# Patient Record
Sex: Female | Born: 1945
Health system: Southern US, Community
[De-identification: ages and names within clinical notes are randomized; demographics above are authoritative.]

## PROBLEM LIST (undated history)

## (undated) DIAGNOSIS — J45909 Unspecified asthma, uncomplicated: Secondary | ICD-10-CM

## (undated) DIAGNOSIS — E119 Type 2 diabetes mellitus without complications: Secondary | ICD-10-CM

## (undated) DIAGNOSIS — R600 Localized edema: Secondary | ICD-10-CM

## (undated) DIAGNOSIS — R7303 Prediabetes: Secondary | ICD-10-CM

## (undated) DIAGNOSIS — G2581 Restless legs syndrome: Secondary | ICD-10-CM

## (undated) DIAGNOSIS — E785 Hyperlipidemia, unspecified: Secondary | ICD-10-CM

## (undated) DIAGNOSIS — I10 Essential (primary) hypertension: Secondary | ICD-10-CM

## (undated) DIAGNOSIS — F419 Anxiety disorder, unspecified: Secondary | ICD-10-CM

## (undated) DIAGNOSIS — G35 Multiple sclerosis: Secondary | ICD-10-CM

## (undated) HISTORY — PX: OTHER SURGICAL HISTORY: SHX169

## (undated) HISTORY — DX: Localized edema: R60.0

## (undated) HISTORY — DX: Type 2 diabetes mellitus without complications: E11.9

## (undated) HISTORY — DX: Unspecified asthma, uncomplicated: J45.909

## (undated) HISTORY — DX: Hyperlipidemia, unspecified: E78.5

## (undated) HISTORY — DX: Restless legs syndrome: G25.81

## (undated) HISTORY — DX: Anxiety disorder, unspecified: F41.9

## (undated) HISTORY — DX: Prediabetes: R73.03

## (undated) HISTORY — DX: Multiple sclerosis: G35

---

## 1980-03-11 HISTORY — PX: TUBAL LIGATION: SHX77

## 1998-02-15 ENCOUNTER — Encounter: Payer: Self-pay | Admitting: Emergency Medicine

## 1998-02-15 ENCOUNTER — Inpatient Hospital Stay (HOSPITAL_COMMUNITY): Admission: EM | Admit: 1998-02-15 | Discharge: 1998-02-18 | Payer: Self-pay | Admitting: Emergency Medicine

## 1998-02-28 ENCOUNTER — Ambulatory Visit (HOSPITAL_COMMUNITY): Admission: RE | Admit: 1998-02-28 | Discharge: 1998-02-28 | Payer: Self-pay | Admitting: *Deleted

## 1998-11-25 ENCOUNTER — Encounter: Payer: Self-pay | Admitting: Emergency Medicine

## 1998-11-25 ENCOUNTER — Emergency Department (HOSPITAL_COMMUNITY): Admission: EM | Admit: 1998-11-25 | Discharge: 1998-11-25 | Payer: Self-pay | Admitting: Emergency Medicine

## 1999-04-09 ENCOUNTER — Encounter: Payer: Self-pay | Admitting: Orthopedic Surgery

## 1999-04-09 ENCOUNTER — Ambulatory Visit (HOSPITAL_COMMUNITY): Admission: RE | Admit: 1999-04-09 | Discharge: 1999-04-09 | Payer: Self-pay | Admitting: Orthopedic Surgery

## 1999-09-03 ENCOUNTER — Encounter: Admission: RE | Admit: 1999-09-03 | Discharge: 1999-09-03 | Payer: Self-pay | Admitting: *Deleted

## 1999-09-03 ENCOUNTER — Encounter: Payer: Self-pay | Admitting: *Deleted

## 2000-05-08 ENCOUNTER — Ambulatory Visit (HOSPITAL_COMMUNITY): Admission: RE | Admit: 2000-05-08 | Discharge: 2000-05-08 | Payer: Self-pay | Admitting: *Deleted

## 2000-05-08 ENCOUNTER — Encounter (INDEPENDENT_AMBULATORY_CARE_PROVIDER_SITE_OTHER): Payer: Self-pay | Admitting: *Deleted

## 2003-03-08 ENCOUNTER — Ambulatory Visit (HOSPITAL_COMMUNITY): Admission: RE | Admit: 2003-03-08 | Discharge: 2003-03-08 | Payer: Self-pay | Admitting: Orthopedic Surgery

## 2003-03-12 HISTORY — PX: BUNIONECTOMY: SHX129

## 2003-07-24 ENCOUNTER — Emergency Department (HOSPITAL_COMMUNITY): Admission: EM | Admit: 2003-07-24 | Discharge: 2003-07-24 | Payer: Self-pay | Admitting: Family Medicine

## 2003-11-23 ENCOUNTER — Encounter: Admission: RE | Admit: 2003-11-23 | Discharge: 2003-11-23 | Payer: Self-pay | Admitting: Cardiology

## 2005-03-15 ENCOUNTER — Emergency Department (HOSPITAL_COMMUNITY): Admission: EM | Admit: 2005-03-15 | Discharge: 2005-03-15 | Payer: Self-pay | Admitting: Family Medicine

## 2005-07-26 ENCOUNTER — Emergency Department (HOSPITAL_COMMUNITY): Admission: EM | Admit: 2005-07-26 | Discharge: 2005-07-26 | Payer: Self-pay | Admitting: Family Medicine

## 2006-03-06 ENCOUNTER — Encounter: Admission: RE | Admit: 2006-03-06 | Discharge: 2006-03-06 | Payer: Self-pay | Admitting: Obstetrics and Gynecology

## 2006-03-11 HISTORY — PX: PARTIAL HYSTERECTOMY: SHX80

## 2007-05-28 ENCOUNTER — Encounter: Payer: Self-pay | Admitting: Gastroenterology

## 2008-09-17 ENCOUNTER — Emergency Department (HOSPITAL_COMMUNITY): Admission: EM | Admit: 2008-09-17 | Discharge: 2008-09-17 | Payer: Self-pay | Admitting: Family Medicine

## 2009-04-24 ENCOUNTER — Encounter: Admission: RE | Admit: 2009-04-24 | Discharge: 2009-04-24 | Payer: Self-pay | Admitting: Orthopedic Surgery

## 2009-06-22 ENCOUNTER — Ambulatory Visit (HOSPITAL_COMMUNITY)
Admission: RE | Admit: 2009-06-22 | Discharge: 2009-06-23 | Payer: Self-pay | Source: Home / Self Care | Admitting: Orthopedic Surgery

## 2009-07-25 ENCOUNTER — Encounter (INDEPENDENT_AMBULATORY_CARE_PROVIDER_SITE_OTHER): Payer: Self-pay | Admitting: *Deleted

## 2009-09-01 ENCOUNTER — Ambulatory Visit: Payer: Self-pay | Admitting: Gastroenterology

## 2009-09-13 ENCOUNTER — Telehealth (INDEPENDENT_AMBULATORY_CARE_PROVIDER_SITE_OTHER): Payer: Self-pay | Admitting: *Deleted

## 2009-10-23 ENCOUNTER — Encounter: Admission: RE | Admit: 2009-10-23 | Discharge: 2009-10-23 | Payer: Self-pay | Admitting: Obstetrics and Gynecology

## 2010-04-10 NOTE — Procedures (Signed)
Summary: Colonoscopy/[facility]  Colonoscopy/[facility]   Imported By: Sherian Rein 09/19/2009 10:32:04  _____________________________________________________________________  External Attachment:    Type:   Image     Comment:   External Document

## 2010-04-10 NOTE — Procedures (Signed)
Summary: Colon   Colonoscopy  Procedure date:  05/08/2000  Findings:      Location:  [facility].   Patient Name: Melinda Mckay, Melinda Mckay MRN:  Procedure Procedures: Colonoscopy CPT: [address].    with Hot Biopsy(s)CPT: [id number].  Personnel: Endoscopist: Dortha Kern, MD.  Exam Location: Exam performed in Endoscopy Suite. Outpatient  Patient Consent: Procedure, Alternatives, Risks and Benefits discussed, consent obtained, from patient.  Indications  Evaluation of: Polyps seen on prior Colonoscopy.  History Allergies: No known allergies.  Patient Habits Patient does not smoke.  Pre-Exam Physical: Performed May 01, 2000. Cardio-pulmonary exam, Rectal exam, HEENT exam , Abdominal exam, Extremity exam, Neurological exam, Mental status exam WNL.  Exam Exam: Extent of exam reached: Cecum, extent intended: Cecum.  ASA Classification: I. Tolerance: excellent.  Monitoring: Pulse and BP monitoring, Oximetry used. Supplemental O2 given.  Colon Prep Used Golytely for colon prep. Prep results: excellent.  Fluoroscopy: Fluoroscopy was not used.  Sedation Meds: Demerol 100 mg. Versed 10 mg.  Findings - DIVERTICULOSIS: Anus to Cecum. Not bleeding.  POLYP: Rectum, Maximum size: 3 mm. sessile polyp. Distance from Anus 10-15 cm. removed, retrieved, Polyp sent to pathology.  POLYP: Rectum, Maximum size: 3 mm. sessile polyp. Distance from Anus 20 cm. removed, retrieved,   Assessment Abnormal examination, see findings above.  Diagnoses: 562.10: Diverticulosis. Rectal-sigmoid to transverse colon.  211.3: Colon Polyps. Rectal area.   Events  Unplanned Interventions: No intervention was required.  Unplanned Events: There were no complications. Plans  Post Exam Instructions: Nothing to eat or drink for one hour. Resume previous diet: two hours.  Medication Plan: Await pathology.  Patient Education: Patient given standard instructions for: Polyps. Diverticulosis.    Disposition: After procedure patient sent to recovery. After recovery patient sent home.  Scheduling/Referral: Office Visit, to Dortha Kern, MD, around May 21, 2000.  Colonoscopy, to Dortha Kern, MD, around May 08, 2001.

## 2010-04-10 NOTE — Letter (Signed)
Summary: New Patient letter  [facility]  [address] [place], [place] [address]   Phone: [phone]  Fax: [phone]       07/25/2009 MRN: [id number]  [facility] [address] [place], [place]  [address]  Dear Ms. Harvest Forest,  Welcome to the Gastroenterology Division at [facility].    You are scheduled to see Dr.  Christella Hartigan on 09-01-09 at 1:30pm on the 3rd floor at [facility], [address]  We ask that you try to arrive at our office 15 minutes prior to your appointment time to allow for check-in.  We would like you to complete the enclosed self-administered evaluation form prior to your visit and bring it with you on the day of your appointment.  We will review it with you.  Also, please bring a complete list of all your medications or, if you prefer, bring the medication bottles and we will list them.  Please bring your insurance card so that we may make a copy of it.  If your insurance requires a referral to see a specialist, please bring your referral form from your primary care physician.  Co-payments are due at the time of your visit and may be paid by cash, check or credit card.     Your office visit will consist of a consult with your physician (includes a physical exam), any laboratory testing he/she may order, scheduling of any necessary diagnostic testing (e.g. x-ray, ultrasound, CT-scan), and scheduling of a procedure (e.g. Endoscopy, Colonoscopy) if required.  Please allow enough time on your schedule to allow for any/all of these possibilities.    If you cannot keep your appointment, please call [phone] to cancel or reschedule prior to your appointment date.  This allows [place] the opportunity to schedule an appointment for another patient in need of care.  If you do not cancel or reschedule by 5 p.m. the business day prior to your appointment date, you will be charged a $50.00 late cancellation/no-show fee.    Thank you for  choosing [facility] for your medical needs.  We appreciate the opportunity to care for you.  Please visit [place] at our website  to learn more about our practice.                     Sincerely,                                                             The Gastroenterology Division

## 2010-04-10 NOTE — Progress Notes (Signed)
  Phone Note Other Incoming   Request: Send information Summary of Call: Records received from [facility]. 3 pages forwarded to Dr. Christella Hartigan for review.

## 2010-04-10 NOTE — Assessment & Plan Note (Signed)
History of Present Illness Visit Type: Initial Consult Primary GI MD: Rob Bunting MD Primary Provider: Chryl Heck Requesting Provider: Chryl Heck Chief Complaint: abdominal pain History of Present Illness:     very pleasant 65 year old woman who is a patient of Dr. Charlott Rakes, [facility] gastroenterology.   Was taking percocet for foot pain following a foot surgery.  She became constipated.  Stool dulcolax, stool softeners, still getting lower abd apins. Tried MOM, resulted in very good BM.  The pains are intermittent, still occur periodically.  The pains could last for hours (during very severe pains) when she was very constipated.  Never had bloody BM.  The pains can be very intense, waxing and waning.   Has not noted.  She had a colonoscopy with Dr. Doy Mince 2 years ago, she believes polyps were removed.  She believes she was told to have a repeat at 5 year interval.   She had diverticulitis in 2000, was hospitalized.  She had blood in her urine years ago, evaluated by a urologist.  Eventually by a general surgeon for what sounds like gallstones, Was told not to worry about them since they weren't causing any symptoms.           Current Medications (verified): 1)  Hydroxyzine Hcl 25 Mg Tabs (Hydroxyzine Hcl) .[name].. 1 By Mouth Once Daily 2)  Metoclopramide Hcl 10 Mg Tabs (Metoclopramide Hcl) .[name].. 1 By Mouth Once Daily 3)  Nexium 40 Mg Cpdr (Esomeprazole Magnesium) .... As Needed 4)  Aspirin 81 Mg Tbec (Aspirin) .[name].. 1 By Mouth Once Daily 5)  Calcium Lactate 750 Mg Tabs (Calcium Lactate) .... 2 By Mouth Once Daily 6)  Fish Oil 300 Mg Caps (Omega-3 Fatty Acids) .[name].. 1 By Mouth Once Daily 7)  Vitamin D 1000 Unit Tabs (Cholecalciferol) .[name].. 1 By Mouth Once Daily 8)  Simvastatin 40 Mg Tabs (Simvastatin) .[name].. 1 By Mouth Once Daily 9)  Exforge Hct 10-320-25 Mg Tabs (Amlodipine-Valsartan-Hctz) .[name].. 1 By Mouth Once Daily 10)  Vitamin B-12 250 Mcg Tabs  (Cyanocobalamin) .[name].. 1 By Mouth Once Daily  Allergies (verified): No Known Drug Allergies  Past History:  Past Medical History: Hypertension Obesity GERD Hyperlipidemia gallstones? 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  All other review of systems was otherwise negative.   Vital Signs:  Patient profile:   65 year old female Height:      66 inches Weight:      185 pounds BMI:     29.97 BSA:     1.94 Pulse rate:   64 / minute Pulse rhythm:   regular BP sitting:   122 / 72  (left arm)  Vitals Entered By: Merri Ray CMA (AAMA) (September 01, 2009 1:20 PM)  Physical Exam  Additional Exam:  Constitutional: generally well appearing Psychiatric: alert and oriented times 3 Eyes: extraocular movements intact Mouth: oropharynx moist, no lesions Neck: supple, no lymphadenopathy Cardiovascular: heart regular rate and rythm Lungs: CTA bilaterally Abdomen: soft, non-tender, non-distended, no obvious ascites, no peritoneal signs, normal bowel sounds Extremities: no lower extremity edema bilaterally Skin: no  lesions on visible extremities    Impression & Recommendations:  Problem # 1:  recent constipation, lower abdominal discomforts she was on narcotic pain medicines, this caused her to be constipated, this in turn caused lower abdominal discomforts. Her constipation has improved since she stopped taking narcotic pain medicines however she is still left with intermittent lower abdominal pains and I suspect are related to perhaps residual mild constipation  or simply interval bowel like phenomenon.  she has seen no overt GI bleeding and had a colonoscopy about 2 years ago with another gastroenterologist, Dr. Bosie Clos.  We will get those records sent over for review. It sounds like she may have a colonoscopy at five-year interval and we'll put her in our reminder system for that.  I have given her a prescription for antispasmodics which she will take one to 2 as needed. I also recommended she stay on high-fiber diet and keep her fluid status up.  Problem # 2:  gallstones? we will get records from her general surgical evaluation from Dr. Freida Busman. Sounds like she has had gallstones documented in the past. I do not think her recent abdominal pains are at all biliary related however.  Patient Instructions: 1)  We will contact Dr. Marge Duncans office about your most recent colonoscopy in 2009.  Will review this and also put your in our reminder system for colonoscopy at the proper interval. 2)  Antispasmodic medicine was called in, take one to two pills every 6 hours as needed. 3)  We will contact Dr. Joice Lofts Allen's office for office notes on ?gallstones in 2008. 4)  Stay well hydrated. 5)  Eat high fiber diet or take fiber supplement (citrucel, orange flavored). 6)  A copy of this information will be sent to Dr. Shana Chute. 7)  The medication list was reviewed and reconciled.  All changed / newly prescribed medications were explained.  A complete medication list was provided to the patient / caregiver.  Appended Document: Levsin    Clinical Lists Changes  Medications: Added new medication of LEVSIN/SL 0.125 MG SUBL (HYOSCYAMINE SULFATE) 1-2 every 6 hours as needed - Signed Rx of LEVSIN/SL 0.125 MG SUBL (HYOSCYAMINE SULFATE) 1-2 every 6 hours as needed;  #50 x 2;  Signed;  Entered by: Chales Abrahams CMA (AAMA);  Authorized by: Rachael Fee MD;  Method used: Electronically to [facility] [phone] [place], [place],  [place]  [address], Ph: [phone], Fax: [phone]    Prescriptions: LEVSIN/SL 0.125 MG SUBL (HYOSCYAMINE SULFATE) 1-2 every 6 hours as needed  #50 x 2   Entered by:   Chales Abrahams CMA (AAMA)   Authorized by:   Rachael Fee MD   Signed by:   Chales Abrahams CMA (AAMA) on 09/01/2009   Method used:   Electronically to        [facility] [address] (retail)       [phone]       [place], [place]  [address]       Ph: [phone]       Fax: [phone]   RxID:   [id number]    Appended Document:  patty, can you call Dr. Marge Duncans office for the pathology results from 05/2007 colonoscopy biopsies;  the colonoscopy report stated that a diminutive polyp was removed with forceps, otherwise colon had diverticulosis, melanosis.  Appended Document:  Dr Louis Matte office will send the path today  Appended Document:  path report  showed t hyperplastic polyps.  patty, can you put her in for recall colonoscopy 05/2017  Appended Document: Colon recall    Clinical Lists Changes  Observations: Added new observation of COLONNXTDUE: 05/2017 (09/19/2009 12:42)      Appended Document:  recall in IDX and EMR

## 2010-05-30 LAB — URINALYSIS, ROUTINE W REFLEX MICROSCOPIC
Glucose, UA: NEGATIVE mg/dL
Nitrite: NEGATIVE
Protein, ur: NEGATIVE mg/dL
Specific Gravity, Urine: 1.018 (ref 1.005–1.030)

## 2010-05-30 LAB — COMPREHENSIVE METABOLIC PANEL
ALT: 25 U/L (ref 0–35)
Albumin: 3.9 g/dL (ref 3.5–5.2)
CO2: 31 mEq/L (ref 19–32)
GFR calc non Af Amer: >60 mL/min (ref >60)
Glucose, Bld: 102 mg/dL — ABNORMAL HIGH (ref 70–99)
Potassium: 3.6 mEq/L (ref 3.5–5.1)
Sodium: 139 mEq/L (ref 135–145)
Total Bilirubin: 0.3 mg/dL (ref 0.3–1.2)
Total Protein: 7.5 g/dL (ref 6.0–8.3)

## 2010-05-30 LAB — CBC
RBC: 5.02 MIL/uL (ref 3.87–5.11)
WBC: 5.1 10*3/uL (ref 4.0–10.5)

## 2010-05-30 LAB — URINE MICROSCOPIC-ADD ON

## 2010-07-27 NOTE — H&P (Signed)
NAME:  Melinda Mckay, Melinda Mckay                       ACCOUNT NO.:  [id number]   MEDICAL RECORD NO.:  [id number]                   PATIENT TYPE:  OIB   LOCATION:  NA                                   FACILITY:  [facility]   PHYSICIAN:  Myrtie Neither, M.D.                 DATE OF BIRTH:  1945-03-19   DATE OF ADMISSION:  DATE OF DISCHARGE:                                HISTORY & PHYSICAL   CHIEF COMPLAINT:  Severe painful right little toe.   HISTORY OF PRESENT ILLNESS:  This is a 65 year old female who has been  followed for a dorsal callosity with spur formation of the right fifth toe  over the past several months.  The patient has had some troubles with  closing shoes, has been using callus pads, but the pain persists, as well as  enlargement of the callosity.  The patient states at times the pain runs up  the foot and ankle area and makes it difficult to walk.   PAST MEDICAL HISTORY:  1. High blood pressure.  2. Hiatal hernia with reflux.  3. Infection of symphysis pubis.   ALLERGIES:  No known drug allergies.   MEDICATIONS:  1. Norvasc 5 mg daily.  2. Zestoretic 25 mg daily.  3. ___________.  4. Multivitamin.   SOCIAL HISTORY:  The patient lives alone.  No history of use of alcohol or  smoking.  The patient has two daughters.   REVIEW OF SYSTEMS:  That of some symptoms from her hiatal hernia and reflux.  No cardiorespiratory symptoms, no urinary or bowel symptoms.   FAMILY HISTORY:  Noncontributory.   PHYSICAL EXAMINATION:  VITAL SIGNS:  Temperature 97.3, pulse 64,  respirations 16, blood pressure 142/73, height 66 inches, weight 202.  HEENT:  Normocephalic.  Sclerae clear.  NECK:  Supple.  CHEST:  Clear.  CARDIAC:  S1 and S2 regular.  EXTREMITIES:  Right foot with large dorsal callosity with osteophyte of the  head of the phalanx, markedly tender to palpation.   IMPRESSION:  Spur, right fifth toe proximal phalanx with dorsal callosity.   PLAN:  Phalangectomy, right fifth  toe.                                                Myrtie Neither, M.D.    AC/MEDQ  D:  03/08/2003  T:  03/08/2003  Job:  [id number]

## 2010-07-27 NOTE — Op Note (Signed)
NAME:  Melinda Mckay, Melinda Mckay                       ACCOUNT NO.:  [id number]   MEDICAL RECORD NO.:  [id number]                   PATIENT TYPE:  OIB   LOCATION:  NA                                   FACILITY:  [facility]   PHYSICIAN:  Myrtie Neither, M.D.                 DATE OF BIRTH:  12/21/1945   DATE OF PROCEDURE:  03/08/2003  DATE OF DISCHARGE:                                 OPERATIVE REPORT   PREOPERATIVE DIAGNOSIS:  Spur proximal phalanx, right fifth toe, with dorsal  callosity.   POSTOPERATIVE DIAGNOSIS:  Spur proximal phalanx, right fifth toe, with  dorsal callosity.   ANESTHESIA:  General.   PROCEDURE:  Proximal phalangectomy, right fifth toe.   DESCRIPTION OF PROCEDURE:  The patient was taken to the operating room.  After given adequate IV pre-medication, given general anesthesia and  intubated.  The right foot was prepped with Duraprep and draped in a sterile  manner.  Tourniquet bipolar to achieve hemostasis.  A dorsal incision was  made over the right fifth toe, going through the skin and subcutaneous  tissue down to the large osteophyte of the lateral condyle of the proximal  phalanx.  Sharp and blunt dissection made with removal of the soft tissue  and resection of the phalanx was done.  Irrigation with saline was then  done.  Wound closure was then done with 4-0 nylon.  A bulky compressive  dressing was applied and fracture shoe applied.  The patient tolerated the  procedure quite well.  Went to recovery room in stable and satisfactory  condition.  The patient is being discharged home to use the fracture shoe,  ice packs, elevation.  Percocet one to two p.o. q.4h. p.r.n. for pain, and  to return to the office in one week.  The patient is being discharged in  stable and satisfactory condition.                                               Myrtie Neither, M.D.    AC/MEDQ  D:  03/08/2003  T:  03/08/2003  Job:  [id number]

## 2010-10-05 ENCOUNTER — Other Ambulatory Visit: Payer: Self-pay | Admitting: Obstetrics and Gynecology

## 2010-10-05 DIAGNOSIS — Z1231 Encounter for screening mammogram for malignant neoplasm of breast: Secondary | ICD-10-CM

## 2010-10-24 ENCOUNTER — Other Ambulatory Visit: Payer: Self-pay | Admitting: Gastroenterology

## 2010-10-25 ENCOUNTER — Ambulatory Visit
Admission: RE | Admit: 2010-10-25 | Discharge: 2010-10-25 | Disposition: A | Discharge status: Home / Self Care | Payer: PRIVATE HEALTH INSURANCE | Source: Ambulatory Visit | Attending: Obstetrics and Gynecology | Admitting: Obstetrics and Gynecology

## 2010-10-25 DIAGNOSIS — Z1231 Encounter for screening mammogram for malignant neoplasm of breast: Secondary | ICD-10-CM

## 2011-03-06 ENCOUNTER — Encounter: Payer: Self-pay | Admitting: *Deleted

## 2011-03-06 ENCOUNTER — Emergency Department (INDEPENDENT_AMBULATORY_CARE_PROVIDER_SITE_OTHER)
Admission: EM | Admit: 2011-03-06 | Discharge: 2011-03-06 | Disposition: A | Discharge status: Home / Self Care | Payer: PRIVATE HEALTH INSURANCE | Source: Home / Self Care | Attending: Family Medicine | Admitting: Family Medicine

## 2011-03-06 DIAGNOSIS — L508 Other urticaria: Secondary | ICD-10-CM

## 2011-03-06 HISTORY — DX: Essential (primary) hypertension: I10

## 2011-03-06 MED ORDER — CETIRIZINE HCL 10 MG PO TABS: 10.0000 mg | ORAL_TABLET | Freq: Every day | ORAL | Status: DC

## 2011-03-06 MED ORDER — HYDROXYZINE HCL 25 MG PO TABS: 25.0000 mg | ORAL_TABLET | Freq: Four times a day (QID) | ORAL | Status: AC

## 2011-03-06 NOTE — ED Provider Notes (Signed)
History     CSN: [id number]  Arrival date & time 03/06/11  1747   First MD Initiated Contact with Patient 03/06/11 1754      Chief Complaint  Patient presents with  . Rash    (Consider location/radiation/quality/duration/timing/severity/associated sxs/prior treatment) Patient is a 65 y.o. female presenting with rash. The history is provided by the patient.  Rash  This is a recurrent problem. The current episode started 12 to 24 hours ago. The problem has been gradually worsening. The problem is associated with an unknown factor. There has been no fever. Affected Location: entire body itching. The patient is experiencing no pain. Associated symptoms include itching. She has tried nothing (h/o atarax helping similar sx.) for the symptoms.    Past Medical History  Diagnosis Date  . Hypertension   . Allergic     History reviewed. No pertinent past surgical history.  History reviewed. No pertinent family history.  History  Substance Use Topics  . Smoking status: Never Smoker   . Smokeless tobacco: Not on file  . Alcohol Use: Yes    OB History    Grav Para Term Preterm Abortions TAB SAB Ect Mult Living                  Review of Systems  Constitutional: Negative.   HENT: Negative.   Skin: Positive for itching. Negative for rash.    Allergies  Review of patient's allergies indicates no known allergies.  Home Medications   Current Outpatient Rx  Name Route Sig Dispense Refill  . AMLODIPINE BESYLATE-VALSARTAN 10-320 MG PO TABS Oral Take 1 tablet by mouth daily.      . ASPIRIN 81 MG PO CHEW Oral Chew 81 mg by mouth daily.      [name] CALCIUM CARBONATE 200 MG PO CAPS Oral Take 750 mg by mouth 2 (two) times daily with a meal.      . ESOMEPRAZOLE MAGNESIUM 40 MG PO CPDR Oral Take 40 mg by mouth daily before breakfast.      . HYDROXYZINE HCL 25 MG PO TABS Oral Take 25 mg by mouth 3 (three) times daily as needed.      [name] METOCLOPRAMIDE HCL 10 MG PO TABS Oral Take 10 mg by  mouth 4 (four) times daily.      [name] CETIRIZINE HCL 10 MG PO TABS Oral Take 1 tablet (10 mg total) by mouth daily. 30 tablet 1  . HYDROXYZINE HCL 25 MG PO TABS Oral Take 1 tablet (25 mg total) by mouth every 6 (six) hours. 30 tablet 1  . HYOSCYAMINE SULFATE 0.125 MG SL SUBL  DISSOLVE ONE TO TWO TABLETS EVERY 6 HOURS AS NEEDED 50 tablet 3    BP 154/83  Pulse 53  Temp(Src) 98.6 F (37 C) (Oral)  Resp 16  SpO2 100%  Physical Exam  Nursing note and vitals reviewed. Constitutional: She is oriented to person, place, and time. She appears well-developed and well-nourished.  HENT:  Mouth/Throat: Oropharynx is clear and moist.  Neck: Normal range of motion. Neck supple.  Cardiovascular: Normal rate, normal heart sounds and intact distal pulses.   Pulmonary/Chest: Effort normal and breath sounds normal.  Neurological: She is alert and oriented to person, place, and time.  Skin: Rash noted. Rash is urticarial. There is erythema.       ED Course  Procedures (including critical care time)  Labs Reviewed - No data to display No results found.   1. Urticaria, chronic  MDM          Barkley Bruns, MD 03/06/11 2002

## 2011-03-06 NOTE — ED Notes (Signed)
PT  HAS  HAS  CHRONIC  RASH  WHICH  WAS  ATTRIBUTED  TO BP  MEDS  WHICH  SHE  STOPPED  IN PAST   SHE NOW  REPORTS  ITCINING   AND  SHE  HAS  RAN  OUT OF  HYDROXIZINE  WHICH  SHE  REPORTS  USUALLY  WORKS     NO  SEVERE  DISTRESS  NO  ANGIO EDEMA

## 2011-08-21 ENCOUNTER — Ambulatory Visit
Admission: RE | Admit: 2011-08-21 | Discharge: 2011-08-21 | Disposition: A | Discharge status: Home / Self Care | Payer: Medicare Other | Source: Ambulatory Visit | Attending: Cardiology | Admitting: Cardiology

## 2011-08-21 ENCOUNTER — Other Ambulatory Visit (HOSPITAL_COMMUNITY): Payer: Self-pay | Admitting: Cardiology

## 2011-08-21 ENCOUNTER — Other Ambulatory Visit: Payer: Self-pay | Admitting: Cardiology

## 2011-08-21 DIAGNOSIS — R0602 Shortness of breath: Secondary | ICD-10-CM

## 2011-08-21 DIAGNOSIS — R062 Wheezing: Secondary | ICD-10-CM

## 2011-08-21 DIAGNOSIS — R05 Cough: Secondary | ICD-10-CM | POA: Diagnosis not present

## 2011-08-27 ENCOUNTER — Ambulatory Visit (HOSPITAL_COMMUNITY)
Admission: RE | Admit: 2011-08-27 | Discharge: 2011-08-27 | Disposition: A | Discharge status: Home / Self Care | Payer: Medicare Other | Source: Ambulatory Visit | Attending: Cardiology | Admitting: Cardiology

## 2011-08-27 DIAGNOSIS — R0602 Shortness of breath: Secondary | ICD-10-CM | POA: Diagnosis not present

## 2011-08-27 MED ORDER — ALBUTEROL SULFATE (5 MG/ML) 0.5% IN NEBU
2.5000 mg | INHALATION_SOLUTION | Freq: Once | RESPIRATORY_TRACT | Status: AC
  Administered 2011-08-27: 2.5 mg via RESPIRATORY_TRACT

## 2011-10-02 ENCOUNTER — Other Ambulatory Visit: Payer: Self-pay | Admitting: Obstetrics and Gynecology

## 2011-10-02 DIAGNOSIS — Z1231 Encounter for screening mammogram for malignant neoplasm of breast: Secondary | ICD-10-CM

## 2011-10-28 ENCOUNTER — Ambulatory Visit: Payer: Medicare Other

## 2011-11-04 DIAGNOSIS — Z0000 Encounter for general adult medical examination without abnormal findings: Secondary | ICD-10-CM | POA: Diagnosis not present

## 2011-11-04 DIAGNOSIS — Z01419 Encounter for gynecological examination (general) (routine) without abnormal findings: Secondary | ICD-10-CM | POA: Diagnosis not present

## 2011-11-08 ENCOUNTER — Ambulatory Visit
Admission: RE | Admit: 2011-11-08 | Discharge: 2011-11-08 | Disposition: A | Discharge status: Home / Self Care | Payer: Medicare Other | Source: Ambulatory Visit | Attending: Obstetrics and Gynecology | Admitting: Obstetrics and Gynecology

## 2011-11-08 DIAGNOSIS — Z1231 Encounter for screening mammogram for malignant neoplasm of breast: Secondary | ICD-10-CM

## 2011-12-11 ENCOUNTER — Encounter: Payer: Self-pay | Admitting: *Deleted

## 2011-12-12 ENCOUNTER — Encounter: Payer: Self-pay | Admitting: Pulmonary Disease

## 2011-12-12 ENCOUNTER — Ambulatory Visit (INDEPENDENT_AMBULATORY_CARE_PROVIDER_SITE_OTHER): Payer: Medicare Other | Admitting: Pulmonary Disease

## 2011-12-12 VITALS — BP 122/70 | HR 67 | Temp 98.0°F | Ht 65.0 in | Wt 198.6 lb

## 2011-12-12 DIAGNOSIS — J45909 Unspecified asthma, uncomplicated: Secondary | ICD-10-CM | POA: Diagnosis not present

## 2011-12-12 DIAGNOSIS — J4530 Mild persistent asthma, uncomplicated: Secondary | ICD-10-CM | POA: Insufficient documentation

## 2011-12-12 MED ORDER — MOMETASONE FUROATE 220 MCG/INH IN AEPB: 2.0000 | INHALATION_SPRAY | Freq: Every day | RESPIRATORY_TRACT | Status: DC

## 2011-12-12 NOTE — Assessment & Plan Note (Signed)
The patient clearly has mild airflow obstruction that is completely reversible with bronchodilator.  She has a history of smoking, but is very limited in nature.  With her complete reversible airflow obstruction, along with her history, I suspect this represents asthma.  I have had a long discussion with her about the pathophysiology of asthma, and Haldol she will need daily treatment with an anti-inflammatory medication.  This will need to be inhaled corticosteroids.  I will start the patient on Asmanex, and also provide her with a rescue metered-dose inhaler.

## 2011-12-12 NOTE — Addendum Note (Signed)
Addended by: Ozella Almond R on: 12/12/2011 11:16 AM   Modules accepted: Orders

## 2011-12-12 NOTE — Patient Instructions (Addendum)
Stop atrovent nebulizer Will treat with asmanex inhaler, 2 inhalations at bedtime each night.  Rinse mouth out well after using. Albuterol inhaler, 2 puffs up to every 6hrs if needed for rescue/emergencies only. followup with me in 8 weeks.

## 2011-12-12 NOTE — Progress Notes (Signed)
  Subjective:    Patient ID: Melinda Mckay, female    DOB: 1945/05/06, 66 y.o.   MRN: [id number]  HPI The patient is a very pleasant 66 year old female who I've been asked to see for possible asthma.  The patient states the last one year, that she has been having periodic wheezing, especially noted by her family members.  This has been self-limiting, and the patient denies any issues with dyspnea on exertion.  However, she admits that she does not do heavy exertional activities or a regular exercise program.  She has also been noted to have wheezing at night with an abnormal respiratory pattern along with snoring.  She notes severe coughing and dyspnea when she gets an upper respiratory infection.  The patient has a strong family history for asthma, with all of her siblings being diagnosed.  She has had recent pulmonary function studies that showed mild airflow obstruction, and a 19% improvement in her FEV1 with bronchodilator.  Her postbronchodilator spirometry was normal.  Lung volumes showed significant air trapping.  Recent chest x-ray shows no acute process.   Review of Systems  Constitutional: Negative for fever and unexpected weight change.  HENT: Negative for ear pain, nosebleeds, congestion, sore throat, rhinorrhea, sneezing, trouble swallowing, dental problem, postnasal drip and sinus pressure.   Eyes: Negative for redness and itching.  Respiratory: Positive for shortness of breath and wheezing. Negative for cough and chest tightness.   Cardiovascular: Negative for palpitations and leg swelling.  Gastrointestinal: Negative for nausea and vomiting.  Genitourinary: Negative for dysuria.  Musculoskeletal: Negative for joint swelling.  Skin: Negative for rash.  Neurological: Negative for headaches.  Hematological: Does not bruise/bleed easily.  Psychiatric/Behavioral: Negative for dysphoric mood. The patient is not nervous/anxious.        Objective:   Physical Exam Constitutional:   Well developed, no acute distress  HENT:  Nares patent without discharge  Oropharynx without exudate, palate and uvula are elongated.   Eyes:  Perrla, eomi, no scleral icterus  Neck:  No JVD, no TMG  Cardiovascular:  Normal rate, regular rhythm, no rubs or gallops.  No murmurs        Intact distal pulses  Pulmonary :  Normal breath sounds, no stridor or respiratory distress   No rales, rhonchi, or wheezing  Abdominal:  Soft, nondistended, bowel sounds present.  No tenderness noted.   Musculoskeletal:  No lower extremity edema noted.  Lymph Nodes:  No cervical lymphadenopathy noted  Skin:  No cyanosis noted  Neurologic:  Alert, appropriate, moves all 4 extremities without obvious deficit. .       Assessment & Plan:

## 2012-01-26 ENCOUNTER — Emergency Department (HOSPITAL_BASED_OUTPATIENT_CLINIC_OR_DEPARTMENT_OTHER)
Admission: EM | Admit: 2012-01-26 | Discharge: 2012-01-26 | Disposition: A | Discharge status: Home / Self Care | Payer: Medicare Other | Attending: Emergency Medicine | Admitting: Emergency Medicine

## 2012-01-26 ENCOUNTER — Encounter (HOSPITAL_BASED_OUTPATIENT_CLINIC_OR_DEPARTMENT_OTHER): Payer: Self-pay | Admitting: *Deleted

## 2012-01-26 ENCOUNTER — Emergency Department (HOSPITAL_BASED_OUTPATIENT_CLINIC_OR_DEPARTMENT_OTHER): Payer: Medicare Other

## 2012-01-26 DIAGNOSIS — R059 Cough, unspecified: Secondary | ICD-10-CM | POA: Diagnosis not present

## 2012-01-26 DIAGNOSIS — IMO0002 Reserved for concepts with insufficient information to code with codable children: Secondary | ICD-10-CM | POA: Insufficient documentation

## 2012-01-26 DIAGNOSIS — J3489 Other specified disorders of nose and nasal sinuses: Secondary | ICD-10-CM | POA: Insufficient documentation

## 2012-01-26 DIAGNOSIS — Z79899 Other long term (current) drug therapy: Secondary | ICD-10-CM | POA: Insufficient documentation

## 2012-01-26 DIAGNOSIS — R05 Cough: Secondary | ICD-10-CM | POA: Insufficient documentation

## 2012-01-26 DIAGNOSIS — Z87891 Personal history of nicotine dependence: Secondary | ICD-10-CM | POA: Diagnosis not present

## 2012-01-26 DIAGNOSIS — I10 Essential (primary) hypertension: Secondary | ICD-10-CM | POA: Insufficient documentation

## 2012-01-26 DIAGNOSIS — R0602 Shortness of breath: Secondary | ICD-10-CM | POA: Diagnosis not present

## 2012-01-26 DIAGNOSIS — J45901 Unspecified asthma with (acute) exacerbation: Secondary | ICD-10-CM | POA: Diagnosis not present

## 2012-01-26 MED ORDER — ALBUTEROL SULFATE (5 MG/ML) 0.5% IN NEBU
5.0000 mg | INHALATION_SOLUTION | Freq: Once | RESPIRATORY_TRACT | Status: AC
  Administered 2012-01-26: 5 mg via RESPIRATORY_TRACT
  Filled 2012-01-26: qty 1

## 2012-01-26 MED ORDER — ALBUTEROL SULFATE (5 MG/ML) 0.5% IN NEBU: 5.0000 mg | INHALATION_SOLUTION | Freq: Once | RESPIRATORY_TRACT | Status: AC

## 2012-01-26 MED ORDER — IPRATROPIUM BROMIDE 0.02 % IN SOLN
0.5000 mg | Freq: Once | RESPIRATORY_TRACT | Status: DC
  Filled 2012-01-26: qty 2.5

## 2012-01-26 MED ORDER — PREDNISONE 20 MG PO TABS: 60.0000 mg | ORAL_TABLET | Freq: Every day | ORAL | Status: DC

## 2012-01-26 MED ORDER — IPRATROPIUM BROMIDE 0.02 % IN SOLN
0.5000 mg | Freq: Once | RESPIRATORY_TRACT | Status: AC
  Administered 2012-01-26: 0.5 mg via RESPIRATORY_TRACT
  Filled 2012-01-26: qty 2.5

## 2012-01-26 MED ORDER — PREDNISONE 50 MG PO TABS
60.0000 mg | ORAL_TABLET | Freq: Once | ORAL | Status: AC
  Administered 2012-01-26: 60 mg via ORAL
  Filled 2012-01-26: qty 1

## 2012-01-26 MED ORDER — IPRATROPIUM BROMIDE 0.02 % IN SOLN
0.5000 mg | Freq: Once | RESPIRATORY_TRACT | Status: AC
  Administered 2012-01-26: 0.5 mg via RESPIRATORY_TRACT

## 2012-01-26 MED ORDER — ALBUTEROL SULFATE HFA 108 (90 BASE) MCG/ACT IN AERS: 2.0000 | INHALATION_SPRAY | RESPIRATORY_TRACT | Status: DC | PRN

## 2012-01-26 NOTE — ED Notes (Signed)
Patient here with asthma exacerbation that started last night and today used her inhaler and it worked but she went to church and she started wheezing.  Respiratory intact, audible wheezing.    Patient took 0.5mg  atrovent about an hour ago

## 2012-01-26 NOTE — ED Provider Notes (Signed)
History     CSN: [id number]  Arrival date & time 01/26/12  1350   First MD Initiated Contact with Patient 01/26/12 1356      Chief Complaint  Patient presents with  . Shortness of Breath    (Consider location/radiation/quality/duration/timing/severity/associated sxs/prior treatment) Patient is a 66 y.o. female presenting with shortness of breath. The history is provided by the patient.  Shortness of Breath  Associated symptoms include cough and shortness of breath. Pertinent negatives include no chest pain and no fever.  pt c/o wheezing, sob for past day. States hx asthma, indicating was formally diagnosed w asthma earlier this year, but states felt as if had problems w wheezing for many years. Uses asmanex inhaler daily, but rarely if every uses albuterol. States used her albuterol inhaler this morning 2x, no alb use yesterday. Denies recent steroid use. Denies prior admission for asthma. Former smoker, quit approximately 20 yrs ago. Recent increased non productive cough and mild nasal congestion. No fever or chills. No chest pain. No leg pain or swelling. No orthopnea or pnd. Denies hx chf. Hx htn, takes meds for same, no recent change in meds or new meds.      Past Medical History  Diagnosis Date  . Hypertension   . Allergic   . Asthma     Past Surgical History  Procedure Date  . Total abdominal hysterectomy 2009  . Tubal ligation 1982    Family History  Problem Relation Age of Onset  . Cancer Sister     KIDNEY   . Allergies Sister   . Asthma Sister   . Allergies Brother   . Asthma Brother     History  Substance Use Topics  . Smoking status: Former Smoker -- 0.4 packs/day for 20 years    Types: Cigarettes    Quit date: 03/11/1993  . Smokeless tobacco: Not on file     Comment: PT STATES ABOUT 1 PACK A WEEK   . Alcohol Use: Yes    OB History    Grav Para Term Preterm Abortions TAB SAB Ect Mult Living                  Review of Systems  Constitutional:  Negative for fever and chills.  HENT: Negative for neck pain.   Eyes: Negative for redness.  Respiratory: Positive for cough and shortness of breath.   Cardiovascular: Negative for chest pain and leg swelling.  Gastrointestinal: Negative for abdominal pain.  Genitourinary: Negative for flank pain.  Musculoskeletal: Negative for back pain.  Skin: Negative for rash.  Neurological: Negative for headaches.  Hematological: Does not bruise/bleed easily.  Psychiatric/Behavioral: Negative for confusion.    Allergies  Review of patient's allergies indicates no known allergies.  Home Medications   Current Outpatient Rx  Name  Route  Sig  Dispense  Refill  . IPRATROPIUM BROMIDE 0.02 % IN SOLN   Nebulization   Take 500 mcg by nebulization 4 (four) times daily.         [name] AMLODIPINE BESYLATE-VALSARTAN 10-320 MG PO TABS   Oral   Take 1 tablet by mouth daily.           . ASPIRIN 81 MG PO CHEW   Oral   Chew 81 mg by mouth daily.           [name] CALCIUM CARBONATE 200 MG PO CAPS   Oral   Take 750 mg by mouth 2 (two) times daily with a meal.           .  CETIRIZINE HCL 10 MG PO TABS   Oral   Take 1 tablet (10 mg total) by mouth daily.   30 tablet   1   . ESOMEPRAZOLE MAGNESIUM 40 MG PO CPDR   Oral   Take 40 mg by mouth daily before breakfast.           . HYDROXYZINE HCL 25 MG PO TABS   Oral   Take 25 mg by mouth 3 (three) times daily as needed.           [name] HYOSCYAMINE SULFATE 0.125 MG SL SUBL      DISSOLVE ONE TO TWO TABLETS EVERY 6 HOURS AS NEEDED   50 tablet   3   . IPRATROPIUM BROMIDE 0.02 % IN SOLN   Nebulization   Take by nebulization once.         [name] METOCLOPRAMIDE HCL 10 MG PO TABS   Oral   Take 10 mg by mouth 4 (four) times daily.           . MOMETASONE FUROATE 220 MCG/INH IN AEPB   Inhalation   Inhale 2 puffs into the lungs daily.   1 Inhaler   6   . ROSUVASTATIN CALCIUM 10 MG PO TABS   Oral   Take 10 mg by mouth daily.           BP 175/94   Pulse 114  Temp 98.2 F (36.8 C)  Resp 24  SpO2 95%  Physical Exam  Nursing note and vitals reviewed. Constitutional: She is oriented to person, place, and time. She appears well-developed and well-nourished. No distress.  HENT:  Mouth/Throat: Oropharynx is clear and moist.  Eyes: Conjunctivae normal are normal. No scleral icterus.  Neck: Neck supple. No JVD present. No tracheal deviation present.  Cardiovascular: Regular rhythm, normal heart sounds and intact distal pulses.  Exam reveals no gallop and no friction rub.   No murmur heard. Pulmonary/Chest: Effort normal. No respiratory distress. She has wheezes.  Abdominal: Soft. Normal appearance. She exhibits no distension. There is no tenderness.  Musculoskeletal: She exhibits no edema and no tenderness.  Neurological: She is alert and oriented to person, place, and time.  Skin: Skin is warm and dry. No rash noted.  Psychiatric: She has a normal mood and affect.    ED Course  Procedures (including critical care time)  Results for orders placed during the hospital encounter of 06/22/09  CBC      Component Value Range   WBC 5.1  4.0 - 10.5 K/uL   RBC 5.02  3.87 - 5.11 MIL/uL   Hemoglobin 15.0  12.0 - 15.0 g/dL   HCT 16.1  09.6 - 04.5 %   MCV 90.0  78.0 - 100.0 fL   MCHC 33.2  30.0 - 36.0 g/dL   RDW 40.9  81.1 - 91.4 %   Platelets 276  150 - 400 K/uL  COMPREHENSIVE METABOLIC PANEL      Component Value Range   Sodium 139  135 - 145 mEq/L   Potassium 3.6  3.5 - 5.1 mEq/L   Chloride 103  96 - 112 mEq/L   CO2 31  19 - 32 mEq/L   Glucose, Bld 102 (*) 70 - 99 mg/dL   BUN 17  6 - 23 mg/dL   Creatinine, Ser 7.82  0.4 - 1.2 mg/dL   Calcium 95.6 (*) 8.4 - 10.5 mg/dL   Total Protein 7.5  6.0 - 8.3 g/dL   Albumin 3.9  3.5 -  5.2 g/dL   AST 26  0 - 37 U/L   ALT 25  0 - 35 U/L   Alkaline Phosphatase 73  39 - 117 U/L   Total Bilirubin 0.3  0.3 - 1.2 mg/dL   GFR calc non Af Amer >60  >60 mL/min   GFR calc Af Amer    >60 mL/min    Value: >60            The eGFR has been calculated     using the MDRD equation.     This calculation has not been     validated in all clinical     situations.     eGFR's persistently     <60 mL/min signify     possible Chronic Kidney Disease.  URINALYSIS, ROUTINE W REFLEX MICROSCOPIC      Component Value Range   Color, Urine YELLOW  YELLOW   APPearance CLEAR  CLEAR   Specific Gravity, Urine 1.018  1.005 - 1.030   pH 6.5  5.0 - 8.0   Glucose, UA NEGATIVE  NEGATIVE mg/dL   Hgb urine dipstick SMALL (*) NEGATIVE   Bilirubin Urine NEGATIVE  NEGATIVE   Ketones, ur NEGATIVE  NEGATIVE mg/dL   Protein, ur NEGATIVE  NEGATIVE mg/dL   Urobilinogen, UA 0.2  0.0 - 1.0 mg/dL   Nitrite NEGATIVE  NEGATIVE   Leukocytes, UA NEGATIVE  NEGATIVE  URINE MICROSCOPIC-ADD ON      Component Value Range   Squamous Epithelial / LPF MANY (*) RARE   RBC / HPF 0-2  <3 RBC/hpf   Bacteria, UA RARE  RARE   Urine-Other MUCOUS PRESENT     Dg Chest 2 View  01/26/2012  *RADIOLOGY REPORT*  Clinical Data: Shortness of breath  CHEST - 2 VIEW  Comparison: 08/21/2011  Findings: Lungs are clear. No pleural effusion or pneumothorax.  Cardiomediastinal silhouette is within normal limits.  Mild degenerative changes of the visualized thoracolumbar spine.  IMPRESSION: No evidence of acute cardiopulmonary disease.   Original Report Authenticated By: Charline Bills, M.D.      MDM  Albuterol and atrovent neb. pred po.   Cxr.   Reviewed nursing notes and prior charts for additional history.   Recheck post a couple alb nebs.   Improved air exchange. Wheezing improved/resolved.   Pt requests d/c, states feels ready for d/c.         Suzi Roots, MD 01/26/12 [phone]

## 2012-02-11 ENCOUNTER — Encounter: Payer: Self-pay | Admitting: Pulmonary Disease

## 2012-02-11 ENCOUNTER — Ambulatory Visit (INDEPENDENT_AMBULATORY_CARE_PROVIDER_SITE_OTHER): Payer: Medicare Other | Admitting: Pulmonary Disease

## 2012-02-11 VITALS — BP 128/88 | HR 70 | Temp 98.4°F | Ht 66.0 in | Wt 200.2 lb

## 2012-02-11 DIAGNOSIS — J45909 Unspecified asthma, uncomplicated: Secondary | ICD-10-CM

## 2012-02-11 NOTE — Assessment & Plan Note (Signed)
The patient has had a recent acute exacerbation which required an ER visit and a course of prednisone.  However, she was not taking her Asmanex on a regular basis as directed, and was using a friend's nebulizer machine.  I have again stressed to her the importance of inhaled corticosteroids, inhaled asthma isn't inflammatory disease much more so than bronchospasm disease.  I have told her that increased beta agonist use either by inhaler or nebulizer is associated with a high incidence of mortality related to asthma.  If she stays on her Asmanex on a regular basis, and continues to have breakthrough symptoms, would add a LABA to her regimen.

## 2012-02-11 NOTE — Progress Notes (Signed)
  Subjective:    Patient ID: Melinda Mckay, female    DOB: [birth year], 66 y.o.   MRN: [id number]  HPI Patient comes in today for followup of her asthma.  She was started on Asmanex at the last visit, but unfortunately has had a recent acute exacerbation which required an ER visit and treatment with prednisone.  She has almost returned back to her usual baseline.  She tells me that she was only using he Asmanex one puff at bedtime in order to conserve it until her new insurance plan kicked in.  She started having worsening symptoms, and used a family member's nebulizer machine which only helped short term.  I have had another extensive discussion with her today about the role of airway inflammation, and how she has to think about this as a steroid responsive disease and not a bronchodilator responsive disease.   Review of Systems  Constitutional: Negative for fever and unexpected weight change.  HENT: Positive for congestion. Negative for ear pain, nosebleeds, sore throat, rhinorrhea, sneezing, trouble swallowing, dental problem, postnasal drip and sinus pressure.   Eyes: Negative for redness and itching.  Respiratory: Positive for cough. Negative for chest tightness, shortness of breath and wheezing.   Cardiovascular: Negative for palpitations and leg swelling.  Gastrointestinal: Negative for nausea and vomiting.  Genitourinary: Negative for dysuria.  Musculoskeletal: Positive for myalgias ( shoulder and thigh regions--began after starting albuterol neb--now switch to Ipratropium). Negative for joint swelling.  Skin: Negative for rash.  Neurological: Negative for headaches.  Hematological: Does not bruise/bleed easily.  Psychiatric/Behavioral: Negative for dysphoric mood. The patient is not nervous/anxious.        Objective:   Physical Exam Overweight female in no acute distress Nose without purulent discharge noted Oropharynx clear Neck without lymphadenopathy or thyromegaly Chest  with totally clear breath sounds no wheezes Cardiac exam with regular rate and rhythm .Lower extremities without edema, cyanosis Alert and oriented, moves all 4 extremities.       Assessment & Plan:

## 2012-02-11 NOTE — Patient Instructions (Addendum)
Stay on asmanex 2 inhalations every night no matter what. If you have to use your rescue inhaler more than twice a week, this is a RED FLAG, and you need to call the office If you stay on asmanex religiously and you are having breakthru symptoms, we may have to intensify your asthma regimen.  followup with me in 3mos.

## 2012-05-11 ENCOUNTER — Encounter: Payer: Self-pay | Admitting: Pulmonary Disease

## 2012-05-11 ENCOUNTER — Ambulatory Visit (INDEPENDENT_AMBULATORY_CARE_PROVIDER_SITE_OTHER): Payer: Medicare Other | Admitting: Pulmonary Disease

## 2012-05-11 VITALS — BP 118/86 | HR 64 | Temp 97.7°F | Ht 66.0 in | Wt 200.5 lb

## 2012-05-11 DIAGNOSIS — J45909 Unspecified asthma, uncomplicated: Secondary | ICD-10-CM | POA: Diagnosis not present

## 2012-05-11 NOTE — Assessment & Plan Note (Signed)
The patient has done extremely well on Asmanex since last visit, and has not required any rescue inhaler use.  However, she is having side effects that may or may not be related to her Asmanex.  I would like to change her to Qvar for one to 2 weeks to see if the symptoms resolve.  If they do not, then obviously she can stay on the Asmanex and she needs to consult with her primary care physician.  If her symptoms do improve off Asmanex, I would like her to rechallenge herself with the medication to see if they return.

## 2012-05-11 NOTE — Progress Notes (Signed)
  Subjective:    Patient ID: Melinda Mckay, female    DOB: January 12, 1946, 67 y.o.   MRN: [id number]  HPI The patient comes in today for followup of her known asthma.  She has done extremely well from a pulmonary standpoint on Asmanex, and has not had any increased symptoms or need for rescue inhaler.  She feels that her breathing is well above baseline, and she is able to be more active.  She is concerned about Asmanex possibly causing myalgias and arthralgias, but I told her this is a very uncommon complaint with the medication.   Review of Systems  Constitutional: Negative for fever and unexpected weight change.  HENT: Negative for ear pain, nosebleeds, congestion, sore throat, rhinorrhea, sneezing, trouble swallowing, dental problem, postnasal drip and sinus pressure.   Eyes: Negative for redness and itching.  Respiratory: Negative for cough, chest tightness, shortness of breath and wheezing.   Cardiovascular: Negative for palpitations and leg swelling.  Gastrointestinal: Negative for nausea and vomiting.  Genitourinary: Negative for dysuria.  Musculoskeletal: Positive for myalgias. Negative for joint swelling.       Severe body aches in muscles and bones since beginning Asmanex  Skin: Negative for rash.  Neurological: Negative for headaches.  Hematological: Does not bruise/bleed easily.  Psychiatric/Behavioral: Negative for dysphoric mood. The patient is not nervous/anxious.        Objective:   Physical Exam Overweight female in no acute distress Nose without purulent discharge noted Neck without lymphadenopathy or thyromegaly Chest totally clear to auscultation, no wheezing Cardiac exam with regular rate and rhythm Lower extremities without edema, cyanosis Alert and oriented, moves all 4 extremities.       Assessment & Plan:

## 2012-05-11 NOTE — Patient Instructions (Addendum)
Stop asmanex to see if your symptoms improve.  Start qvar  2 inhalations am and pm everyday for next 1-2 weeks.  Rinse mouth well. If your symptoms resolve, would rechallenge yourself again with asmanex to see if they return.  If your symptoms don't improve, then they are not being caused by asmanex and you can stay on this medication.  You will then need to see your primary about the source of your symptoms If doing well, followup with me in 6mos.  Let us know if you need a prescription for the qvar (if asmanex is indeed causing your symptoms).

## 2012-06-03 DIAGNOSIS — M069 Rheumatoid arthritis, unspecified: Secondary | ICD-10-CM | POA: Diagnosis not present

## 2012-06-03 DIAGNOSIS — IMO0001 Reserved for inherently not codable concepts without codable children: Secondary | ICD-10-CM | POA: Diagnosis not present

## 2012-08-04 DIAGNOSIS — D128 Benign neoplasm of rectum: Secondary | ICD-10-CM | POA: Diagnosis not present

## 2012-08-04 DIAGNOSIS — Z09 Encounter for follow-up examination after completed treatment for conditions other than malignant neoplasm: Secondary | ICD-10-CM | POA: Diagnosis not present

## 2012-08-04 DIAGNOSIS — D129 Benign neoplasm of anus and anal canal: Secondary | ICD-10-CM | POA: Diagnosis not present

## 2012-08-04 DIAGNOSIS — K620 Anal polyp: Secondary | ICD-10-CM | POA: Diagnosis not present

## 2012-08-04 DIAGNOSIS — D126 Benign neoplasm of colon, unspecified: Secondary | ICD-10-CM | POA: Diagnosis not present

## 2012-08-04 DIAGNOSIS — K621 Rectal polyp: Secondary | ICD-10-CM | POA: Diagnosis not present

## 2012-08-04 DIAGNOSIS — K648 Other hemorrhoids: Secondary | ICD-10-CM | POA: Diagnosis not present

## 2012-08-04 DIAGNOSIS — K5730 Diverticulosis of large intestine without perforation or abscess without bleeding: Secondary | ICD-10-CM | POA: Diagnosis not present

## 2012-08-04 DIAGNOSIS — Z86010 Personal history of colonic polyps: Secondary | ICD-10-CM | POA: Diagnosis not present

## 2012-09-19 ENCOUNTER — Other Ambulatory Visit: Payer: Self-pay | Admitting: Pulmonary Disease

## 2012-09-21 ENCOUNTER — Telehealth: Payer: Self-pay | Admitting: Pulmonary Disease

## 2012-09-21 MED ORDER — MOMETASONE FUROATE 220 MCG/INH IN AEPB: 2.0000 | INHALATION_SPRAY | Freq: Every day | RESPIRATORY_TRACT | Status: DC

## 2012-09-21 NOTE — Telephone Encounter (Signed)
Refill sent. Pt aware.Melinda Mckay, CMA  

## 2012-09-30 DIAGNOSIS — M6281 Muscle weakness (generalized): Secondary | ICD-10-CM | POA: Diagnosis not present

## 2012-09-30 DIAGNOSIS — I10 Essential (primary) hypertension: Secondary | ICD-10-CM | POA: Diagnosis not present

## 2012-11-11 ENCOUNTER — Ambulatory Visit (INDEPENDENT_AMBULATORY_CARE_PROVIDER_SITE_OTHER): Payer: Medicare Other | Admitting: Pulmonary Disease

## 2012-11-11 ENCOUNTER — Encounter: Payer: Self-pay | Admitting: Pulmonary Disease

## 2012-11-11 VITALS — BP 142/80 | HR 57 | Temp 98.1°F | Ht 66.0 in | Wt 197.0 lb

## 2012-11-11 DIAGNOSIS — J45909 Unspecified asthma, uncomplicated: Secondary | ICD-10-CM

## 2012-11-11 NOTE — Assessment & Plan Note (Signed)
The patient is doing very well on Asmanex, and wishes to stay on this medication.  I've asked her to stay as active as possible, and to call us if she requires her rescue inhaler more than twice a week.  She will followup in one year.

## 2012-11-11 NOTE — Patient Instructions (Addendum)
Will give you a prescription for one year on asmanex. Please call if having increased symptoms, and followup with me in one year.

## 2012-11-11 NOTE — Progress Notes (Signed)
  Subjective:    Patient ID: Melinda Mckay, female    DOB: 26-Jun-1945, 67 y.o.   MRN: [id number]  HPI The patient comes in today for followup of her known asthma.  She is now back on her Asmanex after discovering that her side effects were coming from a different medication.  She is on this compliantly, and has not required her rescue inhaler.  She feels her asthma is doing very well, and is remaining active.   Review of Systems  Constitutional: Negative for fever and unexpected weight change.  HENT: Negative for ear pain, nosebleeds, congestion, sore throat, rhinorrhea, sneezing, trouble swallowing, dental problem, postnasal drip and sinus pressure.   Eyes: Negative for redness and itching.  Respiratory: Negative for cough, chest tightness, shortness of breath and wheezing.   Cardiovascular: Negative for palpitations and leg swelling.  Gastrointestinal: Negative for nausea and vomiting.  Genitourinary: Negative for dysuria.  Musculoskeletal: Negative for joint swelling.  Skin: Negative for rash.  Neurological: Negative for headaches.  Hematological: Does not bruise/bleed easily.  Psychiatric/Behavioral: Negative for dysphoric mood. The patient is not nervous/anxious.        Objective:   Physical Exam Well-developed female in no acute distress Nose without purulence or discharge noted Neck without lymphadenopathy or thyromegaly Chest totally clear to auscultation with good airflow Heart exam with regular rate and rhythm Lower extremities without significant edema, no cyanosis Alert and oriented, moves all 4 extremities.       Assessment & Plan:

## 2012-11-17 ENCOUNTER — Other Ambulatory Visit: Payer: Self-pay | Admitting: Pulmonary Disease

## 2012-12-03 ENCOUNTER — Other Ambulatory Visit: Payer: Self-pay | Admitting: *Deleted

## 2012-12-03 MED ORDER — MOMETASONE FUROATE 220 MCG/INH IN AEPB: INHALATION_SPRAY | RESPIRATORY_TRACT | Status: DC

## 2012-12-30 DIAGNOSIS — Z23 Encounter for immunization: Secondary | ICD-10-CM | POA: Diagnosis not present

## 2013-01-29 DIAGNOSIS — E785 Hyperlipidemia, unspecified: Secondary | ICD-10-CM | POA: Diagnosis not present

## 2013-01-29 DIAGNOSIS — I10 Essential (primary) hypertension: Secondary | ICD-10-CM | POA: Diagnosis not present

## 2013-04-30 ENCOUNTER — Telehealth: Payer: Self-pay | Admitting: Pulmonary Disease

## 2013-04-30 NOTE — Telephone Encounter (Signed)
Have you received PA on Asmanex?

## 2013-04-30 NOTE — Telephone Encounter (Signed)
LMOM x 1 PA has been received--to be completed Monday morning

## 2013-05-03 NOTE — Telephone Encounter (Signed)
PA completed on [email or web address] Case # [id number] Will keep in my box to wait on determination

## 2013-05-11 MED ORDER — MOMETASONE FUROATE 220 MCG/INH IN AEPB: INHALATION_SPRAY | RESPIRATORY_TRACT | Status: DC

## 2013-05-11 NOTE — Telephone Encounter (Signed)
PA approved for Asthmanex Approved through 04/05/13 - 05/05/14 Rx resent to pharmacy Left detailed message on machine

## 2013-05-12 DIAGNOSIS — E785 Hyperlipidemia, unspecified: Secondary | ICD-10-CM | POA: Diagnosis not present

## 2013-05-12 DIAGNOSIS — K219 Gastro-esophageal reflux disease without esophagitis: Secondary | ICD-10-CM | POA: Diagnosis not present

## 2013-11-10 ENCOUNTER — Ambulatory Visit (INDEPENDENT_AMBULATORY_CARE_PROVIDER_SITE_OTHER): Payer: BC Managed Care – PPO | Admitting: Pulmonary Disease

## 2013-11-10 ENCOUNTER — Encounter: Payer: Self-pay | Admitting: Pulmonary Disease

## 2013-11-10 VITALS — BP 130/80 | HR 80 | Temp 98.0°F | Ht 66.0 in | Wt 184.0 lb

## 2013-11-10 DIAGNOSIS — J45909 Unspecified asthma, uncomplicated: Secondary | ICD-10-CM | POA: Diagnosis not present

## 2013-11-10 MED ORDER — MOMETASONE FUROATE 220 MCG/INH IN AEPB: INHALATION_SPRAY | RESPIRATORY_TRACT | Status: DC

## 2013-11-10 NOTE — Assessment & Plan Note (Signed)
The patient is doing very well from an asthma standpoint, and I've asked her to continue on her inhaled corticosteroid on a daily basis. I've also asked her to stay as active as possible, and to work on weight reduction.

## 2013-11-10 NOTE — Patient Instructions (Signed)
No change in medications, you are doing great. followup with me again in one year, but call if increasing symptoms or increased rescue inhaler use.

## 2013-11-10 NOTE — Addendum Note (Signed)
Addended by: Lilli Few on: 11/10/2013 02:45 PM   Modules accepted: Orders

## 2013-11-10 NOTE — Progress Notes (Signed)
   Subjective:    Patient ID: Melinda Mckay, female    DOB: 08/17/1945, 68 y.o.   MRN: [id number]  HPI The patient comes in today for followup of her known asthma. She has stayed compliant with her maintenance inhaled corticosteroid, and has had no symptoms or a rescue inhaler use since the last visit. She is having no nocturnal symptoms, and feels that her exertional tolerance is excellent.   Review of Systems  Constitutional: Negative for fever and unexpected weight change.  HENT: Negative for congestion, dental problem, ear pain, nosebleeds, postnasal drip, rhinorrhea, sinus pressure, sneezing, sore throat and trouble swallowing.   Eyes: Negative for redness and itching.  Respiratory: Negative for cough, chest tightness, shortness of breath and wheezing.   Cardiovascular: Negative for palpitations and leg swelling.  Gastrointestinal: Negative for nausea and vomiting.  Genitourinary: Negative for dysuria.  Musculoskeletal: Negative for joint swelling.  Skin: Negative for rash.  Neurological: Negative for headaches.  Hematological: Does not bruise/bleed easily.  Psychiatric/Behavioral: Negative for dysphoric mood. The patient is not nervous/anxious.        Objective:   Physical Exam Well-developed female in no acute distress Nose without purulence or discharge noted Neck without lymphadenopathy or thyromegaly Chest totally clear to auscultation, no wheezing Cardiac exam with regular rate and rhythm Lower extremities without significant edema, no cyanosis Alert and oriented, moves all 4 extremities.       Assessment & Plan:

## 2013-11-17 ENCOUNTER — Ambulatory Visit: Payer: Medicare Other | Admitting: Pulmonary Disease

## 2013-12-04 ENCOUNTER — Other Ambulatory Visit: Payer: Self-pay | Admitting: Pulmonary Disease

## 2013-12-16 DIAGNOSIS — M7741 Metatarsalgia, right foot: Secondary | ICD-10-CM | POA: Diagnosis not present

## 2013-12-16 DIAGNOSIS — M79671 Pain in right foot: Secondary | ICD-10-CM | POA: Diagnosis not present

## 2014-08-09 ENCOUNTER — Telehealth: Payer: Self-pay | Admitting: Pulmonary Disease

## 2014-08-09 NOTE — Telephone Encounter (Signed)
Pt returning call.Melinda Mckay ° °

## 2014-08-09 NOTE — Telephone Encounter (Signed)
Pt cb [phone]

## 2014-08-09 NOTE — Telephone Encounter (Signed)
Called and left detailed message per pt's request. Left message requesting pt give more information as to what is needed with her asmanex--PA or refill. Last refilled in 11/2013 for 3 inhalers with 3 refills.   Will await call back.

## 2014-08-09 NOTE — Telephone Encounter (Signed)
lmtcb X1 for pt  

## 2014-08-09 NOTE — Telephone Encounter (Signed)
lmtcb X2 for pt.  

## 2014-08-10 NOTE — Telephone Encounter (Signed)
PATIENT IN LOBBY TO SPEAK TO NURSE

## 2014-08-10 NOTE — Telephone Encounter (Signed)
Went to speak with patient She reports that her Asmanex requires yearly PA and that [facility] has done this for her as Asmanex works better for her than the covered alternative She used the last of her Asmanex this morning Sample provided to patient until PA is taken care of and process explained to pt  [employer] [address] - verified with Drai that the Asmanex is indeed requiring PA.  Was 'faxed' thru CoverMyMeds, but requested this be refaxed to triage to initiate PA.  Will await fax in triage.

## 2014-08-11 NOTE — Telephone Encounter (Signed)
Fax received Will initiate PA

## 2014-08-11 NOTE — Telephone Encounter (Signed)
Called # on form [phone]  ID # [id number] Was advised pt has [employer]. Was transferred to them and was then told we needed to submit them a form. i initiated PA via cover my meds (Key: [id number]) Will await response

## 2014-08-12 NOTE — Telephone Encounter (Signed)
Hi Sonya:  Have you gotten anything back on this PA request?    Please advise.

## 2014-08-15 NOTE — Telephone Encounter (Signed)
Please advise Melinda Mckay; Thanks. :)

## 2014-08-15 NOTE — Telephone Encounter (Signed)
I have not received anything on this patient.

## 2014-08-16 NOTE — Telephone Encounter (Signed)
I have not received anything. Since sonya is doing PA's will send to her to see if she can call and check on this.

## 2014-08-17 MED ORDER — BECLOMETHASONE DIPROPIONATE 80 MCG/ACT IN AERS
2.0000 | INHALATION_SPRAY | Freq: Two times a day (BID) | RESPIRATORY_TRACT | Status: DC
Start: 2014-08-17 — End: 2015-09-11

## 2014-08-17 NOTE — Telephone Encounter (Signed)
Sonya please advise if you have any forms for this PA.  Thanks!

## 2014-08-17 NOTE — Telephone Encounter (Signed)
Dr. Gwenette Greet please advise if flovent or QVAR is acceptable? thanks

## 2014-08-17 NOTE — Telephone Encounter (Signed)
n reguards to question: she hasn't used any other inhaler and if Bruce recommends something else she is ok with this she is not allergic to any thing and prefers for it to be called to [facility] on [address].Hillery Hunter

## 2014-08-17 NOTE — Telephone Encounter (Signed)
Rx sent to pharmacy. Patient notified. Nothing further needed.  

## 2014-08-17 NOTE — Telephone Encounter (Signed)
lmtcb

## 2014-08-17 NOTE — Telephone Encounter (Signed)
Ok to use qvar 74mcg, take 2 inhalations each am and pm everyday, whether she needs or not. Rinse mouth and gargle with water after taking dose.

## 2014-08-17 NOTE — Telephone Encounter (Signed)
Pt returned call - please leave message on vm - [phone]

## 2014-08-17 NOTE — Telephone Encounter (Signed)
A detailed message has been for the patient. Rx was sent to [facility].  Rx was transferred to [place]. [address] never received transfer. Rx was verbally called in and insurance ran. It does need PA her formulary has changed. Insurance covers [employer] and Qvar. Left message for patient to call back and let me know if she has tried any other inhaler. PA has been started but not submitted until info received from patient.

## 2014-08-19 NOTE — Telephone Encounter (Signed)
Your request has been approved Asmanex [id number];AESLPNP Name:ST: Inhaled Corticosteroids (Aerospan, [place]vesco, Asmanex HFA, Asmanex Twisthaler) *SoNC*;Status:Approved;Coverage Start Date:07/18/2014;Coverage End Date:08/18/2015

## 2014-08-19 NOTE — Telephone Encounter (Signed)
Upon calling [facility] with approval for Asmanex. Qvar is about 20 cheaper than Asmanex. Called patient and left 2nd msg. Which medication does she want?

## 2014-11-16 DIAGNOSIS — Z23 Encounter for immunization: Secondary | ICD-10-CM | POA: Diagnosis not present

## 2015-01-12 ENCOUNTER — Other Ambulatory Visit: Payer: Self-pay

## 2015-01-12 DIAGNOSIS — Z1231 Encounter for screening mammogram for malignant neoplasm of breast: Secondary | ICD-10-CM

## 2015-02-06 ENCOUNTER — Ambulatory Visit
Admission: RE | Admit: 2015-02-06 | Discharge: 2015-02-06 | Disposition: A | Discharge status: Home / Self Care | Payer: Medicare Other | Source: Ambulatory Visit

## 2015-02-06 ENCOUNTER — Other Ambulatory Visit: Payer: Self-pay | Admitting: Obstetrics and Gynecology

## 2015-02-06 DIAGNOSIS — Z1231 Encounter for screening mammogram for malignant neoplasm of breast: Secondary | ICD-10-CM

## 2015-02-06 DIAGNOSIS — M8580 Other specified disorders of bone density and structure, unspecified site: Secondary | ICD-10-CM

## 2015-02-10 ENCOUNTER — Ambulatory Visit
Admission: RE | Admit: 2015-02-10 | Discharge: 2015-02-10 | Disposition: A | Discharge status: Home / Self Care | Payer: Medicare Other | Source: Ambulatory Visit | Attending: Obstetrics and Gynecology | Admitting: Obstetrics and Gynecology

## 2015-02-10 DIAGNOSIS — M8580 Other specified disorders of bone density and structure, unspecified site: Secondary | ICD-10-CM

## 2015-03-20 DIAGNOSIS — H33322 Round hole, left eye: Secondary | ICD-10-CM | POA: Diagnosis not present

## 2015-03-20 DIAGNOSIS — H33312 Horseshoe tear of retina without detachment, left eye: Secondary | ICD-10-CM | POA: Diagnosis not present

## 2015-03-20 DIAGNOSIS — H43813 Vitreous degeneration, bilateral: Secondary | ICD-10-CM | POA: Diagnosis not present

## 2015-09-07 ENCOUNTER — Other Ambulatory Visit: Payer: Self-pay | Admitting: Pulmonary Disease

## 2015-09-07 NOTE — Telephone Encounter (Signed)
Pt has not been seen since 2015 and has no pending appt  LMTCB

## 2015-09-08 NOTE — Telephone Encounter (Signed)
LMTCB

## 2015-09-08 NOTE — Telephone Encounter (Signed)
Called spoke with pt. She states that she needs a refill on her Asmanex. I explained to her that she has not been seen since 2015. I scheduled her with MW on 09/11/15. She voiced understanding and had no further questions. Nothing further needed.

## 2015-09-08 NOTE — Telephone Encounter (Signed)
Call her cell [phone] needs detailed msg

## 2015-09-11 ENCOUNTER — Ambulatory Visit (INDEPENDENT_AMBULATORY_CARE_PROVIDER_SITE_OTHER): Payer: BC Managed Care – PPO | Admitting: Internal Medicine

## 2015-09-11 ENCOUNTER — Encounter: Payer: Self-pay | Admitting: Internal Medicine

## 2015-09-11 VITALS — BP 126/78 | HR 64 | Ht 65.5 in | Wt 178.0 lb

## 2015-09-11 DIAGNOSIS — J4530 Mild persistent asthma, uncomplicated: Secondary | ICD-10-CM | POA: Diagnosis not present

## 2015-09-11 MED ORDER — BECLOMETHASONE DIPROPIONATE 80 MCG/ACT IN AERS: INHALATION_SPRAY | RESPIRATORY_TRACT | Status: DC

## 2015-09-11 MED ORDER — ALBUTEROL SULFATE HFA 108 (90 BASE) MCG/ACT IN AERS: INHALATION_SPRAY | RESPIRATORY_TRACT | Status: DC

## 2015-09-11 NOTE — Progress Notes (Signed)
Subjective:     Patient ID: Melinda Mckay, female   DOB: 12/13/1945,    MRN: [id number]  HPI  48 yobf quit smoking 1995 with dx of asthma by Dr Gwenette Greet final ov  11/10/13 rec  maint asthmanex 220 2 each day/ prn saba   09/11/2015  1st   office visit/ Taejon Irani  - transition of care Chief Complaint  Patient presents with  . Follow-up    Former Dr Gwenette Greet pt here for medication refill. Pt states that her breathing is overall doing well and denies any co's today. She very rarely uses albuterol.   very confused with meds, thinks qvar is a rescue med/ no problem from coughing on asmanex dpi   Not limited by breathing from desired activities    No obvious day to day or daytime variability or assoc excess/ purulent sputum or mucus plugs or hemoptysis or cp or chest tightness, subjective wheeze or overt sinus or hb symptoms. No unusual exp hx or h/o childhood pna/ asthma or knowledge of premature birth.  Sleeping ok without nocturnal  or early am exacerbation  of respiratory  c/o's or need for noct saba. Also denies any obvious fluctuation of symptoms with weather or environmental changes or other aggravating or alleviating factors except as outlined above   Current Medications, Allergies, Complete Past Medical History, Past Surgical History, Family History, and Social History were reviewed in [employer] record.  ROS  The following are not active complaints unless bolded sore throat, dysphagia, dental problems, itching, sneezing,  nasal congestion or excess/ purulent secretions, ear ache,   fever, chills, sweats, unintended wt loss, classically pleuritic or exertional cp,  orthopnea pnd or leg swelling, presyncope, palpitations, abdominal pain, anorexia, nausea, vomiting, diarrhea  or change in bowel or bladder habits, change in stools or urine, dysuria,hematuria,  rash, arthralgias, visual complaints, headache, numbness, weakness or ataxia or problems with walking or coordination,   change in mood/affect or memory.          Review of Systems     Objective:   Physical Exam  pleasant amb bf nad  Wt Readings from Last 3 Encounters:  09/11/15 178 lb (80.74 kg)  11/10/13 184 lb (83.462 kg)  11/11/12 197 lb (89.359 kg)    Vital signs reviewed   HEENT: nl dentition, turbinates, and oropharynx. Nl external ear canals without cough reflex   NECK :  without JVD/Nodes/TM/ nl carotid upstrokes bilaterally   LUNGS: no acc muscle use,  Nl contour chest which is clear to A and P bilaterally without cough on insp or exp maneuvers   CV:  RRR  no s3 or murmur or increase in P2, no edema   ABD:  soft and nontender with nl inspiratory excursion in the supine position. No bruits or organomegaly, bowel sounds nl  MS:  Nl gait/ ext warm without deformities, calf tenderness, cyanosis or clubbing No obvious joint restrictions   SKIN: warm and dry without lesions    NEURO:  alert, approp, nl sensorium with  no motor deficits         Assessment:

## 2015-09-11 NOTE — Patient Instructions (Signed)
Plan A = Automatic = asthmanex or qvar 2 puffs daily   Plan B = Backup Only use your albuterol(Proair) as a rescue medication to be used if you can't catch your breath/ wheeze/ cough/chest tightness  - The less you use it, the better it will work when you need it. - Ok to use the inhaler up to 2 puffs  every 4 hours if you must but call for appointment if use goes up over your usual need - Don't leave home without it !!  (think of it like the spare tire for your car)    GERD (REFLUX)  is an extremely common cause of respiratory symptoms just like yours , many times with no obvious heartburn at all.    It can be treated with medication, but also with lifestyle changes including elevation of the head of your bed (ideally with 6 inch  bed blocks),  Smoking cessation, avoidance of late meals, excessive alcohol, and avoid fatty foods, chocolate, peppermint, colas, red wine, and acidic juices such as orange juice.  NO MINT OR MENTHOL PRODUCTS SO NO COUGH DROPS  USE SUGARLESS CANDY INSTEAD (Jolley ranchers or Stover's or Life Savers) or even ice chips will also do - the key is to swallow to prevent all throat clearing. NO OIL BASED VITAMINS - use powdered substitutes.    Please schedule a follow up visit in 12 months but call sooner if needed

## 2015-09-11 NOTE — Assessment & Plan Note (Signed)
PFT's 08/2011:  FEV1 1.81 (84%), ratio 69, 19% change with BD and return of ratio to normal, ++airtrapping. - 09/11/2015  After extensive coaching HFA effectiveness =    90%   I had an extended discussion with the patient reviewing all relevant studies completed to date and  lasting 25 minutes of a 40  minute transition of  Care office visit    1) rule of 2's reviewed/ difference between ICS and saba reviewed as well  2) best to use either all dpi's or all hfa's > she requests all hfa's and she mastered it nicely  3) role of GERD in asthma also reviewed > diet given  4) Formulary restrictions will be an ongoing challenge for the forseable future and I would be happy to pick an alternative if the pt will first  provide me a list of them but pt  will need to return here for training for any new device that is required eg dpi vs hfa vs respimat.    In meantime we can always provide samples so the patient never runs out of any needed respiratory medications.   5) Each maintenance medication was reviewed in detail including most importantly the difference between maintenance and prns and under what circumstances the prns are to be triggered using an action plan format that is not reflected in the computer generated alphabetically organized AVS.    Please see instructions for details which were reviewed in writing and the patient given a copy highlighting the part that I personally wrote and discussed at today's ov.

## 2015-09-15 ENCOUNTER — Telehealth: Payer: Self-pay | Admitting: Internal Medicine

## 2015-09-15 NOTE — Telephone Encounter (Signed)
Spoke with pharmacist. They were asking for Asmanex, not albuterol. Per chart pt was changed to Qvar. This was sent on 09/11/15. Pharmacy has rx. They will fill. Nothing further needed.

## 2015-10-17 ENCOUNTER — Telehealth: Payer: Self-pay

## 2015-10-17 NOTE — Telephone Encounter (Signed)
Refill request for Asmanex. Per chart and LOV note pt was changed to Qvar. Need to clarify which rx pt is taking.  LMTCB

## 2015-10-18 NOTE — Telephone Encounter (Signed)
Attempted to contact patient, left message for patient to return call.

## 2015-10-19 ENCOUNTER — Telehealth: Payer: Self-pay | Admitting: Internal Medicine

## 2015-10-19 NOTE — Telephone Encounter (Signed)
Opened in error

## 2015-10-19 NOTE — Telephone Encounter (Signed)
Duplicate message. See phone note from 10/17/2015. Will sign off.

## 2015-10-20 ENCOUNTER — Telehealth: Payer: Self-pay | Admitting: Internal Medicine

## 2015-10-20 NOTE — Telephone Encounter (Signed)
Called and made the pt aware that we did get her message back and updated her med list. Nothing further is needed.

## 2015-12-02 DIAGNOSIS — Z23 Encounter for immunization: Secondary | ICD-10-CM | POA: Diagnosis not present

## 2015-12-04 ENCOUNTER — Other Ambulatory Visit: Payer: Self-pay | Admitting: Cardiology

## 2015-12-04 DIAGNOSIS — M549 Dorsalgia, unspecified: Secondary | ICD-10-CM

## 2015-12-06 ENCOUNTER — Ambulatory Visit
Admission: RE | Admit: 2015-12-06 | Discharge: 2015-12-06 | Disposition: A | Discharge status: Home / Self Care | Payer: Medicare Other | Source: Ambulatory Visit | Attending: Cardiology | Admitting: Cardiology

## 2015-12-06 DIAGNOSIS — R935 Abnormal findings on diagnostic imaging of other abdominal regions, including retroperitoneum: Secondary | ICD-10-CM | POA: Diagnosis not present

## 2015-12-06 DIAGNOSIS — M549 Dorsalgia, unspecified: Secondary | ICD-10-CM

## 2015-12-12 ENCOUNTER — Other Ambulatory Visit: Payer: Self-pay | Admitting: Cardiology

## 2015-12-12 DIAGNOSIS — R3129 Other microscopic hematuria: Secondary | ICD-10-CM

## 2015-12-12 DIAGNOSIS — R1084 Generalized abdominal pain: Secondary | ICD-10-CM

## 2015-12-14 ENCOUNTER — Ambulatory Visit
Admission: RE | Admit: 2015-12-14 | Discharge: 2015-12-14 | Disposition: A | Discharge status: Home / Self Care | Payer: Medicare Other | Source: Ambulatory Visit | Attending: Cardiology | Admitting: Cardiology

## 2015-12-14 DIAGNOSIS — R3129 Other microscopic hematuria: Secondary | ICD-10-CM

## 2015-12-14 DIAGNOSIS — R1084 Generalized abdominal pain: Secondary | ICD-10-CM

## 2015-12-19 ENCOUNTER — Other Ambulatory Visit: Payer: Self-pay | Admitting: Cardiology

## 2015-12-19 DIAGNOSIS — N200 Calculus of kidney: Secondary | ICD-10-CM

## 2016-01-02 DIAGNOSIS — R1084 Generalized abdominal pain: Secondary | ICD-10-CM | POA: Diagnosis not present

## 2016-01-09 DIAGNOSIS — R3129 Other microscopic hematuria: Secondary | ICD-10-CM | POA: Diagnosis not present

## 2016-01-09 DIAGNOSIS — R1084 Generalized abdominal pain: Secondary | ICD-10-CM | POA: Diagnosis not present

## 2016-07-17 DIAGNOSIS — R600 Localized edema: Secondary | ICD-10-CM | POA: Diagnosis not present

## 2016-07-17 DIAGNOSIS — F419 Anxiety disorder, unspecified: Secondary | ICD-10-CM | POA: Diagnosis not present

## 2016-07-17 DIAGNOSIS — G2581 Restless legs syndrome: Secondary | ICD-10-CM | POA: Diagnosis not present

## 2016-07-17 DIAGNOSIS — Z1159 Encounter for screening for other viral diseases: Secondary | ICD-10-CM | POA: Diagnosis not present

## 2016-07-17 DIAGNOSIS — I10 Essential (primary) hypertension: Secondary | ICD-10-CM | POA: Diagnosis not present

## 2016-07-17 DIAGNOSIS — R7303 Prediabetes: Secondary | ICD-10-CM | POA: Diagnosis not present

## 2016-07-17 DIAGNOSIS — E785 Hyperlipidemia, unspecified: Secondary | ICD-10-CM | POA: Diagnosis not present

## 2016-07-26 DIAGNOSIS — G2581 Restless legs syndrome: Secondary | ICD-10-CM | POA: Diagnosis not present

## 2016-07-26 DIAGNOSIS — I10 Essential (primary) hypertension: Secondary | ICD-10-CM | POA: Diagnosis not present

## 2016-07-26 DIAGNOSIS — E785 Hyperlipidemia, unspecified: Secondary | ICD-10-CM | POA: Diagnosis not present

## 2016-07-26 DIAGNOSIS — Z1159 Encounter for screening for other viral diseases: Secondary | ICD-10-CM | POA: Diagnosis not present

## 2016-08-19 DIAGNOSIS — R7303 Prediabetes: Secondary | ICD-10-CM | POA: Diagnosis not present

## 2016-08-19 DIAGNOSIS — G2581 Restless legs syndrome: Secondary | ICD-10-CM | POA: Diagnosis not present

## 2016-08-19 DIAGNOSIS — R600 Localized edema: Secondary | ICD-10-CM | POA: Diagnosis not present

## 2016-08-19 DIAGNOSIS — F419 Anxiety disorder, unspecified: Secondary | ICD-10-CM | POA: Diagnosis not present

## 2016-08-19 DIAGNOSIS — I10 Essential (primary) hypertension: Secondary | ICD-10-CM | POA: Diagnosis not present

## 2016-08-19 DIAGNOSIS — E785 Hyperlipidemia, unspecified: Secondary | ICD-10-CM | POA: Diagnosis not present

## 2016-08-19 DIAGNOSIS — Z23 Encounter for immunization: Secondary | ICD-10-CM | POA: Diagnosis not present

## 2016-09-10 ENCOUNTER — Encounter: Payer: Self-pay | Admitting: Internal Medicine

## 2016-09-10 ENCOUNTER — Ambulatory Visit (INDEPENDENT_AMBULATORY_CARE_PROVIDER_SITE_OTHER): Payer: BC Managed Care – PPO | Admitting: Internal Medicine

## 2016-09-10 VITALS — BP 114/74 | HR 58 | Ht 65.5 in | Wt 175.0 lb

## 2016-09-10 DIAGNOSIS — J4530 Mild persistent asthma, uncomplicated: Secondary | ICD-10-CM

## 2016-09-10 MED ORDER — BECLOMETHASONE DIPROP HFA 80 MCG/ACT IN AERB: 2.0000 | INHALATION_SPRAY | Freq: Every day | RESPIRATORY_TRACT | 11 refills | Status: DC

## 2016-09-10 MED ORDER — BECLOMETHASONE DIPROP HFA 80 MCG/ACT IN AERB: 2.0000 | INHALATION_SPRAY | Freq: Two times a day (BID) | RESPIRATORY_TRACT | 0 refills | Status: DC

## 2016-09-10 NOTE — Patient Instructions (Signed)
Plan A = Automatic = Qvar 80  2 pffs daily   Work on inhaler technique:  relax and gently blow all the way out then take a nice smooth deep breath back in, triggering the inhaler at same time you start breathing in.  Hold for up to 5 seconds if you can. Blow out thru nose. Rinse and gargle with water when done      Plan B = Backup Only use your albuterol (proair)  as a rescue medication to be used if you can't catch your breath by resting or doing a relaxed purse lip breathing pattern.  - The less you use it, the better it will work when you need it. - Ok to use the inhaler up to 2 puffs  every 4 hours if you must but call for appointment if use goes up over your usual need - Don't leave home without it !!  (think of it like the spare tire for your car)    Return in one year, sooner if needed

## 2016-09-10 NOTE — Assessment & Plan Note (Signed)
PFT's 08/2011:  FEV1 1.81 (84%), ratio 69, 19% change with BD and return of ratio to normal, ++airtrapping. - 09/10/2016  After extensive coaching HFA effectiveness =   75% even with autohaler (short Ti)  Despite suboptimal hfa > All goals of chronic asthma control met including optimal function and elimination of symptoms with minimal need for rescue therapy on just qvar 80 2 each am   Contingencies discussed in full including contacting this office immediately if not controlling the symptoms using the rule of two's.     F/u yearly   Each maintenance medication was reviewed in detail including most importantly the difference between maintenance and as needed and under what circumstances the prns are to be used.  Please see AVS for specific  Instructions which are unique to this visit and I personally typed out  which were reviewed in detail in writing with the patient and a copy provided.   

## 2016-09-10 NOTE — Progress Notes (Signed)
Subjective:     Patient ID: Melinda Mckay, female   DOB: 30-Jan-1946,    MRN: [id number]    Brief patient profile:  39  yobf quit smoking 1995 with dx of asthma by Dr Gwenette Greet final ov  11/10/13 rec  maint asthmanex 220 2 each day/ prn saba  Primary is Dr Maia Petties    History of Present Illness  09/11/2015  1st   office visit/ Melinda Mckay  - transition of care Chief Complaint  Patient presents with  . Follow-up    Former Dr Gwenette Greet pt here for medication refill. Pt states that her breathing is overall doing well and denies any co's today. She very rarely uses albuterol.   very confused with meds, thinks qvar is a rescue med/ no problem from coughing on asmanex dpi  rec Plan A = Automatic = asthmanex or qvar 2 puffs daily  Plan B = Backup Only use your albuterol(Proair) as a rescue medication  GERD  Diet     09/10/2016  f/u ov/Melinda Mckay re:  Mild persistent asthma / qvar 80 2 pffs each pm  Chief Complaint  Patient presents with  . Follow-up    Breathing is doing well. No new co's. She very rarely uses her rescue inhaler.   Not limited by breathing from desired activities but very sedentary    No obvious day to day or daytime variability or assoc excess/ purulent sputum or mucus plugs or hemoptysis or cp or chest tightness, subjective wheeze or overt sinus or hb symptoms. No unusual exp hx or h/o childhood pna/ asthma or knowledge of premature birth.  Sleeping ok without nocturnal  or early am exacerbation  of respiratory  c/o's or need for noct saba. Also denies any obvious fluctuation of symptoms with weather or environmental changes or other aggravating or alleviating factors except as outlined above   Current Medications, Allergies, Complete Past Medical History, Past Surgical History, Family History, and Social History were reviewed in [employer] record.  ROS  The following are not active complaints unless bolded sore throat, dysphagia, dental problems, itching, sneezing,   nasal congestion or excess/ purulent secretions, ear ache,   fever, chills, sweats, unintended wt loss, classically pleuritic or exertional cp,  orthopnea pnd or leg swelling, presyncope, palpitations, abdominal pain, anorexia, nausea, vomiting, diarrhea  or change in bowel or bladder habits, change in stools or urine, dysuria,hematuria,  rash, arthralgias, visual complaints, headache, numbness, weakness or ataxia or problems with walking or coordination,  change in mood/affect or memory.                   Objective:   Physical Exam    pleasant amb bf nad  09/10/2016         175   09/11/15 178 lb (80.74 kg)  11/10/13 184 lb (83.462 kg)  11/11/12 197 lb (89.359 kg)    Vital signs reviewed   - Note on arrival 02 sats  98% on RA     HEENT: nl dentition, turbinates, and oropharynx. Nl external ear canals without cough reflex   NECK :  without JVD/Nodes/TM/ nl carotid upstrokes bilaterally   LUNGS: no acc muscle use,  Nl contour chest which is clear to A and P bilaterally without cough on insp or exp maneuvers   CV:  RRR  no s3 or murmur or increase in P2, no edema   ABD:  soft and nontender with nl inspiratory excursion in the supine position. No bruits or organomegaly,  bowel sounds nl  MS:  Nl gait/ ext warm without deformities, calf tenderness, cyanosis or clubbing No obvious joint restrictions   SKIN: warm and dry without lesions    NEURO:  alert, approp, nl sensorium with  no motor deficits         Assessment:

## 2016-10-31 ENCOUNTER — Other Ambulatory Visit: Payer: Self-pay | Admitting: Internal Medicine

## 2016-10-31 DIAGNOSIS — F419 Anxiety disorder, unspecified: Secondary | ICD-10-CM | POA: Diagnosis not present

## 2016-10-31 DIAGNOSIS — E785 Hyperlipidemia, unspecified: Secondary | ICD-10-CM | POA: Diagnosis not present

## 2016-10-31 DIAGNOSIS — L720 Epidermal cyst: Secondary | ICD-10-CM | POA: Diagnosis not present

## 2016-10-31 DIAGNOSIS — M79669 Pain in unspecified lower leg: Secondary | ICD-10-CM | POA: Diagnosis not present

## 2016-10-31 DIAGNOSIS — I10 Essential (primary) hypertension: Secondary | ICD-10-CM | POA: Diagnosis not present

## 2016-10-31 DIAGNOSIS — G2581 Restless legs syndrome: Secondary | ICD-10-CM | POA: Diagnosis not present

## 2016-11-01 ENCOUNTER — Other Ambulatory Visit: Payer: Self-pay | Admitting: Internal Medicine

## 2016-11-04 ENCOUNTER — Ambulatory Visit
Admission: RE | Admit: 2016-11-04 | Discharge: 2016-11-04 | Disposition: A | Discharge status: Home / Self Care | Payer: Medicare Other | Source: Ambulatory Visit | Attending: Internal Medicine | Admitting: Internal Medicine

## 2016-11-04 ENCOUNTER — Other Ambulatory Visit: Payer: Self-pay | Admitting: Internal Medicine

## 2016-11-04 DIAGNOSIS — M79605 Pain in left leg: Principal | ICD-10-CM

## 2016-11-04 DIAGNOSIS — M79604 Pain in right leg: Secondary | ICD-10-CM

## 2016-11-05 DIAGNOSIS — F419 Anxiety disorder, unspecified: Secondary | ICD-10-CM | POA: Insufficient documentation

## 2016-11-05 DIAGNOSIS — R600 Localized edema: Secondary | ICD-10-CM | POA: Insufficient documentation

## 2016-11-05 DIAGNOSIS — I10 Essential (primary) hypertension: Secondary | ICD-10-CM | POA: Insufficient documentation

## 2016-11-05 DIAGNOSIS — R7303 Prediabetes: Secondary | ICD-10-CM | POA: Insufficient documentation

## 2016-11-05 DIAGNOSIS — E785 Hyperlipidemia, unspecified: Secondary | ICD-10-CM | POA: Insufficient documentation

## 2016-11-05 DIAGNOSIS — G2581 Restless legs syndrome: Secondary | ICD-10-CM | POA: Insufficient documentation

## 2016-11-18 ENCOUNTER — Telehealth: Payer: Self-pay | Admitting: Internal Medicine

## 2016-11-18 NOTE — Telephone Encounter (Signed)
I have a sample of Qvar and will place up front for pt. I asked her to call back to see if she can look at her formulary and tell us what her insurance will cover. LM to call back.

## 2016-11-18 NOTE — Telephone Encounter (Signed)
Called patient back to get the name of the medication needing a PA. She did not answer.   Called [facility] and was told that the medication is Qvar 80.   Called [facility] for a PA. It was approved until 11/18/2017.   [facility] is aware.

## 2016-11-18 NOTE — Telephone Encounter (Signed)
lmtcb for pt.  

## 2016-11-18 NOTE — Telephone Encounter (Signed)
Pt is returning call  and says that it is fine to please leave a detailed message.

## 2016-11-18 NOTE — Telephone Encounter (Signed)
Pt has to work till 5 and will niot be able to answer phone, but she needs to pick up when she gets off if we have samples, please advise.Hillery Hunter

## 2016-11-21 ENCOUNTER — Emergency Department (HOSPITAL_COMMUNITY): Payer: BC Managed Care – PPO

## 2016-11-21 ENCOUNTER — Encounter (HOSPITAL_COMMUNITY): Payer: Self-pay | Admitting: Emergency Medicine

## 2016-11-21 ENCOUNTER — Emergency Department (HOSPITAL_COMMUNITY)
Admission: EM | Admit: 2016-11-21 | Discharge: 2016-11-22 | Disposition: A | Discharge status: Home / Self Care | Payer: BC Managed Care – PPO | Attending: Emergency Medicine | Admitting: Emergency Medicine

## 2016-11-21 DIAGNOSIS — Z7982 Long term (current) use of aspirin: Secondary | ICD-10-CM | POA: Diagnosis not present

## 2016-11-21 DIAGNOSIS — R1033 Periumbilical pain: Secondary | ICD-10-CM | POA: Diagnosis not present

## 2016-11-21 DIAGNOSIS — R109 Unspecified abdominal pain: Secondary | ICD-10-CM | POA: Diagnosis not present

## 2016-11-21 DIAGNOSIS — E876 Hypokalemia: Secondary | ICD-10-CM | POA: Insufficient documentation

## 2016-11-21 DIAGNOSIS — K529 Noninfective gastroenteritis and colitis, unspecified: Secondary | ICD-10-CM

## 2016-11-21 DIAGNOSIS — R1030 Lower abdominal pain, unspecified: Secondary | ICD-10-CM

## 2016-11-21 DIAGNOSIS — I10 Essential (primary) hypertension: Secondary | ICD-10-CM | POA: Insufficient documentation

## 2016-11-21 DIAGNOSIS — R112 Nausea with vomiting, unspecified: Secondary | ICD-10-CM | POA: Diagnosis not present

## 2016-11-21 DIAGNOSIS — K921 Melena: Secondary | ICD-10-CM

## 2016-11-21 DIAGNOSIS — Z87891 Personal history of nicotine dependence: Secondary | ICD-10-CM | POA: Diagnosis not present

## 2016-11-21 DIAGNOSIS — R197 Diarrhea, unspecified: Secondary | ICD-10-CM | POA: Diagnosis not present

## 2016-11-21 DIAGNOSIS — J45909 Unspecified asthma, uncomplicated: Secondary | ICD-10-CM | POA: Diagnosis not present

## 2016-11-21 DIAGNOSIS — Z79899 Other long term (current) drug therapy: Secondary | ICD-10-CM | POA: Diagnosis not present

## 2016-11-21 DIAGNOSIS — E860 Dehydration: Secondary | ICD-10-CM | POA: Diagnosis not present

## 2016-11-21 LAB — COMPREHENSIVE METABOLIC PANEL
ALBUMIN: 4.1 g/dL (ref 3.5–5.0)
ALT: 27 U/L (ref 14–54)
ANION GAP: 10 (ref 5–15)
AST: 29 U/L (ref 15–41)
Alkaline Phosphatase: 78 U/L (ref 38–126)
BUN: 15 mg/dL (ref 6–20)
CALCIUM: 9.6 mg/dL (ref 8.9–10.3)
CHLORIDE: 98 mmol/L — AB (ref 101–111)
CO2: 31 mmol/L (ref 22–32)
Creatinine, Ser: 0.86 mg/dL (ref 0.44–1.00)
GFR calc non Af Amer: >60 mL/min (ref >60)
GLUCOSE: 90 mg/dL (ref 65–99)
POTASSIUM: 2.4 mmol/L — AB (ref 3.5–5.1)
SODIUM: 139 mmol/L (ref 135–145)
Total Bilirubin: 0.7 mg/dL (ref 0.3–1.2)
Total Protein: 7.9 g/dL (ref 6.5–8.1)

## 2016-11-21 LAB — CBC
HEMATOCRIT: 43 % (ref 36.0–46.0)
HEMOGLOBIN: 14.6 g/dL (ref 12.0–15.0)
MCH: 29.2 pg (ref 26.0–34.0)
MCHC: 34 g/dL (ref 30.0–36.0)
MCV: 86 fL (ref 78.0–100.0)
Platelets: 296 10*3/uL (ref 150–400)
RBC: 5 MIL/uL (ref 3.87–5.11)
RDW: 13.2 % (ref 11.5–15.5)
WBC: 6.5 10*3/uL (ref 4.0–10.5)

## 2016-11-21 LAB — TYPE AND SCREEN
ABO/RH(D): AB POS
Antibody Screen: NEGATIVE

## 2016-11-21 LAB — POC OCCULT BLOOD, ED: FECAL OCCULT BLD: POSITIVE — AB

## 2016-11-21 LAB — ABO/RH: ABO/RH(D): AB POS

## 2016-11-21 MED ORDER — IOPAMIDOL (ISOVUE-300) INJECTION 61%
100.0000 mL | Freq: Once | INTRAVENOUS | Status: AC | PRN
  Administered 2016-11-21: 100 mL via INTRAVENOUS

## 2016-11-21 MED ORDER — SODIUM CHLORIDE 0.9 % IV BOLUS (SEPSIS)
1000.0000 mL | Freq: Once | INTRAVENOUS | Status: AC
Start: 2016-11-21 — End: 2016-11-22
  Administered 2016-11-21: 1000 mL via INTRAVENOUS

## 2016-11-21 MED ORDER — POTASSIUM CHLORIDE 10 MEQ/100ML IV SOLN
10.0000 meq | Freq: Once | INTRAVENOUS | Status: AC
  Administered 2016-11-21: 10 meq via INTRAVENOUS
  Filled 2016-11-21: qty 100

## 2016-11-21 MED ORDER — POTASSIUM CHLORIDE CRYS ER 20 MEQ PO TBCR
40.0000 meq | EXTENDED_RELEASE_TABLET | Freq: Once | ORAL | Status: AC
  Administered 2016-11-21: 40 meq via ORAL
  Filled 2016-11-21: qty 2

## 2016-11-21 MED ORDER — MAGNESIUM SULFATE IN D5W 1-5 GM/100ML-% IV SOLN
1.0000 g | Freq: Once | INTRAVENOUS | Status: AC
  Administered 2016-11-21: 1 g via INTRAVENOUS
  Filled 2016-11-21: qty 100

## 2016-11-21 MED ORDER — MAGNESIUM SULFATE 2 GM/50ML IV SOLN: 1.0000 g | Freq: Once | INTRAVENOUS | Status: DC

## 2016-11-21 MED ORDER — ACETAMINOPHEN 500 MG PO TABS: 1000.0000 mg | ORAL_TABLET | Freq: Once | ORAL | Status: DC

## 2016-11-21 MED ORDER — IOPAMIDOL (ISOVUE-300) INJECTION 61%
INTRAVENOUS | Status: AC
  Filled 2016-11-21: qty 100

## 2016-11-21 MED ORDER — POTASSIUM CHLORIDE 10 MEQ/100ML IV SOLN
10.0000 meq | Freq: Once | INTRAVENOUS | Status: AC
  Administered 2016-11-22: 10 meq via INTRAVENOUS
  Filled 2016-11-21: qty 100

## 2016-11-21 NOTE — ED Triage Notes (Signed)
Patient c/o loose stools for past 48 hours. Patient reports blood in stools since last night. Patient c/o intermittent lower abd pain

## 2016-11-21 NOTE — ED Notes (Signed)
Date and time results received: 11/21/16 1950   Test: Potassium level Critical Value:2.4 Name of Provider Notified:Dr Hillard Danker Orders Received? Or Actions Taken?: patient in waiting room-will move to room next

## 2016-11-21 NOTE — ED Notes (Signed)
Patient transported to CT 

## 2016-11-21 NOTE — Discharge Instructions (Addendum)
Your CT scan showed signs of colitis. Please take the antibiotics including Flagyl and Cipro as prescribed for 7 days. Have been giving her first dose in the ED today.Potassium was also low. Make sure you take your potassium supplements as prescribed and follow-up with her primary care doctor to have your potassium rechecked. Have given you Bentyl which helps with intestinal spasms. May take Motrin and Tylenol for pain as needed. Clear liquid diet for 24 hours and advance her diet as tolerated with bland foods.If you are unable to keep her antibiotics down or have excessive diarrhea and dehydration return to the ED. She follow-up with her primary care doctor next week.

## 2016-11-21 NOTE — ED Provider Notes (Signed)
[facility] DEPT Provider Note   CSN: [id number] Arrival date & time: 11/21/16  1715     History   Chief Complaint Chief Complaint  Patient presents with  . Diarrhea  . Rectal Bleeding    HPI  Melinda Mckay is a 71 y.o. Female with a history of hypertension, GERD, who presents complaining of 48 hours of diarrhea with red blood colored stools starting last night. Patient reports associated lower abdominal discomfort, and some nausea, no vomiting. Patient reports she feels like she needs to go to the bathroom about every 15 minutes, but very little stool comes out. She reports stools have been reddish colored, but she has not had frank blood in the toilet after a bowel movement. She describes the lower abdominal pain as an intermittent dull achy spasm that lasts for about 30 seconds and then resolves, after this pain she often feels the need to go to the bathroom. She tried some tylenol this morning which helped some. Patient has tried drinking ginger ale and water, but has not had any solid food since last night. She denies a history of colitis personally but has family history of colitis as well as colon cancer. Up to date on colonoscopy. No recent antibiotic use. Denies urinary symptoms, fever, chills, chest pain or shortness of breath. Had hysterectomy, no other abdominal surgeries.      Past Medical History:  Diagnosis Date  . Allergic   . Asthma   . Hypertension     Patient Active Problem List   Diagnosis Date Noted  . Mild persistent asthma in adult without complication 67/67/2094    Past Surgical History:  Procedure Laterality Date  . TOTAL ABDOMINAL HYSTERECTOMY  2009  . TUBAL LIGATION  1982    OB History    No data available       Home Medications    Prior to Admission medications   Medication Sig Start Date End Date Taking? Authorizing Provider  albuterol (PROAIR HFA) 108 (90 Base) MCG/ACT inhaler 2 puffs every 4 hours as needed only  if your can't  catch your breath 09/11/15   Tanda Rockers, MD  amLODipine (NORVASC) 5 MG tablet Take 1 tablet by mouth daily. 11/03/13   [provider]  aspirin 81 MG chewable tablet Chew 81 mg by mouth daily.      [provider]  beclomethasone (QVAR REDIHALER) 80 MCG/ACT inhaler Inhale 2 puffs into the lungs daily. 09/10/16   Tanda Rockers, MD  Biotin 1000 MCG tablet Take 1,000 mcg by mouth daily.    [provider]  esomeprazole (NEXIUM) 40 MG capsule Take 40 mg by mouth daily as needed.    [provider]  hydrochlorothiazide (HYDRODIURIL) 25 MG tablet Take 25 mg by mouth daily.    [provider]  hydrOXYzine (ATARAX/VISTARIL) 25 MG tablet Take 25 mg by mouth 3 (three) times daily as needed.      [provider]  Multiple Vitamins-Minerals (MULTIVITAMIN & MINERAL PO) Take 1 tablet by mouth daily.    [provider]  rosuvastatin (CRESTOR) 20 MG tablet Take 1 tablet by mouth daily. 07/21/15   [provider]    Family History Family History  Problem Relation Age of Onset  . Cancer Sister        KIDNEY   . Allergies Sister   . Asthma Sister   . Allergies Brother   . Asthma Brother     Social History Social History  Substance Use  Topics  . Smoking status: Former Smoker    Packs/day: 0.40    Years: 20.00    Types: Cigarettes    Quit date: 03/11/1993  . Smokeless tobacco: Never Used     Comment: PT STATES ABOUT 1 PACK A WEEK   . Alcohol use Yes     Allergies   Patient has no known allergies.   Review of Systems Review of Systems  Constitutional: Negative for chills and fever.  HENT: Negative for congestion, ear pain, rhinorrhea and sore throat.   Eyes: Negative for photophobia and visual disturbance.  Respiratory: Negative for cough, chest tightness and shortness of breath.   Cardiovascular: Negative for chest pain, palpitations and leg swelling.  Gastrointestinal: Positive for abdominal pain, blood in stool,  diarrhea and nausea. Negative for rectal pain and vomiting.  Genitourinary: Negative for difficulty urinating, dysuria and flank pain.  Musculoskeletal: Negative for arthralgias and myalgias.  Skin: Negative for pallor and rash.  Neurological: Negative for dizziness, weakness, numbness and headaches.     Physical Exam Updated Vital Signs BP (!) 144/82 (BP Location: Right Arm)   Pulse 67   Temp 98.2 F (36.8 C) (Oral)   Resp 14   Ht 5' 5.5" (1.664 m)   Wt 80.3 kg (177 lb)   SpO2 97%   BMI 29.01 kg/m   Physical Exam  Constitutional: She appears well-developed and well-nourished. No distress.  HENT:  Head: Normocephalic and atraumatic.  Eyes: Right eye exhibits no discharge. Left eye exhibits no discharge.  Cardiovascular: Normal rate, regular rhythm, normal heart sounds and intact distal pulses.   Pulmonary/Chest: Effort normal and breath sounds normal. No respiratory distress.  Abdominal: Soft. Bowel sounds are normal. She exhibits no distension and no mass. There is no tenderness. There is no guarding.  On Rectal exam no hemorrhoids palpated, no frank blood  Musculoskeletal: She exhibits no edema or deformity.  Neurological: She is alert. Coordination normal.  Speech is clear, able to follow commands CN III-XII intact Normal strength in upper and lower extremities bilaterally including dorsiflexion and plantar flexion, strong and equal grip strength Sensation normal to light and sharp touch Moves extremities without ataxia, coordination intact  Skin: Skin is warm and dry. Capillary refill takes less than 2 seconds. She is not diaphoretic. No pallor.  Psychiatric: She has a normal mood and affect. Her behavior is normal.  Nursing note and vitals reviewed.    ED Treatments / Results  Labs (all labs ordered are listed, but only abnormal results are displayed) Labs Reviewed  COMPREHENSIVE METABOLIC PANEL - Abnormal; Notable for the following:       Result Value    Potassium 2.4 (*)    Chloride 98 (*)    All other components within normal limits  POC OCCULT BLOOD, ED - Abnormal; Notable for the following:    Fecal Occult Bld POSITIVE (*)    All other components within normal limits  CBC  TYPE AND SCREEN  ABO/RH    EKG  EKG Interpretation None       Radiology No results found.  Procedures Procedures (including critical care time)  Medications Ordered in ED Medications  potassium chloride 10 mEq in 100 mL IVPB (not administered)  potassium chloride 10 mEq in 100 mL IVPB (10 mEq Intravenous New Bag/Given 11/21/16 2231)  magnesium sulfate IVPB 1 g 100 mL (1 g Intravenous New Bag/Given 11/21/16 2231)  acetaminophen (TYLENOL) tablet 1,000 mg (not administered)  iopamidol (ISOVUE-300) 61 % injection (not administered)  potassium chloride SA (K-DUR,KLOR-CON) CR tablet 40 mEq (40 mEq Oral Given 11/21/16 2239)  sodium chloride 0.9 % bolus 1,000 mL (1,000 mLs Intravenous New Bag/Given 11/21/16 2232)     Initial Impression / Assessment and Plan / ED Course  I have reviewed the triage vital signs and the nursing notes.  Pertinent labs & imaging results that were available during my care of the patient were reviewed by me and considered in my medical decision making (see chart for details).  Patient presents with 2 days diarrhea and lower abdominal with some bloody stools. Patient is not tachycardic or hypotensive, afebrile. Hemoglobin is 14, no concern for acute hemorrhage. Patient is non-tender on exam, but has multiple short spasms of pain during eval. No leukocytosis, Potassium is 2.4 and will need to be replaced, Hemoccult is positive, labs otherwise unremarkable. Likely colitis, no recent antibiotic use, less likely diverticulitis but patient does have a remote history.  Will replace K+ and mag and give 1L fluid bolus. Shared decision making conversation with patient regarding CT Scan versus discharging home with antibiotics and patient would  prefer to go ahead and have CT rather than go home and it get worse with impending inclement weather.   CT abdomen pelvis ordered, if scan shows colitis patient discharged home with cipro & flagyl, bentyl and oral potassium replacement for pain relief. If it is completely negative D/C with bentyl and potassium +/- flagyl.  Patient discussed with Dr. Dorie Rank, who saw patient as well and agrees with plan.  At shift change care was transferred to [facility] who will follow pending studies, re-evaulate and determine disposition.     Final Clinical Impressions(s) / ED Diagnoses   Final diagnoses:  Diarrhea, unspecified type  Lower abdominal pain  Bloody stools    New Prescriptions New Prescriptions   No medications on file       Janet Berlin 11/22/16 Cheral Marker, MD 11/22/16 0005

## 2016-11-22 DIAGNOSIS — K529 Noninfective gastroenteritis and colitis, unspecified: Secondary | ICD-10-CM | POA: Diagnosis not present

## 2016-11-22 MED ORDER — DICYCLOMINE HCL 20 MG PO TABS: 20.0000 mg | ORAL_TABLET | Freq: Two times a day (BID) | ORAL | 0 refills | Status: DC

## 2016-11-22 MED ORDER — CIPROFLOXACIN HCL 500 MG PO TABS: 500.0000 mg | ORAL_TABLET | Freq: Two times a day (BID) | ORAL | 0 refills | Status: DC

## 2016-11-22 MED ORDER — METRONIDAZOLE 500 MG PO TABS: 500.0000 mg | ORAL_TABLET | Freq: Three times a day (TID) | ORAL | 0 refills | Status: DC

## 2016-11-22 MED ORDER — POTASSIUM CHLORIDE CRYS ER 20 MEQ PO TBCR: 20.0000 meq | EXTENDED_RELEASE_TABLET | Freq: Two times a day (BID) | ORAL | 0 refills | Status: DC

## 2016-11-22 MED ORDER — CIPROFLOXACIN HCL 500 MG PO TABS
500.0000 mg | ORAL_TABLET | Freq: Once | ORAL | Status: AC
  Administered 2016-11-22: 500 mg via ORAL
  Filled 2016-11-22: qty 1

## 2016-11-22 MED ORDER — METRONIDAZOLE 500 MG PO TABS
500.0000 mg | ORAL_TABLET | Freq: Once | ORAL | Status: AC
  Administered 2016-11-22: 500 mg via ORAL
  Filled 2016-11-22: qty 1

## 2016-11-22 NOTE — ED Provider Notes (Signed)
Care assumed from previous provider PA Mills. Please see their note for further details to include full history and physical. To summarize in short pt is a 71 year old female is coming in for evaluation of diarrhea and red-colored stools with generalized abdominal pain. Case discussed, plan agreed upon. At handoff awaiting ct scan and potassium replacement. Ct scan returned.  IMPRESSION:  1. Mild pancolitis greatest in the sigmoid colon. No evidence for  perforation or abscess.  2. Otherwise stable chronic findings as above.     Will treat patient with Cipro and Flagyl have given first dose in the ED. Patient tolerating by mouth fluids without any emesis.Potassium has been replaced with oral and IV potassium. Will be discharged home on oral potassium with close follow-up with PCP. Hypokalemia likely due to hydrochlorothiazide and diarrhea. Patient has no white count. She is afebrile. Repeat abdominal exam is benign without any signs of peritonitis. The patient feels stable for discharge.  Pt is hemodynamically stable, in NAD, & able to ambulate in the ED. Evaluation does not show pathology that would require ongoing emergent intervention or inpatient treatment. I explained the diagnosis to the patient. Pain has been managed & has no complaints prior to dc. Pt is comfortable with above plan and is stable for discharge at this time. All questions were answered prior to disposition. Strict return precautions for f/u to the ED were discussed. Encouraged follow up with PCP.      Doristine Devoid, PA-C 11/22/16 Allie Dimmer, April, MD 11/22/16 [phone]

## 2017-01-02 DIAGNOSIS — E663 Overweight: Secondary | ICD-10-CM | POA: Diagnosis not present

## 2017-01-02 DIAGNOSIS — Z1231 Encounter for screening mammogram for malignant neoplasm of breast: Secondary | ICD-10-CM | POA: Diagnosis not present

## 2017-01-02 DIAGNOSIS — Z23 Encounter for immunization: Secondary | ICD-10-CM | POA: Diagnosis not present

## 2017-01-02 DIAGNOSIS — Z136 Encounter for screening for cardiovascular disorders: Secondary | ICD-10-CM | POA: Diagnosis not present

## 2017-01-02 DIAGNOSIS — H6123 Impacted cerumen, bilateral: Secondary | ICD-10-CM | POA: Diagnosis not present

## 2017-01-02 DIAGNOSIS — Z1211 Encounter for screening for malignant neoplasm of colon: Secondary | ICD-10-CM | POA: Diagnosis not present

## 2017-01-02 DIAGNOSIS — Z13820 Encounter for screening for osteoporosis: Secondary | ICD-10-CM | POA: Diagnosis not present

## 2017-01-02 DIAGNOSIS — Z0001 Encounter for general adult medical examination with abnormal findings: Secondary | ICD-10-CM | POA: Diagnosis not present

## 2017-01-02 DIAGNOSIS — L502 Urticaria due to cold and heat: Secondary | ICD-10-CM | POA: Diagnosis not present

## 2017-01-10 ENCOUNTER — Other Ambulatory Visit: Payer: Self-pay | Admitting: Internal Medicine

## 2017-01-10 DIAGNOSIS — Z139 Encounter for screening, unspecified: Secondary | ICD-10-CM

## 2017-02-05 ENCOUNTER — Ambulatory Visit
Admission: RE | Admit: 2017-02-05 | Discharge: 2017-02-05 | Disposition: A | Discharge status: Home / Self Care | Payer: Medicare Other | Source: Ambulatory Visit | Attending: Internal Medicine | Admitting: Internal Medicine

## 2017-02-05 DIAGNOSIS — Z139 Encounter for screening, unspecified: Secondary | ICD-10-CM

## 2017-02-28 ENCOUNTER — Encounter (HOSPITAL_COMMUNITY): Payer: Self-pay

## 2017-02-28 ENCOUNTER — Emergency Department (HOSPITAL_COMMUNITY): Payer: BC Managed Care – PPO

## 2017-02-28 ENCOUNTER — Other Ambulatory Visit: Payer: Self-pay

## 2017-02-28 ENCOUNTER — Emergency Department (HOSPITAL_COMMUNITY)
Admission: EM | Admit: 2017-02-28 | Discharge: 2017-02-28 | Disposition: A | Discharge status: Home / Self Care | Payer: BC Managed Care – PPO | Attending: Emergency Medicine | Admitting: Emergency Medicine

## 2017-02-28 DIAGNOSIS — I10 Essential (primary) hypertension: Secondary | ICD-10-CM | POA: Insufficient documentation

## 2017-02-28 DIAGNOSIS — R51 Headache: Secondary | ICD-10-CM | POA: Insufficient documentation

## 2017-02-28 DIAGNOSIS — R202 Paresthesia of skin: Secondary | ICD-10-CM | POA: Insufficient documentation

## 2017-02-28 DIAGNOSIS — Z87891 Personal history of nicotine dependence: Secondary | ICD-10-CM | POA: Insufficient documentation

## 2017-02-28 DIAGNOSIS — R200 Anesthesia of skin: Secondary | ICD-10-CM | POA: Diagnosis not present

## 2017-02-28 DIAGNOSIS — G458 Other transient cerebral ischemic attacks and related syndromes: Secondary | ICD-10-CM | POA: Diagnosis not present

## 2017-02-28 DIAGNOSIS — R42 Dizziness and giddiness: Secondary | ICD-10-CM | POA: Insufficient documentation

## 2017-02-28 DIAGNOSIS — Z79899 Other long term (current) drug therapy: Secondary | ICD-10-CM | POA: Diagnosis not present

## 2017-02-28 DIAGNOSIS — J45909 Unspecified asthma, uncomplicated: Secondary | ICD-10-CM | POA: Diagnosis not present

## 2017-02-28 DIAGNOSIS — E785 Hyperlipidemia, unspecified: Secondary | ICD-10-CM | POA: Diagnosis not present

## 2017-02-28 DIAGNOSIS — E876 Hypokalemia: Secondary | ICD-10-CM | POA: Diagnosis not present

## 2017-02-28 DIAGNOSIS — R7303 Prediabetes: Secondary | ICD-10-CM | POA: Diagnosis not present

## 2017-02-28 LAB — I-STAT CHEM 8, ED
BUN: 18 mg/dL (ref 6–20)
Calcium, Ion: 1.22 mmol/L (ref 1.15–1.40)
Chloride: 100 mmol/L — ABNORMAL LOW (ref 101–111)
Creatinine, Ser: 0.8 mg/dL (ref 0.44–1.00)
Glucose, Bld: 99 mg/dL (ref 65–99)
HEMATOCRIT: 44 % (ref 36.0–46.0)
Hemoglobin: 15 g/dL (ref 12.0–15.0)
Potassium: 3 mmol/L — ABNORMAL LOW (ref 3.5–5.1)
SODIUM: 144 mmol/L (ref 135–145)
TCO2: 33 mmol/L — AB (ref 22–32)

## 2017-02-28 LAB — COMPREHENSIVE METABOLIC PANEL
ALBUMIN: 3.4 g/dL — AB (ref 3.5–5.0)
ALT: 17 U/L (ref 14–54)
AST: 19 U/L (ref 15–41)
Alkaline Phosphatase: 69 U/L (ref 38–126)
Anion gap: 7 (ref 5–15)
BUN: 15 mg/dL (ref 6–20)
CHLORIDE: 102 mmol/L (ref 101–111)
CO2: 30 mmol/L (ref 22–32)
Calcium: 9.5 mg/dL (ref 8.9–10.3)
Creatinine, Ser: 0.72 mg/dL (ref 0.44–1.00)
GFR calc Af Amer: >60 mL/min (ref >60)
GLUCOSE: 102 mg/dL — AB (ref 65–99)
POTASSIUM: 2.9 mmol/L — AB (ref 3.5–5.1)
Sodium: 139 mmol/L (ref 135–145)
Total Bilirubin: 0.6 mg/dL (ref 0.3–1.2)
Total Protein: 7.2 g/dL (ref 6.5–8.1)

## 2017-02-28 LAB — PROTIME-INR
INR: 1.05
Prothrombin Time: 13.6 seconds (ref 11.4–15.2)

## 2017-02-28 LAB — DIFFERENTIAL
BASOS ABS: 0 10*3/uL (ref 0.0–0.1)
BASOS PCT: 0 %
EOS ABS: 0.2 10*3/uL (ref 0.0–0.7)
Eosinophils Relative: 4 %
Lymphocytes Relative: 33 %
Lymphs Abs: 1.6 10*3/uL (ref 0.7–4.0)
Monocytes Absolute: 0.6 10*3/uL (ref 0.1–1.0)
Monocytes Relative: 12 %
NEUTROS PCT: 51 %
Neutro Abs: 2.6 10*3/uL (ref 1.7–7.7)

## 2017-02-28 LAB — CBC
HCT: 42.7 % (ref 36.0–46.0)
Hemoglobin: 13.7 g/dL (ref 12.0–15.0)
MCH: 29.1 pg (ref 26.0–34.0)
MCHC: 32.1 g/dL (ref 30.0–36.0)
MCV: 90.7 fL (ref 78.0–100.0)
Platelets: 283 10*3/uL (ref 150–400)
RBC: 4.71 MIL/uL (ref 3.87–5.11)
RDW: 13.9 % (ref 11.5–15.5)
WBC: 5 10*3/uL (ref 4.0–10.5)

## 2017-02-28 LAB — APTT: APTT: 30 s (ref 24–36)

## 2017-02-28 LAB — I-STAT TROPONIN, ED: TROPONIN I, POC: 0 ng/mL (ref 0.00–0.08)

## 2017-02-28 LAB — CBG MONITORING, ED: Glucose-Capillary: 72 mg/dL (ref 65–99)

## 2017-02-28 MED ORDER — POTASSIUM CHLORIDE CRYS ER 20 MEQ PO TBCR
40.0000 meq | EXTENDED_RELEASE_TABLET | Freq: Once | ORAL | Status: AC
  Administered 2017-02-28: 40 meq via ORAL
  Filled 2017-02-28: qty 2

## 2017-02-28 MED ORDER — IOPAMIDOL (ISOVUE-370) INJECTION 76%
INTRAVENOUS | Status: AC
  Administered 2017-02-28: 50 mL
  Filled 2017-02-28: qty 50

## 2017-02-28 NOTE — Discharge Instructions (Signed)
Please schedule appointment with the neurologist  in 1-2 weeks to follow-up on your intermittent symptoms.  An MRI of your neck and other tests may be recommended next.  It is important that you take your potassium supplement daily, as prescribed. Return to the ER immediately if you begin having facial numbness or droop, slurred speech, severe headache, or new or concerning symptoms.

## 2017-02-28 NOTE — ED Triage Notes (Signed)
Pt states she has been having intermittent dizziness and numbness in the right arm X6 weeks. She was sent over for further eval by her PCP. Pt AOX4.

## 2017-02-28 NOTE — ED Notes (Signed)
Pt CBG 72; pt a&ox4. Given 8 oz orange juice.

## 2017-02-28 NOTE — ED Provider Notes (Signed)
[facility] Provider Note   CSN: [id number] Arrival date & time: 02/28/17  1326     History   Chief Complaint Chief Complaint  Patient presents with  . Numbness  . Dizziness    HPI Melinda Mckay is a 71 y.o. female past medical history of hypertension, hyperlipidemia, presenting from PCP for intermittent episodes of RUE and RLE numbness/tingling. Pt states she has had about 4 episodes throughout the past 6 weeks, each lasting about 30 minutes a time.  She reports some dizziness during the episodes and sometimes a mild headache.  Denies any facial numbness or droop.  Denies neck pain or back pain, chest pain or SOB.  States from the symptoms occur she is generally busy doing something at the time.  Denies history of stroke or blood clot. States she takes potassium supplement, however has not had any few days. The history is provided by the patient.    Past Medical History:  Diagnosis Date  . Allergic   . Asthma   . Hypertension     Patient Active Problem List   Diagnosis Date Noted  . Mild persistent asthma in adult without complication 01/02/8526    Past Surgical History:  Procedure Laterality Date  . TOTAL ABDOMINAL HYSTERECTOMY  2009  . TUBAL LIGATION  1982    OB History    No data available       Home Medications    Prior to Admission medications   Medication Sig Start Date End Date Taking? Authorizing Provider  acetaminophen (TYLENOL) 500 MG tablet Take 500 mg by mouth every 6 (six) hours as needed for mild pain.   Yes [provider]  albuterol (PROAIR HFA) 108 (90 Base) MCG/ACT inhaler 2 puffs every 4 hours as needed only  if your can't catch your breath 09/11/15  Yes Tanda Rockers, MD  amLODipine (NORVASC) 10 MG tablet Take 1 tablet by mouth daily. 11/03/13  Yes [provider]  aspirin 81 MG chewable tablet Chew 81 mg by mouth daily.     Yes [provider]  beclomethasone (QVAR  REDIHALER) 80 MCG/ACT inhaler Inhale 2 puffs into the lungs daily. 09/10/16  Yes Tanda Rockers, MD  esomeprazole (NEXIUM) 40 MG capsule Take 40 mg by mouth daily as needed.   Yes [provider]  hydrochlorothiazide (HYDRODIURIL) 25 MG tablet Take 25 mg by mouth daily.   Yes [provider]  hydrOXYzine (ATARAX/VISTARIL) 25 MG tablet Take 25 mg by mouth 3 (three) times daily as needed.     Yes [provider]  ciprofloxacin (CIPRO) 500 MG tablet Take 1 tablet (500 mg total) by mouth every 12 (twelve) hours. Patient not taking: Reported on 02/28/2017 11/22/16   Ocie Cornfield T, PA-C  dicyclomine (BENTYL) 20 MG tablet Take 1 tablet (20 mg total) by mouth 2 (two) times daily. Patient not taking: Reported on 02/28/2017 11/22/16   Ocie Cornfield T, PA-C  metroNIDAZOLE (FLAGYL) 500 MG tablet Take 1 tablet (500 mg total) by mouth 3 (three) times daily. DO NOT CONSUME ALCOHOL WHILE TAKING THIS MEDICATION. Patient not taking: Reported on 02/28/2017 11/22/16   Ocie Cornfield T, PA-C  potassium chloride SA (K-DUR,KLOR-CON) 20 MEQ tablet Take 1 tablet (20 mEq total) by mouth 2 (two) times daily. Patient not taking: Reported on 02/28/2017 11/22/16   Doristine Devoid, PA-C    Family History Family History  Problem Relation Age of Onset  . Cancer Sister  KIDNEY   . Allergies Sister   . Asthma Sister   . Allergies Brother   . Asthma Brother     Social History Social History   Tobacco Use  . Smoking status: Former Smoker    Packs/day: 0.40    Years: 20.00    Pack years: 8.00    Types: Cigarettes    Last attempt to quit: 03/11/1993    Years since quitting: 23.9  . Smokeless tobacco: Never Used  . Tobacco comment: PT STATES ABOUT 1 PACK A WEEK   Substance Use Topics  . Alcohol use: Yes  . Drug use: No     Allergies   Patient has no known allergies.   Review of Systems Review of Systems  Eyes: Negative for photophobia and visual disturbance.    Neurological: Positive for dizziness, numbness and headaches. Negative for syncope, speech difficulty and weakness.  All other systems reviewed and are negative.    Physical Exam Updated Vital Signs BP (!) 148/94   Pulse 61   Temp 98 F (36.7 C) (Oral)   Resp 15   SpO2 99%   Physical Exam  Constitutional: She is oriented to person, place, and time. She appears well-developed and well-nourished. No distress.  HENT:  Head: Normocephalic and atraumatic.  Mouth/Throat: Oropharynx is clear and moist.  Eyes: Conjunctivae and EOM are normal. Pupils are equal, round, and reactive to light.  Neck: Normal range of motion. Neck supple.  Cardiovascular: Regular rhythm, normal heart sounds and intact distal pulses.  Slightly bradycardic  Pulmonary/Chest: Effort normal and breath sounds normal. No respiratory distress.  Abdominal: Soft. Bowel sounds are normal. She exhibits no distension. There is no tenderness.  Musculoskeletal: Normal range of motion.  Neurological: She is alert and oriented to person, place, and time.  Mental Status:  Alert, oriented, thought content appropriate, able to give a coherent history. Speech fluent without evidence of aphasia. Able to follow 2 step commands without difficulty.  Cranial Nerves:  II:  Peripheral visual fields grossly normal, pupils equal, round, reactive to light III,IV, VI: ptosis not present, extra-ocular motions intact bilaterally  V,VII: smile symmetric, facial light touch sensation equal VIII: hearing grossly normal to voice  X: uvula elevates symmetrically  XI: bilateral shoulder shrug symmetric and strong XII: midline tongue extension without fassiculations Motor:  Normal tone. 5/5 in upper and lower extremities bilaterally including strong and equal grip strength and dorsiflexion/plantar flexion Sensory: Pinprick and light touch normal in all extremities.  Deep Tendon Reflexes: 2+ and symmetric in the biceps and patella Cerebellar:  normal finger-to-nose with bilateral upper extremities Gait: normal gait and balance CV: distal pulses palpable throughout    Skin: Skin is warm.  Psychiatric: She has a normal mood and affect. Her behavior is normal.  Nursing note and vitals reviewed.    ED Treatments / Results  Labs (all labs ordered are listed, but only abnormal results are displayed) Labs Reviewed  COMPREHENSIVE METABOLIC PANEL - Abnormal; Notable for the following components:      Result Value   Potassium 2.9 (*)    Glucose, Bld 102 (*)    Albumin 3.4 (*)    All other components within normal limits  I-STAT CHEM 8, ED - Abnormal; Notable for the following components:   Potassium 3.0 (*)    Chloride 100 (*)    TCO2 33 (*)    All other components within normal limits  PROTIME-INR  APTT  CBC  DIFFERENTIAL  I-STAT TROPONIN, ED  CBG  MONITORING, ED    EKG  EKG Interpretation None       Radiology Ct Angio Head W/cm &/or Wo Cm  Result Date: 02/28/2017 CLINICAL DATA:  Initial evaluation for intermittent dizziness, right upper extremity numbness and tingling. EXAM: CT ANGIOGRAPHY HEAD AND NECK TECHNIQUE: Multidetector CT imaging of the head and neck was performed using the standard protocol during bolus administration of intravenous contrast. Multiplanar CT image reconstructions and MIPs were obtained to evaluate the vascular anatomy. Carotid stenosis measurements (when applicable) are obtained utilizing NASCET criteria, using the distal internal carotid diameter as the denominator. CONTRAST:  <See Chart> ISOVUE-370 IOPAMIDOL (ISOVUE-370) INJECTION 76% COMPARISON:  Prior CT from earlier same day. FINDINGS: CTA NECK FINDINGS Aortic arch: Visualized aortic arch of normal caliber with normal 3 vessel morphology. Mild atheromatous plaque within the arch itself and about the origin of the great vessels without flow-limiting stenosis. The visualized subclavian artery is widely patent. Right carotid system: Right  common and internal carotid artery is widely patent without stenosis, dissection, or occlusion. Left carotid system: Left common and internal carotid artery is widely patent without stenosis, dissection, or occlusion. Vertebral arteries: Both of the vertebral arteries arise from the subclavian arteries. Right vertebral artery dominant. Vertebral arteries widely patent within the neck without stenosis, dissection, or occlusion. Skeleton: No acute osseus abnormality. No worrisome lytic or blastic osseous lesions. Grade 1 anterolisthesis of C7 on T1 noted. Moderate spondylolysis present at C4-5 through C6-7. Other neck: No acute soft tissue abnormality within the neck. No adenopathy. Thyroid normal. Salivary glands normal. Upper chest: Visualized upper chest within normal limits. Review of the MIP images confirms the above findings CTA HEAD FINDINGS Anterior circulation: Internal carotid arteries widely patent to the termini without stenosis. A1 segments patent. Left A1 segment hypoplastic. Normal anterior communicating artery. Anterior cerebral artery is patent to their distal aspects without stenosis. M1 segments patent without stenosis. Normal MCA bifurcations. Distal MCA branches well perfused and symmetric. Posterior circulation: Dominant right vertebral artery widely patent vertebrobasilar junction. Left V4 segment fenestrated and widely patent as well. Patent right PICA. Left PICA not visualized. Basilar artery widely patent to its distal aspect. Superior cerebellar and posterior cerebral arteries well perfused bilaterally without stenosis. The Venous sinuses: Grossly patent. Anatomic variants: None significant. No aneurysm or other vascular abnormality. Delayed phase: No abnormal enhancement. Review of the MIP images confirms the above findings IMPRESSION: Normal CTA of the head and neck. No large vessel occlusion. No high-grade or correctable stenosis. Electronically Signed   By: Jeannine Boga M.D.    On: 02/28/2017 22:05   Ct Head Wo Contrast  Result Date: 02/28/2017 CLINICAL DATA:  Intermittent dizziness and right arm numbness for the past 6 weeks. EXAM: CT HEAD WITHOUT CONTRAST TECHNIQUE: Contiguous axial images were obtained from the base of the skull through the vertex without intravenous contrast. COMPARISON:  None. FINDINGS: Brain: Diffusely enlarged ventricles and subarachnoid spaces. Enlarged, fluid-filled sella turcica. No intracranial hemorrhage, mass lesion or CT evidence of acute infarction. Vascular: No hyperdense vessel or unexpected calcification. Skull: Mild bilateral hyperostosis frontalis. Enlarged sella turcica. Sinuses/Orbits: Mild right ethmoid sinus mucosal thickening. Unremarkable orbits. Other: None. IMPRESSION: 1. Empty sella. 2. Mild diffuse cerebral and cerebellar atrophy. 3. Mild chronic right ethmoid sinusitis. 4. No acute abnormality. Electronically Signed   By: Claudie Revering M.D.   On: 02/28/2017 14:39   Ct Angio Neck W And/or Wo Contrast  Result Date: 02/28/2017 CLINICAL DATA:  Initial evaluation for intermittent dizziness, right upper extremity  numbness and tingling. EXAM: CT ANGIOGRAPHY HEAD AND NECK TECHNIQUE: Multidetector CT imaging of the head and neck was performed using the standard protocol during bolus administration of intravenous contrast. Multiplanar CT image reconstructions and MIPs were obtained to evaluate the vascular anatomy. Carotid stenosis measurements (when applicable) are obtained utilizing NASCET criteria, using the distal internal carotid diameter as the denominator. CONTRAST:  <See Chart> ISOVUE-370 IOPAMIDOL (ISOVUE-370) INJECTION 76% COMPARISON:  Prior CT from earlier same day. FINDINGS: CTA NECK FINDINGS Aortic arch: Visualized aortic arch of normal caliber with normal 3 vessel morphology. Mild atheromatous plaque within the arch itself and about the origin of the great vessels without flow-limiting stenosis. The visualized subclavian artery  is widely patent. Right carotid system: Right common and internal carotid artery is widely patent without stenosis, dissection, or occlusion. Left carotid system: Left common and internal carotid artery is widely patent without stenosis, dissection, or occlusion. Vertebral arteries: Both of the vertebral arteries arise from the subclavian arteries. Right vertebral artery dominant. Vertebral arteries widely patent within the neck without stenosis, dissection, or occlusion. Skeleton: No acute osseus abnormality. No worrisome lytic or blastic osseous lesions. Grade 1 anterolisthesis of C7 on T1 noted. Moderate spondylolysis present at C4-5 through C6-7. Other neck: No acute soft tissue abnormality within the neck. No adenopathy. Thyroid normal. Salivary glands normal. Upper chest: Visualized upper chest within normal limits. Review of the MIP images confirms the above findings CTA HEAD FINDINGS Anterior circulation: Internal carotid arteries widely patent to the termini without stenosis. A1 segments patent. Left A1 segment hypoplastic. Normal anterior communicating artery. Anterior cerebral artery is patent to their distal aspects without stenosis. M1 segments patent without stenosis. Normal MCA bifurcations. Distal MCA branches well perfused and symmetric. Posterior circulation: Dominant right vertebral artery widely patent vertebrobasilar junction. Left V4 segment fenestrated and widely patent as well. Patent right PICA. Left PICA not visualized. Basilar artery widely patent to its distal aspect. Superior cerebellar and posterior cerebral arteries well perfused bilaterally without stenosis. The Venous sinuses: Grossly patent. Anatomic variants: None significant. No aneurysm or other vascular abnormality. Delayed phase: No abnormal enhancement. Review of the MIP images confirms the above findings IMPRESSION: Normal CTA of the head and neck. No large vessel occlusion. No high-grade or correctable stenosis.  Electronically Signed   By: Jeannine Boga M.D.   On: 02/28/2017 22:05    Procedures Procedures (including critical care time)  Medications Ordered in ED Medications  potassium chloride SA (K-DUR,KLOR-CON) CR tablet 40 mEq (40 mEq Oral Given 02/28/17 1921)  iopamidol (ISOVUE-370) 76 % injection (50 mLs  Contrast Given 02/28/17 2105)     Initial Impression / Assessment and Plan / ED Course  I have reviewed the triage vital signs and the nursing notes.  Pertinent labs & imaging results that were available during my care of the patient were reviewed by me and considered in my medical decision making (see chart for details).  Clinical Course as of Feb 28 2226  Fri Feb 28, 2017  1950 Patient discussed with Dr. Lorraine Lax with neurology, he will evaluate patient and determine plan.  [JR]  2030 Dr. Lorraine Lax recommending CTA head and neck. If neg, patient is safe for   [JR]    Clinical Course User Index [JR] Robinson, [place] N, PA-C    Presenting with intermittent symptoms of right upper and lower extremity numbness and tingling.  No facial involvement.  Normal neurologic exam.  Labs showing hypokalemia, without EKG changes.  Patient stopped taking potassium supplement however  will recommend she continue retaking.  Potassium placed in the ED.  CT had done in triage and negative.  Neurology consulted regarding patient's presentation.  Patient evaluated by Dr. Lorraine Lax, who recommended CTA of the head and neck.  States times are unlikely due to TIA.  He states if imaging is negative, she is safe for discharge with outpatient neurology follow-up.  CTA head and neck negative.  Discussed these results with patient, and plan for outpatient neurology follow-up.  Patient agrees with plan.  Strict return precautions discussed.  Safe for discharge.  Discussed with and seen by Dr. Dayna Barker, who agrees with care plan.  Pt discussed results, findings, treatment and follow up. Patient advised of return  precautions. Patient verbalized understanding and agreed with plan.   Final Clinical Impressions(s) / ED Diagnoses   Final diagnoses:  Paresthesias    ED Discharge Orders    None       Robinson, [place] N, PA-C 02/28/17 2230    Mesner, Corene Cornea, MD 03/01/17 0005

## 2017-02-28 NOTE — Consult Note (Signed)
Neurology Consultation Reason for Consult: Recurrent numbness in Right hand Referring Physician: [place] Robinson [facility]   History is obtained from: patient   HPI: Melinda Mckay is a 71 y.o. female with past medical history for hypertension, remote history of smoking who presents with recurrent numbness on the right hand since the last 6 weeks. She states that she has had about 4 episodes where she develops numbness and tingling of the right hand which gradually most upwards up to the forearm. They're always associated with activity and when the patient is in a hurry. On one occasion she had numbness over her right thigh. She has not had any episodes associated with slurred speech, numbness in the face, any weakness or gait imbalance. She states she has a mild headache on a few of these episodes. They last for about 30 minutes and completely resolve spontaneously. Recent episode occurred while she was driving last Wednesday.Her blood pressure was 1 48 / 94 in the emergency room.   Currently feels back to her normal self. She has no other neurological symptoms. She states that she takes a potassium supplement and noted to have low potassium in the emergency room.  ROS: A 14 point ROS was performed and is negative except as noted in the HPI.   Past Medical History:  Diagnosis Date  . Allergic   . Asthma   . Hypertension      Family History  Problem Relation Age of Onset  . Cancer Sister        KIDNEY   . Allergies Sister   . Asthma Sister   . Allergies Brother   . Asthma Brother      Social History:  reports that she quit smoking about 23 years ago. Her smoking use included cigarettes. She has a 8.00 pack-year smoking history. she has never used smokeless tobacco. She reports that she drinks alcohol. She reports that she does not use drugs.   Exam: Current vital signs: BP (!) 148/94   Pulse 61   Temp 98 F (36.7 C) (Oral)   Resp 15   SpO2 99%  Vital signs in last 24  hours: Temp:  [98 F (36.7 C)-98.3 F (36.8 C)] 98 F (36.7 C) (12/21 1742) Pulse Rate:  [51-66] 61 (12/21 2045) Resp:  [8-23] 15 (12/21 2045) BP: (131-163)/(72-102) 148/94 (12/21 2045) SpO2:  [96 %-100 %] 99 % (12/21 2045)   Physical Exam  Constitutional: Appears well-developed and well-nourished.  Psych: Affect appropriate to situation Eyes: No scleral injection HENT: No OP obstrucion Head: Normocephalic.  Cardiovascular: Normal rate and regular rhythm.  Respiratory: Effort normal, non-labored breathing GI: Soft.  No distension. There is no tenderness.  Skin: WDI  Neuro: Mental Status: Patient is awake, alert, oriented to person, place, month, year, and situation. Patient is able to give a clear and coherent history. No signs of aphasia or neglect Cranial Nerves: II: Visual Fields are full. Pupils are equal, round, and reactive to light.III,IV, VI: EOMI without ptosis or diploplia.  V: Facial sensation is symmetric to temperature VII: Facial movement is symmetric.  VIII: hearing is intact to voice X: Uvula elevates symmetrically XI: Shoulder shrug is symmetric. XII: tongue is midline without atrophy or fasciculations.  Motor: Tone is normal. Bulk is normal. 5/5 strength was present in all four extremities. No wasting in the thenar or hyperthenar prominences. Sensory: Sensation is symmetric to light touch and temperature in the arms and legs. Tinel's sign was negative Deep Tendon Reflexes: 3+ and symmetric  in the biceps, 2+ patellae.  Plantars: Toes are downgoing bilaterally.  Cerebellar: FNF and HKS are intact bilaterally      I have reviewed labs in epic and the results pertinent to this consultation are: Potassium was 2.4  I have reviewed the images obtained: CT angiogram of the head and neck was negative. CT scan was normal  ASSESSMENT AND PLAN   71 y.o. female with past medical history for hypertension, remote history of smoking presents with recurrent  symptoms of right hand numbness and tingling with the right leg numbness with no involvement of face. Given her stereotypical symptoms, they seem to be less likely TIAs.  She does have moderate vascular risk factors of hypertension remote smoking history and therefore still should be considered.  CT head was negative for any acute infarct in the CT angiogram head and neck was performed which was normal.  Therefore, even if this was a remote possibility of TIA, in the presence of normal  CTA and low ABCD 2 score of 3 she has low chance of stroke. Remaining workup can be done as outpatient.  However, more likely her symptoms are more likely to be complicated migraine versus spinal pathology. Patient had no weakness and Tinel signs was negative and so carpal tunnel is unlikely but still can be considered. Hypokalemia can cause paraesthesia,  but not unilateral symptoms.    Plan MRI Brain w/o contrast and MRI C spine as outpatient  EMG/NCS  Lipid profile and AIC ( most recent was 5.9)  Continue ASA 325 mg daily Follow up with outpatient neurologist in 1-2 weeks.

## 2017-02-28 NOTE — ED Notes (Signed)
ED Provider at bedside. 

## 2017-02-28 NOTE — ED Notes (Signed)
Patient transported to CT 

## 2017-03-17 DIAGNOSIS — I10 Essential (primary) hypertension: Secondary | ICD-10-CM | POA: Diagnosis not present

## 2017-03-17 DIAGNOSIS — E876 Hypokalemia: Secondary | ICD-10-CM | POA: Diagnosis not present

## 2017-03-17 DIAGNOSIS — M25561 Pain in right knee: Secondary | ICD-10-CM | POA: Diagnosis not present

## 2017-03-18 ENCOUNTER — Other Ambulatory Visit: Payer: Self-pay | Admitting: Internal Medicine

## 2017-03-18 ENCOUNTER — Ambulatory Visit
Admission: RE | Admit: 2017-03-18 | Discharge: 2017-03-18 | Disposition: A | Discharge status: Home / Self Care | Payer: Medicare Other | Source: Ambulatory Visit | Attending: Internal Medicine | Admitting: Internal Medicine

## 2017-03-18 DIAGNOSIS — R52 Pain, unspecified: Secondary | ICD-10-CM

## 2017-04-04 DIAGNOSIS — M1711 Unilateral primary osteoarthritis, right knee: Secondary | ICD-10-CM | POA: Diagnosis not present

## 2017-04-04 DIAGNOSIS — G609 Hereditary and idiopathic neuropathy, unspecified: Secondary | ICD-10-CM | POA: Diagnosis not present

## 2017-04-04 DIAGNOSIS — I10 Essential (primary) hypertension: Secondary | ICD-10-CM | POA: Diagnosis not present

## 2017-04-04 DIAGNOSIS — K219 Gastro-esophageal reflux disease without esophagitis: Secondary | ICD-10-CM | POA: Diagnosis not present

## 2017-04-04 DIAGNOSIS — R7303 Prediabetes: Secondary | ICD-10-CM | POA: Diagnosis not present

## 2017-04-28 ENCOUNTER — Ambulatory Visit (INDEPENDENT_AMBULATORY_CARE_PROVIDER_SITE_OTHER): Payer: BC Managed Care – PPO | Admitting: Neurology

## 2017-04-28 ENCOUNTER — Encounter: Payer: Self-pay | Admitting: Neurology

## 2017-04-28 VITALS — BP 135/92 | HR 47 | Ht 66.0 in | Wt 176.0 lb

## 2017-04-28 DIAGNOSIS — R202 Paresthesia of skin: Secondary | ICD-10-CM | POA: Diagnosis not present

## 2017-04-28 NOTE — Patient Instructions (Signed)
Carpal Tunnel Syndrome Carpal tunnel syndrome is a condition that causes pain in your hand and arm. The carpal tunnel is a narrow area that is on the palm side of your wrist. Repeated wrist motion or certain diseases may cause swelling in the tunnel. This swelling can pinch the main nerve in the wrist (median nerve). Follow these instructions at home: If you have a splint:  Wear it as told by your doctor. Remove it only as told by your doctor.  Loosen the splint if your fingers: ? Become numb and tingle. ? Turn blue and cold.  Keep the splint clean and dry. General instructions  Take over-the-counter and prescription medicines only as told by your doctor.  Rest your wrist from any activity that may be causing your pain. If needed, talk to your employer about changes that can be made in your work, such as getting a wrist pad to use while typing.  If directed, apply ice to the painful area: ? Put ice in a plastic bag. ? Place a towel between your skin and the bag. ? Leave the ice on for 20 minutes, 2-3 times per day.  Keep all follow-up visits as told by your doctor. This is important.  Do any exercises as told by your doctor, physical therapist, or occupational therapist. Contact a doctor if:  You have new symptoms.  Medicine does not help your pain.  Your symptoms get worse. This information is not intended to replace advice given to you by your health care provider. Make sure you discuss any questions you have with your health care provider.      Electromyoneurogram Electromyoneurogram is a test to check how well your muscles and nerves are working. This procedure includes the combined use of electromyogram (EMG) and nerve conduction study (NCS). EMG is used to look for muscular disorders. NCS, which is also called electroneurogram, measures how well your nerves are controlling your muscles. The procedures are usually performed together to check if your muscles and nerves  are healthy. If the reaction to testing is abnormal, this can indicate disease or injury, such as peripheral nerve damage. Tell a health care provider about:  Any allergies you have.  All medicines you are taking, including vitamins, herbs, eye drops, creams, and over-the-counter medicines.  Any problems you or family members have had with anesthetic medicines.  Any blood disorders you have.  Any surgeries you have had.  Any medical conditions you have.  Any pacemaker you have. What are the risks? Generally, this is a safe procedure. However, problems may occur, including:  Infection where the electrodes were inserted.  Bleeding.  What happens before the procedure?  Ask your health care provider about: ? Changing or stopping your regular medicines. This is especially important if you are taking diabetes medicines or blood thinners. ? Taking medicines such as aspirin and ibuprofen. These medicines can thin your blood. Do not take these medicines before your procedure if your health care provider instructs you not to.  Your health care provider may ask you to avoid: ? Caffeine, such as coffee and tea. ? Nicotine. This includes cigarettes and anything with tobacco.  Do not use lotions or creams on the same day that you will be having the procedure. What happens during the procedure? For EMG:  Your health care provider will ask you to stay in a position so that he or she can access the muscle that will be studied. You may be standing, sitting down, or lying down.  You may be given a medicine that numbs the area (local anesthetic).  A very thin needle that has an electrode on it will be inserted into your muscle.  Another small electrode will be placed on your skin near the muscle.  Your health care provider will ask you to continue to remain still.  The electrodes will send a signal that tells about the electrical activity of your muscles. You may see this on a monitor or  hear it in the room.  After your muscles have been studied at rest, your health care provider will ask you to contract or flex your muscles. The electrodes will send a signal that tells about the electrical activity of your muscles.  Your health care provider will remove the electrodes and the electrode needles when the procedure is finished. The procedure may vary among health care providers and hospitals. For NCS:  An electrode that records your nerve activity (recording electrode) will be placed on your skin by the muscle that is being studied.  An electrode that is used as a reference (reference electrode) will be placed near the recording electrode.  A paste or gel will be applied to your skin between the recording electrode and the reference electrode.  Your nerve will be stimulated with a mild shock. Your health care provider will measure how much time it takes for your muscle to react.  Your health care provider will remove the electrodes and the gel when the procedure is finished. The procedure may vary among health care providers and hospitals. What happens after the procedure?  It is your responsibility to obtain your test results. Ask your health care provider or the department performing the test when and how you will get your results.  Your health care provider may: ? Give you medicines for any pain. ? Monitor the insertion sites to make sure that they stop bleeding. This information is not intended to replace advice given to you by your health care provider. Make sure you discuss any questions you have with your health care provider. Document Released: 06/28/2004 Document Revised: 08/03/2015 Document Reviewed: 04/18/2014 Elsevier Interactive Patient Education  2018 [place] Released: 02/14/2011 Document Revised: 08/03/2015 Document Reviewed: 07/13/2014 Elsevier Interactive Patient Education  2018 [employer].

## 2017-04-28 NOTE — Progress Notes (Signed)
[facility]    Provider:  Dr Jaynee Eagles Referring Provider: Audley Hose, MD Primary Care Physician:  Audley Hose, MD  CC:  Numbness, tingling  HPI:  Melinda Mckay is a 72 y.o. female here as a referral from Dr. Maia Petties. PMHx restless legs, prediabetes, hypertension, hyperlipidemia, asthma and anxiety.  She is here for numbness and tingling in the right arm and hand but has now experienced in the left hand.She says she is here for numbness and tingling in her fingers for 6 months. Her hand goes to sleep, radiates down to her elbow, the left hand is worsening now as well. Tingling wors ein themorning with use. She has worsening of tingling and numbnes and hands go to sleep and it is disturning but not painful. Rught hand is worse. Worse with driving. Better with resting. Happens daily.   Reviewed notes, labs and imaging from outside physicians, which showed:  HgbA1c 6.2, b12 545, tsh nml, ldl 182, cbc and cmp unremarkable (bun 14 and creat 0.72 03/17/2017)  Reviewed referring physician notes.  Patient with osteoarthritis and knee pain, history of rheumatoid arthritis as a young adult, however serology test for RA, ANA were negative but did have elevated sed rate and CRP.  She was using a walker for her right knee pain and swelling.  She had some neurologic symptoms in the previous month concerning for TIA she was seen in the ER at that time, workup was unrevealing except for severe hypokalemia that could have caused the numbness, she since followed up with her neurologist, she continues to have mild numbness in her fingertips, she has history of restless legs and has been on ropinirole.  Her restless leg symptoms have been resolved since she started wearing compression stockings.  She does not have varicose veins.  Reviewed CT head and agree with the following:  1. Empty sella. 2. Mild diffuse cerebral and cerebellar atrophy. 3. Mild chronic right ethmoid  sinusitis. 4. No acute abnormality.   Review of Systems: Patient complains of symptoms per HPI as well as the following symptoms: Joint pain, joint swelling, cramps, aching muscles, numbness, weakness, restless leg, mild anxiety history resolved over 10 weeks. Pertinent negatives and positives per HPI. All others negative.   Social History   Socioeconomic History  . Marital status: Legally Separated    Spouse name: Not on file  . Number of children: 2  . Years of education: Not on file  . Highest education level: Bachelor's degree (e.g., BA, AB, BS)  Social Needs  . Financial resource strain: Not on file  . Food insecurity - worry: Not on file  . Food insecurity - inability: Not on file  . Transportation needs - medical: Not on file  . Transportation needs - non-medical: Not on file  Occupational History  . Occupation: RETIRED   Tobacco Use  . Smoking status: Former Smoker    Packs/day: 0.40    Years: 20.00    Pack years: 8.00    Types: Cigarettes    Last attempt to quit: 03/11/1993    Years since quitting: 24.1  . Smokeless tobacco: Never Used  . Tobacco comment: PT STATES ABOUT 1 PACK A WEEK   Substance and Sexual Activity  . Alcohol use: No    Frequency: Never    Comment: quit 2015  . Drug use: No  . Sexual activity: Not on file  Other Topics Concern  . Not on file  Social History Narrative   Lives at  home alone   Right handed   Drinks 1 cup of coffee daily    Family History  Problem Relation Age of Onset  . Cancer Sister        KIDNEY   . Allergies Sister   . Asthma Sister   . Allergies Brother   . Diabetes Brother   . Hypertension Brother   . Asthma Brother     Past Medical History:  Diagnosis Date  . Anxiety   . Asthma   . Edema of lower extremity   . Hyperlipidemia   . Hypertension   . Prediabetes   . Restless legs     Past Surgical History:  Procedure Laterality Date  . bone spur removal Right   . BUNIONECTOMY Bilateral 2005  . PARTIAL  HYSTERECTOMY  2008  . TUBAL LIGATION  1982    Current Outpatient Medications  Medication Sig Dispense Refill  . acetaminophen (TYLENOL) 500 MG tablet Take 500 mg by mouth every 6 (six) hours as needed for mild pain.    [name] albuterol (PROAIR HFA) 108 (90 Base) MCG/ACT inhaler 2 puffs every 4 hours as needed only  if your can't catch your breath 1 Inhaler 1  . ALPRAZolam (XANAX) 1 MG tablet Take 1 mg by mouth 2 (two) times daily as needed for anxiety.    [name] amLODipine (NORVASC) 10 MG tablet Take 0.5 tablets by mouth daily.     [name] aspirin 81 MG chewable tablet Chew 81 mg by mouth daily.      . beclomethasone (QVAR REDIHALER) 80 MCG/ACT inhaler Inhale 2 puffs into the lungs daily. (Patient taking differently: Inhale 2 puffs into the lungs at bedtime. ) 1 Inhaler 11  . esomeprazole (NEXIUM) 40 MG capsule Take 40 mg by mouth daily as needed.    . hydrochlorothiazide (HYDRODIURIL) 25 MG tablet Take 25 mg by mouth daily.    . hydrOXYzine (ATARAX/VISTARIL) 25 MG tablet Take 25 mg by mouth at bedtime as needed.     [name] ibuprofen (ADVIL,MOTRIN) 400 MG tablet Take 400 mg by mouth every 6 (six) hours as needed.    . potassium chloride SA (K-DUR,KLOR-CON) 20 MEQ tablet Take 20 mEq by mouth daily.     No current facility-administered medications for this visit.     Allergies as of 04/28/2017  . (No Known Allergies)    Vitals: BP (!) 135/92 (BP Location: Right Arm, Patient Position: Sitting)   Pulse (!) 47   Ht 5\' 6"  (1.676 m)   Wt 176 lb (79.8 kg)   BMI 28.41 kg/m  Last Weight:  Wt Readings from Last 1 Encounters:  04/28/17 176 lb (79.8 kg)   Last Height:   Ht Readings from Last 1 Encounters:  04/28/17 5\' 6"  (1.676 m)   Physical exam: Exam: Gen: NAD, conversant, well nourised,  well groomed                     CV: RRR, no MRG. No Carotid Bruits. No peripheral edema, warm, nontender Eyes: Conjunctivae clear without exudates or hemorrhage  Neuro: Detailed Neurologic Exam  Speech:    Speech  is normal; fluent and spontaneous with normal comprehension.  Cognition:    The patient is oriented to person, place, and time;     recent and remote memory intact;     language fluent;     normal attention, concentration,     fund of knowledge Cranial Nerves:    The pupils are equal, round, and reactive  to light. Attempted fundoscopic exam could not visualize. Visual fields are full to finger confrontation. Extraocular movements are intact. Trigeminal sensation is intact and the muscles of mastication are normal. The face is symmetric. The palate elevates in the midline. Hearing intact. Voice is normal. Shoulder shrug is normal. The tongue has normal motion without fasciculations.   Coordination:    Normal finger to nose  Gait:    Normal native gait  Motor Observation:    No asymmetry, no atrophy, and no involuntary movements noted. Tone:    Normal muscle tone.    Posture:    Posture is normal. normal erect    Strength:    Strength is V/V in the upper and lower limbs.      Sensation: intact to LT     Reflex Exam:  DTR's:    Deep tendon reflexes in the upper and lower extremities are symmetrical bilaterally.   Toes:    The toes are equivocal bilaterally.   Clonus:    Clonus is absent.  + Phalen' maneuver bilateral wrists     Assessment/Plan:  Patient with numbness and tingling in her hands likely Carpal Tunnel Syndrome. Will order emg/ncs bilateral uppers. If negative we will consider further workup.  Orders Placed This Encounter  Procedures  . NCV with EMG(electromyography)     Sarina Ill, MD  [facility] [address] [place], [place] [address]  Phone [phone] Fax [phone]

## 2017-05-01 ENCOUNTER — Ambulatory Visit (INDEPENDENT_AMBULATORY_CARE_PROVIDER_SITE_OTHER): Payer: Medicare Other | Admitting: Neurology

## 2017-05-01 ENCOUNTER — Ambulatory Visit (INDEPENDENT_AMBULATORY_CARE_PROVIDER_SITE_OTHER): Payer: BC Managed Care – PPO | Admitting: Neurology

## 2017-05-01 DIAGNOSIS — Z0289 Encounter for other administrative examinations: Secondary | ICD-10-CM

## 2017-05-01 DIAGNOSIS — G5603 Carpal tunnel syndrome, bilateral upper limbs: Secondary | ICD-10-CM | POA: Diagnosis not present

## 2017-05-01 DIAGNOSIS — R202 Paresthesia of skin: Secondary | ICD-10-CM

## 2017-05-01 NOTE — Progress Notes (Signed)
See procedure note.

## 2017-05-01 NOTE — Progress Notes (Signed)
Full Name: Melinda Mckay Gender: Female MRN #: [id number] Date of Birth: [birth year]    Visit Date: 05/01/17 08:14 Age: 72 Years 96 Months Old Examining Physician: Sarina Ill, MD  Referring Physician: Audley Hose, MD  History: Evaluate for Carpal Tunnel Syndrome  Summary: Summary:   Nerve Conduction Studies were performed on the bilateral upper extremities.  The right Median orthodromic 2nd Digit sensory nerve showed reduced amplitude (8uV, N>10). The left Median orthodromic  2nd Digit sensory nerve showed reduced amplitude (6uV, N>10).  The right median/ulnar (palm) comparison nerve showed prolonged distal peak latency (Median Palm, 2.4 ms, N<2.2) and abnormal peak latency difference (Median Palm-Ulnar Palm, 0.6 ms, N<0.4) with a relative median delay.   The left median/ulnar (palm) comparison nerve showed prolonged distal peak latency (Median Palm, 2.3 ms, N<2.2) and abnormal peak latency difference (Median Palm-Ulnar Palm, 0.5 ms, N<0.4) with a relative median delay.   All remaining nerves (as detailed in the following table) were within normal limits. All muscles (as detailed in the following table) were within normal limits.   Conclusion:There is electrophysiologic evidence of bilateral mild Carpal Tunnel Syndrome. No suggestion of polyneuropathy or radiculopathy.    Discussed carpal tunnel syndrome with patient. Showed her images online and graphic representations. Provided literature. Discussed options for treatment including conservative measures, surgical options. Will refer to Orthopaedics.  A total of 15 minutes was spent in with this patient face-to-face. Over half this time was spent on counseling patient on the bilateral CTS diagnosis and different therapeutic options available. This does not include time spent performing EMG/NCS.    Sarina Ill M.D.  Cc: Audley Hose, MD  [facility] [place], [place]  [address] Tel: [phone] Fax: [phone]        [facility]    Nerve / Sites Muscle Latency Ref. Amplitude Ref. Rel Amp Segments Distance Velocity Ref. Area    ms ms mV mV %  cm m/s m/s mVms  L Median - APB     Wrist APB 3.4 ?4.4 10.3 ?4.0 100 Wrist - APB 7   39.5     Upper arm APB 7.4  10.1  98 Upper arm - Wrist 22 56 ?49 38.2  R Median - APB     Wrist APB 3.3 ?4.4 10.0 ?4.0 100 Wrist - APB 7   32.7     Upper arm APB 7.7  9.5  95.3 Upper arm - Wrist 22 50 ?49 31.6  L Ulnar - ADM     Wrist ADM 2.9 ?3.3 7.5 ?6.0 100 Wrist - ADM 7   28.1     B.Elbow ADM 6.5  6.9  91.2 B.Elbow - Wrist 21 59 ?49 26.6     A.Elbow ADM 8.6  6.3  91.1 A.Elbow - B.Elbow 12 55 ?49 25.1         A.Elbow - Wrist      R Ulnar - ADM     Wrist ADM 2.9 ?3.3 7.5 ?6.0 100 Wrist - ADM 7   27.6     B.Elbow ADM 6.7  7.2  96.7 B.Elbow - Wrist 21 55 ?49 28.6     A.Elbow ADM 8.8  6.9  95.9 A.Elbow - B.Elbow 12 58 ?49 26.6         A.Elbow - Wrist                 SNC    Nerve / Sites Rec. Site Peak Lat Ref.  Amp Ref. Segments Distance Peak Diff Ref.    ms ms V V  cm ms ms  L Median, Ulnar - Transcarpal comparison     Median Palm Wrist 2.3 ?2.2 23 ?35 Median Palm - Wrist 8       Ulnar Palm Wrist 1.8 ?2.2 22 ?12 Ulnar Palm - Wrist 8          Median Palm - Ulnar Palm  0.5 ?0.4  R Median, Ulnar - Transcarpal comparison     Median Palm Wrist 2.4 ?2.2 23 ?35 Median Palm - Wrist 8       Ulnar Palm Wrist 1.9 ?2.2 11 ?12 Ulnar Palm - Wrist 8          Median Palm - Ulnar Palm  0.6 ?0.4  L Median - Orthodromic (Dig II, Mid palm)     Dig II Wrist 3.1 ?3.4 8 ?10 Dig II - Wrist 13    R Median - Orthodromic (Dig II, Mid palm)     Dig II Wrist 3.2 ?3.4 6 ?10 Dig II - Wrist 13    L Ulnar - Orthodromic, (Dig V, Mid palm)     Dig V Wrist 2.4 ?3.1 8 ?5 Dig V - Wrist 37                 F  Wave    Nerve F Lat Ref.   ms ms  L Ulnar - ADM 30.6 ?32.0  R Ulnar - ADM 29.7 ?32.0         EMG full       EMG Summary Table    Spontaneous  MUAP Recruitment  Muscle IA Fib PSW Fasc Other Amp Dur. Poly Pattern  L. Deltoid Normal None None None _______ Normal Normal Normal Normal  R. Deltoid Normal None None None _______ Normal Normal Normal Normal  L. Triceps brachii Normal None None None _______ Normal Normal Normal Normal  R. Triceps brachii Normal None None None _______ Normal Normal Normal Normal  L. Pronator teres Normal None None None _______ Normal Normal Normal Normal  R. Pronator teres Normal None None None _______ Normal Normal Normal Normal  L. First dorsal interosseous Normal None None None _______ Normal Normal Normal Normal  R. First dorsal interosseous Normal None None None _______ Normal Normal Normal Normal  L. Opponens pollicis Normal None None None _______ Normal Normal Normal Normal  R. Opponens pollicis Normal None None None _______ Normal Normal Normal Normal  L. Cervical paraspinals (low) Normal None None None _______ Normal Normal Normal Normal  R. Cervical paraspinals (low) Normal None None None _______ Normal Normal Normal Normal

## 2017-05-05 NOTE — Addendum Note (Signed)
Addended by: Sarina Ill B on: 05/05/2017 08:18 AM   Modules accepted: Orders

## 2017-05-05 NOTE — Procedures (Signed)
Full Name: Melinda Mckay Gender: Female MRN #: [id number] Date of Birth: [birth year]    Visit Date: 05/01/17 08:14 Age: 72 Years 25 Months Old Examining Physician: Sarina Ill, MD  Referring Physician: Audley Hose, MD  History: Evaluate for Carpal Tunnel Syndrome  Summary: Summary:   Nerve Conduction Studies were performed on the bilateral upper extremities.  The right Median orthodromic 2nd Digit sensory nerve showed reduced amplitude (8uV, N>10). The left Median orthodromic  2nd Digit sensory nerve showed reduced amplitude (6uV, N>10).  The right median/ulnar (palm) comparison nerve showed prolonged distal peak latency (Median Palm, 2.4 ms, N<2.2) and abnormal peak latency difference (Median Palm-Ulnar Palm, 0.6 ms, N<0.4) with a relative median delay.   The left median/ulnar (palm) comparison nerve showed prolonged distal peak latency (Median Palm, 2.3 ms, N<2.2) and abnormal peak latency difference (Median Palm-Ulnar Palm, 0.5 ms, N<0.4) with a relative median delay.   All remaining nerves (as detailed in the following table) were within normal limits. All muscles (as detailed in the following table) were within normal limits.   Conclusion:There is electrophysiologic evidence of bilateral mild Carpal Tunnel Syndrome. No suggestion of polyneuropathy or radiculopathy.    Discussed carpal tunnel syndrome with patient. Showed her images online and graphic representations. Provided literature. Discussed options for treatment including conservative measures, surgical options. Will refer to Orthopaedics.  A total of 15 minutes was spent in with this patient face-to-face. Over half this time was spent on counseling patient on the bilateral CTS diagnosis and different therapeutic options available. This does not include time spent performing EMG/NCS.    Sarina Ill M.D.  Cc: Audley Hose, MD  [facility] [place], [place]  [address] Tel: [phone] Fax: [phone]        [facility]    Nerve / Sites Muscle Latency Ref. Amplitude Ref. Rel Amp Segments Distance Velocity Ref. Area    ms ms mV mV %  cm m/s m/s mVms  L Median - APB     Wrist APB 3.4 ?4.4 10.3 ?4.0 100 Wrist - APB 7   39.5     Upper arm APB 7.4  10.1  98 Upper arm - Wrist 22 56 ?49 38.2  R Median - APB     Wrist APB 3.3 ?4.4 10.0 ?4.0 100 Wrist - APB 7   32.7     Upper arm APB 7.7  9.5  95.3 Upper arm - Wrist 22 50 ?49 31.6  L Ulnar - ADM     Wrist ADM 2.9 ?3.3 7.5 ?6.0 100 Wrist - ADM 7   28.1     B.Elbow ADM 6.5  6.9  91.2 B.Elbow - Wrist 21 59 ?49 26.6     A.Elbow ADM 8.6  6.3  91.1 A.Elbow - B.Elbow 12 55 ?49 25.1         A.Elbow - Wrist      R Ulnar - ADM     Wrist ADM 2.9 ?3.3 7.5 ?6.0 100 Wrist - ADM 7   27.6     B.Elbow ADM 6.7  7.2  96.7 B.Elbow - Wrist 21 55 ?49 28.6     A.Elbow ADM 8.8  6.9  95.9 A.Elbow - B.Elbow 12 58 ?49 26.6         A.Elbow - Wrist                 SNC    Nerve / Sites Rec. Site Peak Lat Ref.  Amp Ref. Segments Distance Peak Diff Ref.    ms ms V V  cm ms ms  L Median, Ulnar - Transcarpal comparison     Median Palm Wrist 2.3 ?2.2 23 ?35 Median Palm - Wrist 8       Ulnar Palm Wrist 1.8 ?2.2 22 ?12 Ulnar Palm - Wrist 8          Median Palm - Ulnar Palm  0.5 ?0.4  R Median, Ulnar - Transcarpal comparison     Median Palm Wrist 2.4 ?2.2 23 ?35 Median Palm - Wrist 8       Ulnar Palm Wrist 1.9 ?2.2 11 ?12 Ulnar Palm - Wrist 8          Median Palm - Ulnar Palm  0.6 ?0.4  L Median - Orthodromic (Dig II, Mid palm)     Dig II Wrist 3.1 ?3.4 8 ?10 Dig II - Wrist 13    R Median - Orthodromic (Dig II, Mid palm)     Dig II Wrist 3.2 ?3.4 6 ?10 Dig II - Wrist 13    L Ulnar - Orthodromic, (Dig V, Mid palm)     Dig V Wrist 2.4 ?3.1 8 ?5 Dig V - Wrist 64                 F  Wave    Nerve F Lat Ref.   ms ms  L Ulnar - ADM 30.6 ?32.0  R Ulnar - ADM 29.7 ?32.0         EMG full       EMG Summary Table    Spontaneous  MUAP Recruitment  Muscle IA Fib PSW Fasc Other Amp Dur. Poly Pattern  L. Deltoid Normal None None None _______ Normal Normal Normal Normal  R. Deltoid Normal None None None _______ Normal Normal Normal Normal  L. Triceps brachii Normal None None None _______ Normal Normal Normal Normal  R. Triceps brachii Normal None None None _______ Normal Normal Normal Normal  L. Pronator teres Normal None None None _______ Normal Normal Normal Normal  R. Pronator teres Normal None None None _______ Normal Normal Normal Normal  L. First dorsal interosseous Normal None None None _______ Normal Normal Normal Normal  R. First dorsal interosseous Normal None None None _______ Normal Normal Normal Normal  L. Opponens pollicis Normal None None None _______ Normal Normal Normal Normal  R. Opponens pollicis Normal None None None _______ Normal Normal Normal Normal  L. Cervical paraspinals (low) Normal None None None _______ Normal Normal Normal Normal  R. Cervical paraspinals (low) Normal None None None _______ Normal Normal Normal Normal

## 2017-06-02 ENCOUNTER — Encounter: Payer: Self-pay | Admitting: Gastroenterology

## 2017-07-10 DIAGNOSIS — F419 Anxiety disorder, unspecified: Secondary | ICD-10-CM | POA: Diagnosis not present

## 2017-07-10 DIAGNOSIS — R600 Localized edema: Secondary | ICD-10-CM | POA: Diagnosis not present

## 2017-07-10 DIAGNOSIS — I10 Essential (primary) hypertension: Secondary | ICD-10-CM | POA: Diagnosis not present

## 2017-07-10 DIAGNOSIS — R7303 Prediabetes: Secondary | ICD-10-CM | POA: Diagnosis not present

## 2017-07-10 DIAGNOSIS — E785 Hyperlipidemia, unspecified: Secondary | ICD-10-CM | POA: Diagnosis not present

## 2017-09-10 ENCOUNTER — Ambulatory Visit: Payer: Medicare Other | Admitting: Internal Medicine

## 2017-10-13 ENCOUNTER — Other Ambulatory Visit: Payer: Self-pay | Admitting: Internal Medicine

## 2017-10-15 ENCOUNTER — Ambulatory Visit (INDEPENDENT_AMBULATORY_CARE_PROVIDER_SITE_OTHER): Payer: BC Managed Care – PPO | Admitting: Internal Medicine

## 2017-10-15 ENCOUNTER — Encounter: Payer: Self-pay | Admitting: Internal Medicine

## 2017-10-15 VITALS — BP 118/74 | HR 60 | Ht 65.0 in | Wt 176.8 lb

## 2017-10-15 DIAGNOSIS — J4530 Mild persistent asthma, uncomplicated: Secondary | ICD-10-CM | POA: Diagnosis not present

## 2017-10-15 LAB — NITRIC OXIDE: Nitric Oxide: 24

## 2017-10-15 MED ORDER — BECLOMETHASONE DIPROP HFA 80 MCG/ACT IN AERB: 2.0000 | INHALATION_SPRAY | Freq: Every day | RESPIRATORY_TRACT | 11 refills | Status: DC

## 2017-10-15 NOTE — Progress Notes (Signed)
Subjective:     Patient ID: Melinda Mckay, female   DOB: 1945-12-12,    MRN: [id number]    Brief patient profile:  51 yobf quit smoking 1995 with dx of asthma by Dr Melinda Mckay final ov  11/10/13 rec  maint asthmanex 220 2 each day/ prn saba  Primary is Dr Melinda Mckay    History of Present Illness  09/11/2015  1st   office visit/ Melinda Mckay  - transition of care Chief Complaint  Patient presents with  . Follow-up    Former Dr Melinda Mckay pt here for medication refill. Pt states that her breathing is overall doing well and denies any co's today. She very rarely uses albuterol.   very confused with meds, thinks qvar is a rescue med/ no problem from coughing on asmanex dpi  rec Plan A = Automatic = asthmanex or qvar 2 puffs daily  Plan B = Backup Only use your albuterol(Proair) as a rescue medication  GERD  Diet     09/10/2016  f/u ov/Melinda Mckay re:  Mild persistent asthma / qvar 80 2 pffs each pm  Chief Complaint  Patient presents with  . Follow-up    Breathing is doing well. No new co's. She very rarely uses her rescue inhaler.   Not limited by breathing from desired activities but very sedentary   rec Plan A = Automatic = Qvar 80  2 pffs daily  Work on inhaler technique:  Plan B = Backup Only use your albuterol (proair)  as a rescue medication      10/15/2017  f/u ov/Melinda Mckay re: mild persistent asthma / yearly eval did not bring meds but taking qvar 80 x2 pffs hs  Chief Complaint  Patient presents with  . Follow-up    Breathing is doing well. She denies any new co's and has not had to use her albuterol inhaler.   Dyspnea:  Not limited by breathing from desired activities   Cough: none Sleeping: fine flat SABA use: none 02: none     No obvious day to day or daytime variability or assoc excess/ purulent sputum or mucus plugs or hemoptysis or cp or chest tightness, subjective wheeze or overt sinus or hb symptoms.   Sleeping as above  without nocturnal  or early am exacerbation  of respiratory  c/o's  or need for noct saba. Also denies any obvious fluctuation of symptoms with weather or environmental changes or other aggravating or alleviating factors except as outlined above   No unusual exposure hx or h/o childhood pna/ asthma or knowledge of premature birth.  Current Allergies, Complete Past Medical History, Past Surgical History, Family History, and Social History were reviewed in [employer] record.  ROS  The following are not active complaints unless bolded Hoarseness, sore throat, dysphagia, dental problems, itching, sneezing,  nasal congestion or discharge of excess mucus or purulent secretions, ear ache,   fever, chills, sweats, unintended wt loss or wt gain, classically pleuritic or exertional cp,  orthopnea pnd or arm/hand swelling  or leg swelling, presyncope, palpitations, abdominal pain, anorexia, nausea, vomiting, diarrhea  or change in bowel habits or change in bladder habits, change in stools or change in urine, dysuria, hematuria,  rash, arthralgias, visual complaints, headache, numbness, weakness or ataxia or problems with walking or coordination,  change in mood or  memory.        Current Meds  Medication Sig  . acetaminophen (TYLENOL) 500 MG tablet Take 500 mg by mouth every 6 (six) hours as needed  for mild pain.  [name] albuterol (PROAIR HFA) 108 (90 Base) MCG/ACT inhaler 2 puffs every 4 hours as needed only  if your can't catch your breath  . ALPRAZolam (XANAX) 1 MG tablet Take 1 mg by mouth 2 (two) times daily as needed for anxiety.  [name] amLODipine (NORVASC) 10 MG tablet Take 0.5 tablets by mouth daily.   [name] aspirin 81 MG chewable tablet Chew 81 mg by mouth daily.    . beclomethasone (QVAR REDIHALER) 80 MCG/ACT inhaler Inhale 2 puffs into the lungs daily. (Patient taking differently: Inhale 2 puffs into the lungs at bedtime. )  . esomeprazole (NEXIUM) 40 MG capsule Take 40 mg by mouth daily as needed.  . hydrochlorothiazide (HYDRODIURIL) 25 MG tablet Take  25 mg by mouth daily.  . hydrOXYzine (ATARAX/VISTARIL) 25 MG tablet Take 25 mg by mouth at bedtime as needed.   [name] ibuprofen (ADVIL,MOTRIN) 400 MG tablet Take 400 mg by mouth every 6 (six) hours as needed.  . potassium chloride SA (K-DUR,KLOR-CON) 20 MEQ tablet Take 20 mEq by mouth daily.                     Objective:   Physical Exam  Pleasant amb bf nad  10/15/2017         176  09/10/2016         175   09/11/15 178 lb (80.74 kg)  11/10/13 184 lb (83.462 kg)  11/11/12 197 lb (89.359 kg)      Vital signs reviewed - Note on arrival 02 sats  97% on RA      HEENT: nl dentition, turbinates bilaterally, and oropharynx. Nl external ear canals without cough reflex   NECK :  without JVD/Nodes/TM/ nl carotid upstrokes bilaterally   LUNGS: no acc muscle use,  Nl contour chest which is clear to A and P bilaterally without cough on insp or exp maneuvers   CV:  RRR  no s3 or murmur or increase in P2, and no edema   ABD:  soft and nontender with nl inspiratory excursion in the supine position. No bruits or organomegaly appreciated, bowel sounds nl  MS:  Nl gait/ ext warm without deformities, calf tenderness, cyanosis or clubbing No obvious joint restrictions   SKIN: warm and dry without lesions    NEURO:  alert, approp, nl sensorium with  no motor or cerebellar deficits apparent.       Assessment:

## 2017-10-15 NOTE — Patient Instructions (Signed)
Plan A = Automatic = Qvar 80 2pffs daily    Plan B = Backup Only use your albuterol as a rescue medication to be used if you can't catch your breath by resting or doing a relaxed purse lip breathing pattern.  - The less you use it, the better it will work when you need it. - Ok to use the inhaler up to 2 puffs  every 4 hours if you must but call for appointment if use goes up over your usual need - Don't leave home without it !!  (think of it like the spare tire for your car)     Please schedule a follow up visit in 12 months but call sooner if needed

## 2017-10-16 ENCOUNTER — Telehealth: Payer: Self-pay | Admitting: Internal Medicine

## 2017-10-16 ENCOUNTER — Encounter: Payer: Self-pay | Admitting: Internal Medicine

## 2017-10-16 DIAGNOSIS — E785 Hyperlipidemia, unspecified: Secondary | ICD-10-CM | POA: Diagnosis not present

## 2017-10-16 DIAGNOSIS — G2581 Restless legs syndrome: Secondary | ICD-10-CM | POA: Diagnosis not present

## 2017-10-16 DIAGNOSIS — R600 Localized edema: Secondary | ICD-10-CM | POA: Diagnosis not present

## 2017-10-16 DIAGNOSIS — R7303 Prediabetes: Secondary | ICD-10-CM | POA: Diagnosis not present

## 2017-10-16 DIAGNOSIS — I10 Essential (primary) hypertension: Secondary | ICD-10-CM | POA: Diagnosis not present

## 2017-10-16 DIAGNOSIS — K219 Gastro-esophageal reflux disease without esophagitis: Secondary | ICD-10-CM | POA: Diagnosis not present

## 2017-10-16 MED ORDER — ALBUTEROL SULFATE HFA 108 (90 BASE) MCG/ACT IN AERS: INHALATION_SPRAY | RESPIRATORY_TRACT | 11 refills | Status: DC

## 2017-10-16 NOTE — Telephone Encounter (Signed)
Left detailed message on mobile number (dpr on file authorizing to leave detailed msg on that number) informing patient will rx her rescue inhaler to [facility]  on [facility] with additional refills.  Asked pt to please call the office back if anything further is needed.  Will sign off.  Last office visit [phone] with MW: Patient Instructions  Plan A = Automatic = Qvar 80 2pffs daily      Plan B = Backup Only use your albuterol as a rescue medication to be used if you can't catch your breath by resting or doing a relaxed purse lip breathing pattern.  - The less you use it, the better it will work when you need it. - Ok to use the inhaler up to 2 puffs  every 4 hours if you must but call for appointment if use goes up over your usual need - Don't leave home without it !!  (think of it like the spare tire for your car)      Please schedule a follow up visit in 12 months but call sooner if needed

## 2017-10-16 NOTE — Assessment & Plan Note (Signed)
PFT's 08/2011:  FEV1 1.81 (84%), ratio 69, 19% change with BD and return of ratio to normal, ++airtrapping. - 09/10/2016  After extensive coaching HFA effectiveness =   75% even with autohaler (short Ti) - FENO 10/15/2017  =   24      All goals of chronic asthma control met including optimal function and elimination of symptoms with minimal need for rescue therapy.  Contingencies discussed in full including contacting this office immediately if not controlling the symptoms using the rule of two's.     Each maintenance medication was reviewed in detail including most importantly the difference between maintenance and as needed and under what circumstances the prns are to be used.  Please see AVS for specific  Instructions which are unique to this visit and I personally typed out  which were reviewed in detail in writing with the patient and a copy provided.    F/u yearly is approp in this setting / call sooner prn

## 2017-11-07 DIAGNOSIS — D126 Benign neoplasm of colon, unspecified: Secondary | ICD-10-CM | POA: Diagnosis not present

## 2018-01-16 DIAGNOSIS — R7303 Prediabetes: Secondary | ICD-10-CM | POA: Diagnosis not present

## 2018-01-16 DIAGNOSIS — E785 Hyperlipidemia, unspecified: Secondary | ICD-10-CM | POA: Diagnosis not present

## 2018-01-16 DIAGNOSIS — I10 Essential (primary) hypertension: Secondary | ICD-10-CM | POA: Diagnosis not present

## 2018-01-16 DIAGNOSIS — K5730 Diverticulosis of large intestine without perforation or abscess without bleeding: Secondary | ICD-10-CM | POA: Insufficient documentation

## 2018-01-16 DIAGNOSIS — G2581 Restless legs syndrome: Secondary | ICD-10-CM | POA: Diagnosis not present

## 2018-01-16 DIAGNOSIS — F419 Anxiety disorder, unspecified: Secondary | ICD-10-CM | POA: Diagnosis not present

## 2018-01-16 DIAGNOSIS — Z23 Encounter for immunization: Secondary | ICD-10-CM | POA: Diagnosis not present

## 2018-01-16 DIAGNOSIS — K648 Other hemorrhoids: Secondary | ICD-10-CM | POA: Insufficient documentation

## 2018-01-16 DIAGNOSIS — D369 Benign neoplasm, unspecified site: Secondary | ICD-10-CM | POA: Insufficient documentation

## 2018-03-05 ENCOUNTER — Other Ambulatory Visit: Payer: Self-pay | Admitting: Podiatry

## 2018-03-05 ENCOUNTER — Encounter: Payer: Self-pay | Admitting: Podiatry

## 2018-03-05 ENCOUNTER — Ambulatory Visit (INDEPENDENT_AMBULATORY_CARE_PROVIDER_SITE_OTHER): Payer: BC Managed Care – PPO

## 2018-03-05 ENCOUNTER — Ambulatory Visit: Payer: Medicare Other

## 2018-03-05 ENCOUNTER — Ambulatory Visit (INDEPENDENT_AMBULATORY_CARE_PROVIDER_SITE_OTHER): Payer: Medicare Other | Admitting: Podiatry

## 2018-03-05 VITALS — BP 132/87 | HR 58 | Resp 16

## 2018-03-05 DIAGNOSIS — M7751 Other enthesopathy of right foot: Secondary | ICD-10-CM

## 2018-03-05 DIAGNOSIS — M21961 Unspecified acquired deformity of right lower leg: Secondary | ICD-10-CM

## 2018-03-05 DIAGNOSIS — M79671 Pain in right foot: Secondary | ICD-10-CM

## 2018-03-05 DIAGNOSIS — M2041 Other hammer toe(s) (acquired), right foot: Secondary | ICD-10-CM

## 2018-03-05 NOTE — Patient Instructions (Signed)
Pre-Operative Instructions  Congratulations, you have decided to take an important step towards improving your quality of life.  You can be assured that the doctors and staff at [facility] will be with you every step of the way.  Here are some important things you should know:  1. Plan to be at the surgery center/hospital at least 1 (one) hour prior to your scheduled time, unless otherwise directed by the surgical center/hospital staff.  You must have a responsible adult accompany you, remain during the surgery and drive you home.  Make sure you have directions to the surgical center/hospital to ensure you arrive on time. 2. If you are having surgery at [facility] or [facility] hospitals, you will need a copy of your medical history and physical form from your family physician within one month prior to the date of surgery. We will give you a form for your primary physician to complete.  3. We make every effort to accommodate the date you request for surgery.  However, there are times where surgery dates or times have to be moved.  We will contact you as soon as possible if a change in schedule is required.   4. No aspirin/ibuprofen for one week before surgery.  If you are on aspirin, any non-steroidal anti-inflammatory medications (Mobic, Aleve, Ibuprofen) should not be taken seven (7) days prior to your surgery.  You make take Tylenol for pain prior to surgery.  5. Medications - If you are taking daily heart and blood pressure medications, seizure, reflux, allergy, asthma, anxiety, pain or diabetes medications, make sure you notify the surgery center/hospital before the day of surgery so they can tell you which medications you should take or avoid the day of surgery. 6. No food or drink after midnight the night before surgery unless directed otherwise by surgical center/hospital staff. 7. No alcoholic beverages 24-hours prior to surgery.  No smoking 24-hours prior or 24-hours after  surgery. 8. Wear loose pants or shorts. They should be loose enough to fit over bandages, boots, and casts. 9. Don't wear slip-on shoes. Sneakers are preferred. 10. Bring your boot with you to the surgery center/hospital.  Also bring crutches or a walker if your physician has prescribed it for you.  If you do not have this equipment, it will be provided for you after surgery. 11. If you have not been contacted by the surgery center/hospital by the day before your surgery, call to confirm the date and time of your surgery. 12. Leave-time from work may vary depending on the type of surgery you have.  Appropriate arrangements should be made prior to surgery with your employer. 13. Prescriptions will be provided immediately following surgery by your doctor.  Fill these as soon as possible after surgery and take the medication as directed. Pain medications will not be refilled on weekends and must be approved by the doctor. 14. Remove nail polish on the operative foot and avoid getting pedicures prior to surgery. 15. Wash the night before surgery.  The night before surgery wash the foot and leg well with water and the antibacterial soap provided. Be sure to pay special attention to beneath the toenails and in between the toes.  Wash for at least three (3) minutes. Rinse thoroughly with water and dry well with a towel.  Perform this wash unless told not to do so by your physician.  Enclosed: 1 Ice pack (please put in freezer the night before surgery)   1 Hibiclens skin cleaner     Pre-op instructions  If you have any questions regarding the instructions, please do not hesitate to call our office.  [place]: [address], [place], [place] [address] -- [phone]  [place]: [address] , [place] [address] -- [phone]  [place]: [address]  [place], [place] [address] -- [phone]  [place]: [address], [address], [place], [place] [address] -- [phone]  Website:  [email or web address] 

## 2018-03-16 ENCOUNTER — Telehealth: Payer: Self-pay | Admitting: *Deleted

## 2018-03-16 NOTE — Telephone Encounter (Signed)
"  Dr. March Rummage had advised that you would be in contact with me about a foot surgery procedure.  I don't know if I missed the communication or missed an email.  If you could contact me a [phone].  I attempted to return her call.  I left her messages to call me back tomorrow.

## 2018-03-25 NOTE — Telephone Encounter (Signed)
I am returning your call.  You want to schedule your surgery.  "Yes, I guess I will go ahead and do that.  When can he do it?"  He can do it any Wednesday in February.  "Let's do it on February 12."  Okay I will get it scheduled.  Someone from the surgical center will give you a call a day or two prior to your surgery date and they will give you your arrival time.  You need to go online and register with the surgery center, instructions are in the brochure that we gave you.  "I will do it in the next couple of days."

## 2018-03-27 DIAGNOSIS — Z23 Encounter for immunization: Secondary | ICD-10-CM | POA: Diagnosis not present

## 2018-03-27 DIAGNOSIS — Z1239 Encounter for other screening for malignant neoplasm of breast: Secondary | ICD-10-CM | POA: Diagnosis not present

## 2018-03-27 DIAGNOSIS — N3941 Urge incontinence: Secondary | ICD-10-CM | POA: Diagnosis not present

## 2018-03-27 DIAGNOSIS — R7303 Prediabetes: Secondary | ICD-10-CM | POA: Diagnosis not present

## 2018-03-27 DIAGNOSIS — Z01419 Encounter for gynecological examination (general) (routine) without abnormal findings: Secondary | ICD-10-CM | POA: Diagnosis not present

## 2018-03-27 DIAGNOSIS — E876 Hypokalemia: Secondary | ICD-10-CM | POA: Diagnosis not present

## 2018-03-27 DIAGNOSIS — E785 Hyperlipidemia, unspecified: Secondary | ICD-10-CM | POA: Diagnosis not present

## 2018-03-27 DIAGNOSIS — Z1211 Encounter for screening for malignant neoplasm of colon: Secondary | ICD-10-CM | POA: Diagnosis not present

## 2018-03-27 DIAGNOSIS — Z0001 Encounter for general adult medical examination with abnormal findings: Secondary | ICD-10-CM | POA: Diagnosis not present

## 2018-03-27 DIAGNOSIS — Z13820 Encounter for screening for osteoporosis: Secondary | ICD-10-CM | POA: Diagnosis not present

## 2018-03-27 DIAGNOSIS — I10 Essential (primary) hypertension: Secondary | ICD-10-CM | POA: Diagnosis not present

## 2018-03-29 NOTE — Progress Notes (Signed)
Subjective:  Patient ID: Melinda Mckay, female    DOB: [birth year],  MRN: [id number]  Chief Complaint  Patient presents with  . Callouses    Right foot; 5th toe-medial side & 3rd toe-base of toe; pt stated, "I had bone spur surgery done 10 years ago with Dr. Marily Memos on my Right foot 5th toe; x1 yr  . Painful lesion    Bilateral; plantar forefoot-submet 2; xyrs    73 y.o. female presents with the above complaint. Hx as above.\   Review of Systems: Negative except as noted in the HPI. Denies N/V/F/Ch.  Past Medical History:  Diagnosis Date  . Anxiety   . Asthma   . Edema of lower extremity   . Hyperlipidemia   . Hypertension   . Prediabetes   . Restless legs     Current Outpatient Medications:  .  acetaminophen (TYLENOL) 500 MG tablet, Take 500 mg by mouth every 6 (six) hours as needed for mild pain., Disp: , Rfl:  .  albuterol (PROAIR HFA) 108 (90 Base) MCG/ACT inhaler, 2 puffs every 4 hours as needed only  if your can't catch your breath, Disp: 1 Inhaler, Rfl: 11 .  ALPRAZolam (XANAX) 1 MG tablet, Take 1 mg by mouth 2 (two) times daily as needed for anxiety., Disp: , Rfl:  .  amLODipine (NORVASC) 10 MG tablet, Take 0.5 tablets by mouth daily. , Disp: , Rfl:  .  aspirin 81 MG chewable tablet, Chew 81 mg by mouth daily.  , Disp: , Rfl:  .  beclomethasone (QVAR REDIHALER) 80 MCG/ACT inhaler, Inhale 2 puffs into the lungs daily., Disp: 1 Inhaler, Rfl: 11 .  esomeprazole (NEXIUM) 40 MG capsule, Take 40 mg by mouth daily as needed., Disp: , Rfl:  .  hydrochlorothiazide (HYDRODIURIL) 25 MG tablet, Take 25 mg by mouth daily., Disp: , Rfl:  .  hydrOXYzine (ATARAX/VISTARIL) 25 MG tablet, Take 25 mg by mouth at bedtime as needed. , Disp: , Rfl:  .  losartan (COZAAR) 50 MG tablet, losartan 50 mg tablet  Take 1 tablet every day by oral route for 30 days., Disp: , Rfl:  .  potassium chloride SA (K-DUR,KLOR-CON) 20 MEQ tablet, Take 20 mEq by mouth daily., Disp: , Rfl:  .   rosuvastatin (CRESTOR) 20 MG tablet, rosuvastatin 20 mg tablet  takes 10mg  daily, Disp: , Rfl:   Social History   Tobacco Use  Smoking Status Former Smoker  . Packs/day: 0.40  . Years: 20.00  . Pack years: 8.00  . Types: Cigarettes  . Quit date: 03/11/1993  . Years since quitting: 25.5  Smokeless Tobacco Never Used  Tobacco Comment   PT STATES ABOUT 1 PACK A WEEK     No Known Allergies Objective:   Vitals:   03/05/18 0955  BP: 132/87  Pulse: (!) 58  Resp: 16   There is no height or weight on file to calculate BMI. Constitutional Well developed. Well nourished.  Vascular Dorsalis pedis pulses palpable bilaterally. Posterior tibial pulses palpable bilaterally. Capillary refill normal to all digits.  No cyanosis or clubbing noted. Pedal hair growth normal.  Neurologic Normal speech. Oriented to person, place, and time. Epicritic sensation to light touch grossly present bilaterally.  Dermatologic Nails well groomed and normal in appearance. No open wounds. No skin lesions.  Orthopedic: Normal joint ROM without pain or crepitus bilaterally. Palpation about the right second MPJ with hammertoes third toe   Radiographs: Taken and reviewed elongated second metatarsal, lesser digital contractures  Assessment:   1. Metatarsal deformity, right   2. Hammertoe of right foot    Plan:  Patient was evaluated and treated and all questions answered.  Metatarsal deformity, hammertoe right foot -Discussed continued conservative or surgical care of the above deformities. -Patient has failed all conservative therapy and wishes to proceed with surgical intervention. All risks, benefits, and alternatives discussed with patient. No guarantees given. Consent reviewed and signed by patient. -Planned procedures: Right second metatarsal shortening osteotomy, correction of right third hammertoe correction of right fifth hammertoe with syndactylization of the skin  No follow-ups on file.

## 2018-03-30 ENCOUNTER — Other Ambulatory Visit: Payer: Self-pay | Admitting: Internal Medicine

## 2018-03-30 DIAGNOSIS — Z1231 Encounter for screening mammogram for malignant neoplasm of breast: Secondary | ICD-10-CM

## 2018-03-30 DIAGNOSIS — Z13820 Encounter for screening for osteoporosis: Secondary | ICD-10-CM

## 2018-04-03 ENCOUNTER — Telehealth: Payer: Self-pay | Admitting: *Deleted

## 2018-04-03 NOTE — Telephone Encounter (Signed)
"  I have scheduled a procedure for Dr. March Rummage on Wednesday, February 12.  I'm going to have to delay it.  I'm going to be closing on a house and moving.  So, I may call you back and schedule it in March.  Call if you have any questions."

## 2018-04-07 NOTE — Telephone Encounter (Signed)
Noted thanks °

## 2018-04-07 NOTE — Telephone Encounter (Signed)
I left her a message that I got her message.  I will cancel the surgery.  I asked her to give [place] a call when she's ready to reschedule.  I wished her well on her move.

## 2018-04-24 ENCOUNTER — Ambulatory Visit
Admission: RE | Admit: 2018-04-24 | Discharge: 2018-04-24 | Disposition: A | Discharge status: Home / Self Care | Payer: Medicare Other | Source: Ambulatory Visit | Attending: Internal Medicine | Admitting: Internal Medicine

## 2018-04-24 DIAGNOSIS — Z13820 Encounter for screening for osteoporosis: Secondary | ICD-10-CM

## 2018-04-24 DIAGNOSIS — Z1231 Encounter for screening mammogram for malignant neoplasm of breast: Secondary | ICD-10-CM

## 2018-06-19 DIAGNOSIS — E785 Hyperlipidemia, unspecified: Secondary | ICD-10-CM | POA: Diagnosis not present

## 2018-06-19 DIAGNOSIS — R7303 Prediabetes: Secondary | ICD-10-CM | POA: Diagnosis not present

## 2018-06-19 DIAGNOSIS — I10 Essential (primary) hypertension: Secondary | ICD-10-CM | POA: Diagnosis not present

## 2018-06-19 DIAGNOSIS — G2581 Restless legs syndrome: Secondary | ICD-10-CM | POA: Diagnosis not present

## 2018-09-07 DIAGNOSIS — R7303 Prediabetes: Secondary | ICD-10-CM | POA: Diagnosis not present

## 2018-09-07 DIAGNOSIS — E785 Hyperlipidemia, unspecified: Secondary | ICD-10-CM | POA: Diagnosis not present

## 2018-09-07 DIAGNOSIS — R600 Localized edema: Secondary | ICD-10-CM | POA: Diagnosis not present

## 2018-09-07 DIAGNOSIS — I10 Essential (primary) hypertension: Secondary | ICD-10-CM | POA: Diagnosis not present

## 2018-09-07 DIAGNOSIS — F419 Anxiety disorder, unspecified: Secondary | ICD-10-CM | POA: Diagnosis not present

## 2018-09-10 DIAGNOSIS — E785 Hyperlipidemia, unspecified: Secondary | ICD-10-CM | POA: Diagnosis not present

## 2018-09-10 DIAGNOSIS — R7303 Prediabetes: Secondary | ICD-10-CM | POA: Diagnosis not present

## 2018-09-10 DIAGNOSIS — I10 Essential (primary) hypertension: Secondary | ICD-10-CM | POA: Diagnosis not present

## 2018-10-13 ENCOUNTER — Ambulatory Visit: Payer: BC Managed Care – PPO | Admitting: Internal Medicine

## 2018-10-16 ENCOUNTER — Ambulatory Visit: Payer: BC Managed Care – PPO | Admitting: Internal Medicine

## 2018-12-02 DIAGNOSIS — E785 Hyperlipidemia, unspecified: Secondary | ICD-10-CM | POA: Diagnosis not present

## 2018-12-02 DIAGNOSIS — R635 Abnormal weight gain: Secondary | ICD-10-CM | POA: Diagnosis not present

## 2018-12-02 DIAGNOSIS — F419 Anxiety disorder, unspecified: Secondary | ICD-10-CM | POA: Diagnosis not present

## 2018-12-02 DIAGNOSIS — R7303 Prediabetes: Secondary | ICD-10-CM | POA: Diagnosis not present

## 2018-12-02 DIAGNOSIS — I10 Essential (primary) hypertension: Secondary | ICD-10-CM | POA: Diagnosis not present

## 2018-12-02 DIAGNOSIS — G2581 Restless legs syndrome: Secondary | ICD-10-CM | POA: Diagnosis not present

## 2018-12-10 ENCOUNTER — Telehealth: Payer: Self-pay | Admitting: Internal Medicine

## 2018-12-10 MED ORDER — QVAR REDIHALER 80 MCG/ACT IN AERB: 2.0000 | INHALATION_SPRAY | Freq: Every day | RESPIRATORY_TRACT | 0 refills | Status: DC

## 2018-12-10 MED ORDER — ALBUTEROL SULFATE HFA 108 (90 BASE) MCG/ACT IN AERS: INHALATION_SPRAY | RESPIRATORY_TRACT | 0 refills | Status: DC

## 2018-12-10 NOTE — Telephone Encounter (Signed)
I refilled her albuterol inhaler and her qvar and left her a detailed msg letting her know that this was done and to keep her televisit for 12/15/2018

## 2018-12-15 ENCOUNTER — Ambulatory Visit (INDEPENDENT_AMBULATORY_CARE_PROVIDER_SITE_OTHER): Payer: BC Managed Care – PPO | Admitting: Internal Medicine

## 2018-12-15 ENCOUNTER — Telehealth: Payer: Self-pay | Admitting: Internal Medicine

## 2018-12-15 ENCOUNTER — Other Ambulatory Visit: Payer: Self-pay

## 2018-12-15 ENCOUNTER — Encounter: Payer: Self-pay | Admitting: Internal Medicine

## 2018-12-15 DIAGNOSIS — J4530 Mild persistent asthma, uncomplicated: Secondary | ICD-10-CM | POA: Diagnosis not present

## 2018-12-15 MED ORDER — QVAR REDIHALER 80 MCG/ACT IN AERB: 2.0000 | INHALATION_SPRAY | Freq: Every day | RESPIRATORY_TRACT | 5 refills | Status: DC

## 2018-12-15 NOTE — Progress Notes (Signed)
Subjective:     Patient ID: Melinda Mckay, female   DOB: Sep 01, 1945,    MRN: [id number]    Brief patient profile:  40 yobf quit smoking 1995 with dx of asthma by Dr Gwenette Greet final ov  11/10/13 rec  maint asthmanex 220 2 each day/ prn saba  Primary is Dr Maia Petties    History of Present Illness  09/11/2015  1st   office visit/ Mirakle Tomlin  - transition of care Chief Complaint  Patient presents with  . Follow-up    Former Dr Gwenette Greet pt here for medication refill. Pt states that her breathing is overall doing well and denies any co's today. She very rarely uses albuterol.   very confused with meds, thinks qvar is a rescue med/ no problem from coughing on asmanex dpi  rec Plan A = Automatic = asthmanex or qvar 2 puffs daily  Plan B = Backup Only use your albuterol(Proair) as a rescue medication  GERD  Diet     09/10/2016  f/u ov/Auston Halfmann re:  Mild persistent asthma / qvar 80 2 pffs each pm  Chief Complaint  Patient presents with  . Follow-up    Breathing is doing well. No new co's. She very rarely uses her rescue inhaler.   Not limited by breathing from desired activities but very sedentary   rec Plan A = Automatic = Qvar 80  2 pffs daily  Work on inhaler technique:  Plan B = Backup Only use your albuterol (proair)  as a rescue medication      10/15/2017  f/u ov/Dacotah Cabello re: mild persistent asthma / yearly eval did not bring meds but taking qvar 80 x2 pffs hs  Chief Complaint  Patient presents with  . Follow-up    Breathing is doing well. She denies any new co's and has not had to use her albuterol inhaler.   Dyspnea:  Not limited by breathing from desired activities   Cough: none Sleeping: fine flat SABA use: none 02: none   rec Plan A = Automatic = Qvar 80 2pffs daily  Plan B = Backup Only use your albuterol as a rescue medication   Virtual Visit via Telephone Note 12/15/2018   I connected with Melinda Mckay on 12/15/18 at  8:45 AM EDT by telephone and verified that I am speaking  with the correct person using two identifiers.   I discussed the limitations, risks, security and privacy concerns of performing an evaluation and management service by telephone and the availability of in person appointments. I also discussed with the patient that there may be a patient responsible charge related to this service. The patient expressed understanding and agreed to proceed.   History of Present Illness: Dyspnea:  Walks 20-30 min most daily mostly flat  Cough: none Sleeping: able to sleep slt elevation on adjustable  SABA use: not using at all 02: none    No obvious day to day or daytime variability or assoc excess/ purulent sputum or mucus plugs or hemoptysis or cp or chest tightness, subjective wheeze or overt sinus or hb symptoms.    Also denies any obvious fluctuation of symptoms with weather or environmental changes or other aggravating or alleviating factors except as outlined above.   Meds reviewed/ med reconciliation completed          Observations/Objective: Sounds great on the phone, very upbeat - good phonation and no wob noted   Assessment and Plan: See problem list for active a/p's   Follow Up Instructions: See  avs for instructions unique to this ov which includes revised/ updated med list     I discussed the assessment and treatment plan with the patient. The patient was provided an opportunity to ask questions and all were answered. The patient agreed with the plan and demonstrated an understanding of the instructions.   The patient was advised to call back or seek an in-person evaluation if the symptoms worsen or if the condition fails to improve as anticipated.  I provided 25  minutes of non-face-to-face time during this encounter.   Christinia Gully, MD

## 2018-12-15 NOTE — Telephone Encounter (Signed)
Spoke with Drai, pharmacist and gave VO to change from proair to ventolin or proventil  Nothing further needed

## 2018-12-15 NOTE — Assessment & Plan Note (Signed)
Quit smoking 1995  PFT's 08/2011:  FEV1 1.81 (84%), ratio 69, 19% change with BD and return of ratio to normal, ++airtrapping. - 09/10/2016  After extensive coaching HFA effectiveness =   75% even with autohaler (short Ti) - FENO 10/15/2017  =   24      All goals of chronic asthma control met including optimal function and elimination of symptoms with minimal need for rescue therapy.  Contingencies discussed in full including contacting this office immediately if not controlling the symptoms using the rule of two's.      Each maintenance medication was reviewed in detail including most importantly the difference between maintenance and as needed and under what circumstances the prns are to be used.  Please see AVS for specific  Instructions which are unique to this visit and I personally typed out  which were reviewed in detail over the phone with the patient and a copy provided via My Chart

## 2018-12-15 NOTE — Patient Instructions (Signed)
Plan A = Automatic = Qvar 80 2pffs daily - increase to 2 puffs every 12 hours for any flare of respiratory symptoms     Plan B = Backup Only use your albuterol as a rescue medication to be used if you can't catch your breath by resting or doing a relaxed purse lip breathing pattern.  - The less you use it, the better it will work when you need it. - Ok to use the inhaler up to 2 puffs  every 4 hours if you must but call for appointment if use goes up over your usual need - Don't leave home without it !!  (think of it like the spare tire for your car)  - Remember to run water thru the empty device if it doesn't spray well as it is likely gummed up     Please schedule a follow up visit in 12 months but call sooner if needed   Pt has access to MyChart

## 2019-06-03 DIAGNOSIS — Z23 Encounter for immunization: Secondary | ICD-10-CM | POA: Diagnosis not present

## 2019-08-06 ENCOUNTER — Other Ambulatory Visit: Payer: Self-pay | Admitting: Internal Medicine

## 2019-08-06 DIAGNOSIS — Z1231 Encounter for screening mammogram for malignant neoplasm of breast: Secondary | ICD-10-CM

## 2019-08-10 ENCOUNTER — Ambulatory Visit
Admission: RE | Admit: 2019-08-10 | Discharge: 2019-08-10 | Disposition: A | Discharge status: Home / Self Care | Payer: Medicare Other | Source: Ambulatory Visit | Attending: Internal Medicine | Admitting: Internal Medicine

## 2019-08-10 ENCOUNTER — Other Ambulatory Visit: Payer: Self-pay

## 2019-08-10 DIAGNOSIS — Z1231 Encounter for screening mammogram for malignant neoplasm of breast: Secondary | ICD-10-CM

## 2019-12-18 ENCOUNTER — Other Ambulatory Visit: Payer: Self-pay | Admitting: Internal Medicine

## 2019-12-29 ENCOUNTER — Other Ambulatory Visit: Payer: Self-pay | Admitting: Internal Medicine

## 2020-01-17 ENCOUNTER — Ambulatory Visit (INDEPENDENT_AMBULATORY_CARE_PROVIDER_SITE_OTHER): Payer: BC Managed Care – PPO | Admitting: Internal Medicine

## 2020-01-17 ENCOUNTER — Encounter: Payer: Self-pay | Admitting: Internal Medicine

## 2020-01-17 ENCOUNTER — Other Ambulatory Visit: Payer: Self-pay

## 2020-01-17 DIAGNOSIS — J4530 Mild persistent asthma, uncomplicated: Secondary | ICD-10-CM | POA: Diagnosis not present

## 2020-01-17 MED ORDER — ALBUTEROL SULFATE HFA 108 (90 BASE) MCG/ACT IN AERS: INHALATION_SPRAY | RESPIRATORY_TRACT | 2 refills | Status: DC

## 2020-01-17 MED ORDER — QVAR REDIHALER 80 MCG/ACT IN AERB: 2.0000 | INHALATION_SPRAY | Freq: Every day | RESPIRATORY_TRACT | 11 refills | Status: DC

## 2020-01-17 NOTE — Progress Notes (Signed)
Subjective:     Patient ID: Melinda Mckay, female   DOB: 12-Jul-1945,    MRN: [id number]    Brief patient profile:  72 yobf quit smoking 1995 with dx of asthma by Dr Gwenette Greet final ov  11/10/13 rec  maint asthmanex 220 2 each day/ prn saba  Primary is Dr Maia Petties    History of Present Illness  09/11/2015  1st   office visit/ Melinda Mckay  - transition of care Chief Complaint  Patient presents with   Follow-up    Former Dr Gwenette Greet pt here for medication refill. Pt states that her breathing is overall doing well and denies any co's today. She very rarely uses albuterol.   very confused with meds, thinks qvar is a rescue med/ no problem from coughing on asmanex dpi  rec Plan A = Automatic = asthmanex or qvar 2 puffs daily  Plan B = Backup Only use your albuterol(Proair) as a rescue medication  GERD  Diet     09/10/2016  f/u ov/[name] re:  Mild persistent asthma / qvar 80 2 pffs each pm  Chief Complaint  Patient presents with   Follow-up    Breathing is doing well. No new co's. She very rarely uses her rescue inhaler.   Not limited by breathing from desired activities but very sedentary   rec Plan A = Automatic = Qvar 80  2 pffs daily  Work on inhaler technique:  Plan B = Backup Only use your albuterol (proair)  as a rescue medication    01/17/2020  f/u ov/[name] re: mild chronic asthma on qvar 80 2pffs /day never increased even with recent uri  Chief Complaint  Patient presents with   Follow-up    Breathing is doing well and she has not needed her albuterol.    Dyspnea:  Not limited by breathing from desired activities   Cough: none  Sleeping: flat is flat/ one pillow SABA use: none  02: none    No obvious day to day or daytime variability or assoc excess/ purulent sputum or mucus plugs or hemoptysis or cp or chest tightness, subjective wheeze or overt sinus or hb symptoms.   Sleeping  without nocturnal  or early am exacerbation  of respiratory  c/o's or need for noct saba. Also  denies any obvious fluctuation of symptoms with weather or environmental changes or other aggravating or alleviating factors except as outlined above   No unusual exposure hx or h/o childhood pna/ asthma or knowledge of premature birth.  Current Allergies, Complete Past Medical History, Past Surgical History, Family History, and Social History were reviewed in [employer] record.  ROS  The following are not active complaints unless bolded Hoarseness, sore throat, dysphagia, dental problems, itching, sneezing,  nasal congestion or discharge of excess mucus or purulent secretions, ear ache,   fever, chills, sweats, unintended wt loss or wt gain, classically pleuritic or exertional cp,  orthopnea pnd or arm/hand swelling  or leg swelling, presyncope, palpitations, abdominal pain, anorexia, nausea, vomiting, diarrhea  or change in bowel habits or change in bladder habits, change in stools or change in urine, dysuria, hematuria,  rash, arthralgias, visual complaints, headache, numbness, weakness or ataxia or problems with walking or coordination,  change in mood or  memory.        Current Meds  Medication Sig   acetaminophen (TYLENOL) 500 MG tablet Take 500 mg by mouth every 6 (six) hours as needed for mild pain.   albuterol (PROAIR HFA) 108 (  90 Base) MCG/ACT inhaler 2 puffs every 4 hours as needed only  if your can't catch your breath   ALPRAZolam (XANAX) 1 MG tablet Take 1 mg by mouth 2 (two) times daily as needed for anxiety.   amLODipine (NORVASC) 10 MG tablet Take 0.5 tablets by mouth daily.    aspirin 81 MG chewable tablet Chew 81 mg by mouth daily.     beclomethasone (QVAR REDIHALER) 80 MCG/ACT inhaler Inhale 2 puffs into the lungs daily.   esomeprazole (NEXIUM) 40 MG capsule Take 40 mg by mouth daily as needed.   hydrochlorothiazide (HYDRODIURIL) 25 MG tablet Take 25 mg by mouth daily.   hydrOXYzine (ATARAX/VISTARIL) 25 MG tablet Take 25 mg by mouth at bedtime  as needed.    losartan (COZAAR) 50 MG tablet losartan 50 mg tablet  Take 1 tablet every day by oral route for 30 days.   potassium chloride SA (K-DUR,KLOR-CON) 20 MEQ tablet Take 20 mEq by mouth daily.   rosuvastatin (CRESTOR) 20 MG tablet rosuvastatin 20 mg tablet  takes 10mg  daily                       Objective:   Physical Exam   pleasant bf nad   01/17/2020       182 10/15/2017         176  09/10/2016         175   09/11/15 178 lb (80.74 kg)  11/10/13 184 lb (83.462 kg)  11/11/12 197 lb (89.359 kg)     Vital signs reviewed  01/17/2020  - Note at rest 02 sats  98% on RA      HEENT : pt wearing mask not removed for exam due to covid -19 concerns.    NECK :  without JVD/Nodes/TM/ nl carotid upstrokes bilaterally   LUNGS: no acc muscle use,  Nl contour chest which is clear to A and P bilaterally without cough on insp or exp maneuvers   CV:  RRR  no s3 or murmur or increase in P2, and no edema   ABD:  soft and nontender with nl inspiratory excursion in the supine position. No bruits or organomegaly appreciated, bowel sounds nl  MS:  Nl gait/ ext warm without deformities, calf tenderness, cyanosis or clubbing No obvious joint restrictions   SKIN: warm and dry without lesions    NEURO:  alert, approp, nl sensorium with  no motor or cerebellar deficits apparent.              Assessment:

## 2020-01-17 NOTE — Patient Instructions (Addendum)
Plan A = Automatic = Qvar 80 2pffs daily - increase to 2 puffs every 12 hours for any flare of respiratory symptoms    Work on inhaler technique:  relax and gently blow all the way out then take a nice smooth deep breath back in, triggering the inhaler at same time you start breathing in.  Hold for up to 5 seconds if you can. Blow out thru nose. Rinse and gargle with water when done    Plan B = Backup Only use your albuterol as a rescue medication to be used if you can't catch your breath by resting or doing a relaxed purse lip breathing pattern.  - The less you use it, the better it will work when you need it. - Ok to use the inhaler up to 2 puffs  every 4 hours if you must but call for appointment if use goes up over your usual need - Don't leave home without it !!  (think of it like the spare tire for your car)  - Remember to run water thru the empty device if it doesn't spray well as it is likely gummed up     Please schedule a follow up visit in 12 months but call sooner if needed

## 2020-01-17 NOTE — Assessment & Plan Note (Addendum)
Quit smoking 1995  PFT's 08/2011:  FEV1 1.81 (84%), ratio 69, 19% change with BD and return of ratio to normal, ++airtrapping. - 09/10/2016  After extensive coaching HFA effectiveness =   75% even with autohaler (short Ti) - FENO 10/15/2017  =   24   - 01/17/2020  After extensive coaching inhaler device,  effectiveness =  75%  From a baseline 50%   All goals of chronic asthma control met including optimal function and elimination of symptoms with minimal need for rescue therapy.  Contingencies discussed in full including contacting this office immediately if not controlling the symptoms using the rule of two's.     Advised to immediately increase qvar to 2bid for any flare for at least a week to let the ICS take full effect before attempting wean    F/u in 12 months, call sooner if needed          Each maintenance medication was reviewed in detail including emphasizing most importantly the difference between maintenance and prns and under what circumstances the prns are to be triggered using an action plan format where appropriate.  Total time for H and P, chart review, counseling, teaching device and generating customized AVS unique to this office visit / charting = 23 min

## 2020-02-07 ENCOUNTER — Other Ambulatory Visit: Payer: Self-pay | Admitting: Internal Medicine

## 2020-02-07 DIAGNOSIS — M79604 Pain in right leg: Secondary | ICD-10-CM

## 2020-02-09 ENCOUNTER — Ambulatory Visit
Admission: RE | Admit: 2020-02-09 | Discharge: 2020-02-09 | Disposition: A | Discharge status: Home / Self Care | Payer: Medicare Other | Source: Ambulatory Visit | Attending: Internal Medicine | Admitting: Internal Medicine

## 2020-02-09 DIAGNOSIS — M79604 Pain in right leg: Secondary | ICD-10-CM

## 2020-04-23 ENCOUNTER — Emergency Department (HOSPITAL_BASED_OUTPATIENT_CLINIC_OR_DEPARTMENT_OTHER): Payer: BC Managed Care – PPO

## 2020-04-23 ENCOUNTER — Observation Stay (HOSPITAL_BASED_OUTPATIENT_CLINIC_OR_DEPARTMENT_OTHER)
Admission: EM | Admit: 2020-04-23 | Discharge: 2020-04-24 | Disposition: A | Discharge status: Home / Self Care | Payer: BC Managed Care – PPO | Attending: Internal Medicine | Admitting: Internal Medicine

## 2020-04-23 ENCOUNTER — Encounter (HOSPITAL_BASED_OUTPATIENT_CLINIC_OR_DEPARTMENT_OTHER): Payer: Self-pay | Admitting: Emergency Medicine

## 2020-04-23 ENCOUNTER — Other Ambulatory Visit: Payer: Self-pay

## 2020-04-23 DIAGNOSIS — Z79899 Other long term (current) drug therapy: Secondary | ICD-10-CM | POA: Diagnosis not present

## 2020-04-23 DIAGNOSIS — J45998 Other asthma: Secondary | ICD-10-CM | POA: Diagnosis not present

## 2020-04-23 DIAGNOSIS — G458 Other transient cerebral ischemic attacks and related syndromes: Secondary | ICD-10-CM | POA: Diagnosis not present

## 2020-04-23 DIAGNOSIS — I10 Essential (primary) hypertension: Secondary | ICD-10-CM | POA: Diagnosis present

## 2020-04-23 DIAGNOSIS — U071 COVID-19: Secondary | ICD-10-CM | POA: Diagnosis not present

## 2020-04-23 DIAGNOSIS — E785 Hyperlipidemia, unspecified: Secondary | ICD-10-CM | POA: Diagnosis present

## 2020-04-23 DIAGNOSIS — F419 Anxiety disorder, unspecified: Secondary | ICD-10-CM | POA: Diagnosis not present

## 2020-04-23 DIAGNOSIS — F411 Generalized anxiety disorder: Secondary | ICD-10-CM

## 2020-04-23 DIAGNOSIS — G459 Transient cerebral ischemic attack, unspecified: Secondary | ICD-10-CM | POA: Diagnosis not present

## 2020-04-23 DIAGNOSIS — E876 Hypokalemia: Secondary | ICD-10-CM | POA: Diagnosis not present

## 2020-04-23 DIAGNOSIS — R531 Weakness: Secondary | ICD-10-CM | POA: Diagnosis present

## 2020-04-23 DIAGNOSIS — Z87891 Personal history of nicotine dependence: Secondary | ICD-10-CM | POA: Diagnosis not present

## 2020-04-23 DIAGNOSIS — R000 Tachycardia, unspecified: Secondary | ICD-10-CM | POA: Diagnosis not present

## 2020-04-23 DIAGNOSIS — R7303 Prediabetes: Secondary | ICD-10-CM | POA: Insufficient documentation

## 2020-04-23 DIAGNOSIS — R42 Dizziness and giddiness: Secondary | ICD-10-CM | POA: Diagnosis not present

## 2020-04-23 LAB — COMPREHENSIVE METABOLIC PANEL
ALT: 20 U/L (ref 0–44)
AST: 21 U/L (ref 15–41)
Albumin: 3.8 g/dL (ref 3.5–5.0)
Alkaline Phosphatase: 62 U/L (ref 38–126)
Anion gap: 9 (ref 5–15)
BUN: 19 mg/dL (ref 8–23)
CO2: 29 mmol/L (ref 22–32)
Calcium: 10 mg/dL (ref 8.9–10.3)
Chloride: 100 mmol/L (ref 98–111)
Creatinine, Ser: 0.84 mg/dL (ref 0.44–1.00)
GFR, Estimated: >60 mL/min (ref >60)
Glucose, Bld: 102 mg/dL — ABNORMAL HIGH (ref 70–99)
Potassium: 3.1 mmol/L — ABNORMAL LOW (ref 3.5–5.1)
Sodium: 138 mmol/L (ref 135–145)
Total Bilirubin: 0.4 mg/dL (ref 0.3–1.2)
Total Protein: 8 g/dL (ref 6.5–8.1)

## 2020-04-23 LAB — RAPID URINE DRUG SCREEN, HOSP PERFORMED
Amphetamines: NOT DETECTED
Barbiturates: NOT DETECTED
Benzodiazepines: NOT DETECTED
Cocaine: NOT DETECTED
Opiates: NOT DETECTED
Tetrahydrocannabinol: NOT DETECTED

## 2020-04-23 LAB — DIFFERENTIAL
Abs Immature Granulocytes: 0.01 10*3/uL (ref 0.00–0.07)
Basophils Absolute: 0.1 10*3/uL (ref 0.0–0.1)
Basophils Relative: 1 %
Eosinophils Absolute: 0.3 10*3/uL (ref 0.0–0.5)
Eosinophils Relative: 4 %
Immature Granulocytes: 0 %
Lymphocytes Relative: 33 %
Lymphs Abs: 2.2 10*3/uL (ref 0.7–4.0)
Monocytes Absolute: 0.6 10*3/uL (ref 0.1–1.0)
Monocytes Relative: 9 %
Neutro Abs: 3.4 10*3/uL (ref 1.7–7.7)
Neutrophils Relative %: 53 %

## 2020-04-23 LAB — URINALYSIS, MICROSCOPIC (REFLEX)

## 2020-04-23 LAB — URINALYSIS, ROUTINE W REFLEX MICROSCOPIC
Bilirubin Urine: NEGATIVE
Glucose, UA: NEGATIVE mg/dL
Ketones, ur: NEGATIVE mg/dL
Leukocytes,Ua: NEGATIVE
Nitrite: NEGATIVE
Protein, ur: NEGATIVE mg/dL
Specific Gravity, Urine: 1.025 (ref 1.005–1.030)
pH: 5 (ref 5.0–8.0)

## 2020-04-23 LAB — CBC
HCT: 43.3 % (ref 36.0–46.0)
Hemoglobin: 13.6 g/dL (ref 12.0–15.0)
MCH: 28.1 pg (ref 26.0–34.0)
MCHC: 31.4 g/dL (ref 30.0–36.0)
MCV: 89.5 fL (ref 80.0–100.0)
Platelets: 294 10*3/uL (ref 150–400)
RBC: 4.84 MIL/uL (ref 3.87–5.11)
RDW: 13.8 % (ref 11.5–15.5)
WBC: 6.5 10*3/uL (ref 4.0–10.5)
nRBC: 0 % (ref 0.0–0.2)

## 2020-04-23 LAB — PROTIME-INR
INR: 1 (ref 0.8–1.2)
Prothrombin Time: 12.8 seconds (ref 11.4–15.2)

## 2020-04-23 LAB — RESP PANEL BY RT-PCR (FLU A&B, COVID) ARPGX2
Influenza A by PCR: NEGATIVE
Influenza B by PCR: NEGATIVE
SARS Coronavirus 2 by RT PCR: POSITIVE — AB

## 2020-04-23 LAB — ETHANOL: Alcohol, Ethyl (B): <10 mg/dL (ref <10)

## 2020-04-23 LAB — APTT: aPTT: 29 seconds (ref 24–36)

## 2020-04-23 LAB — CBG MONITORING, ED: Glucose-Capillary: 90 mg/dL (ref 70–99)

## 2020-04-23 MED ORDER — POTASSIUM CHLORIDE CRYS ER 20 MEQ PO TBCR
40.0000 meq | EXTENDED_RELEASE_TABLET | Freq: Once | ORAL | Status: AC
  Administered 2020-04-23: 40 meq via ORAL
  Filled 2020-04-23: qty 2

## 2020-04-23 NOTE — ED Notes (Signed)
Assumed care of this patient. Vitals taken. NAD. A&Ox4. Patient able to speak full sentences. Connected to cardiac monitor, bp, pulse ox. Awaiting transfer to .

## 2020-04-23 NOTE — Progress Notes (Signed)
Swallowed water with no issues. VSS. Changed diet from NPO to regular diet. Pt in no distress. No stroke-like symptoms present. No COVID symptoms present. Will continue to monitor.

## 2020-04-23 NOTE — Progress Notes (Addendum)
[facility] to [facility] transfer -   Patient with h/o RLS; preDM; HTN; asthma; and HLD presenting with R-sided numbness/weakness/tingling.  It appears to be a TIA - no prior h/o the same.  Generally healthy.  Out shopping with acute loss of sensation - face and body.  She was able to continue to stand.  No headache or visual symptoms.  Symptoms resolved spontaneously within a few minutes.  Needs completion of TIA evaluation.   Will place order for telemetry observation.  Late report that patient is also incidentally COVID +.  Would likely qualify for 3 days of remdesivir for incidental COVID. She is fully vaccinated and completely asymptomatic.   Carlyon Shadow, M.D.

## 2020-04-23 NOTE — ED Notes (Signed)
Patient taken to CT scan by this RN

## 2020-04-23 NOTE — H&P (Signed)
History and Physical    PLEASE NOTE THAT DRAGON DICTATION SOFTWARE WAS USED IN THE CONSTRUCTION OF THIS NOTE.   [name]:[id number] DOB: Aug 16, 1945 DOA: 04/23/2020  PCP: Audley Hose, MD Patient coming from: home   I have personally briefly reviewed patient's old medical records in [place]  Chief Complaint: Right-sided numbness  HPI: Melinda Mckay is a 75 y.o. female with medical history significant for prediabetes, asthma, hypertension, hyperlipidemia, generalized anxiety disorder, who is admitted to [facility] on 04/23/20 by way of transfer from [place] ED after presenting from home to the latter facility complaining of right-sided numbness.  The patient reports that she was in her normal state of health when she developed acute onset right-sided numbness involving the right upper and lower extremity as well as the right portion of the face at approximately 1430 on 04/23/20.  She reports associated paresthesias in this distribution, as well as possible mild weakness in the right upper and lower extremity.  She reports that the symptoms lasted for approximately 3 to 4 minutes before spontaneously resolving, without any residual symptoms, and without subsequent return of the symptoms.  She denies any associated dysphagia, dizziness, vertigo, nausea, vomiting, change in vision, blurry vision, diplopia, word finding difficulties, or headache.  She also denies any associated facial droop or slurring of speech.  Reports she has never previously experienced symptoms similar to the unilateral numbness and weakness with which she presented this evening, prompting her to present to [place] ED on 04/23/2020 for further evaluation of the above.   She denies any associated chest pain, shortness of breath, palpitations, diaphoresis, presyncope, or syncope.  Denies any recent trauma.  Denies any previous history of stroke or TIA.  In terms of  modifiable CVA risk factors, the patient acknowledges a history of hypertension, hyperlipidemia, prediabetes.  She denies any known history of underlying atrial fibrillation or obstructive sleep apnea.  She reports that she is a former smoker, having smoked half pack per day for approximately 20 years before completely quitting smoking in the mid 1990s, without any subsequent resumption of smoking.  On a daily baby aspirin as an outpatient.  Additionally, patient reports that she is on rosuvastatin 10 mg p.o. daily at home.   Of note, the patient reports that she has received the [place] COVID-19 vaccine x2 in January and February 2021, followed by [employer] booster in September 2021.  She denies any recent subjective fever, chills, rigors, or generalized myalgias.  Denies any recent neck stiffness, rhinitis, rhinorrhea, sore throat, shortness of breath, wheezing, cough, abdominal pain, diarrhea, or rash.  No recent traveling or known COVID-19 exposures.  She also denies any recent dysuria, gross hematuria, or change in urinary urgency/frequency.     [facility] ED Course:  Vital signs in the ED were notable for the following: Temperature max 98.7; heart rate 59-72; blood pressure 144/92 - 161/94; respiratory rate 16-19; oxygen saturation 95 to 100% on room air.  Labs were notable for the following: CMP was notable for the following: Sodium 138, potassium 3.1, bicarbonate 29, creatinine 0.82, glucose 102.  CBC was notable for white blood cell count of 6500, hemoglobin 13.6.  INR 1.0.  Urinalysis showed no evidence of white blood cells, and was nitrite/leukocyte esterase negative.  Serum ethanol less than 10.  Urinary drug screen was pan negative.  Routine screening nasopharyngeal COVID-19 PCR performed at [facility] ED on 04/23/2020 was found to  be positive.   EKG showed sinus rhythm with PAC and heart rate 68, normal intervals, and no evidence of T wave or ST changes, including no evidence of ST  elevation.  Noncontrast CT of the head showed no evidence of acute intracranial process, including no evidence of intracranial hemorrhage or acute infarct.   The patient was subsequently accepted for transfer from [place] ED to [facility] for further evaluation management of suspected TIA.   While in [facility] ED, the following were administered: Potassium chloride 40 mEq p.o. x1.     Review of Systems: As per HPI otherwise 10 point review of systems negative.   Past Medical History:  Diagnosis Date  . Anxiety   . Asthma   . Edema of lower extremity   . Hyperlipidemia   . Hypertension   . Prediabetes   . Restless legs     Past Surgical History:  Procedure Laterality Date  . bone spur removal Right   . BUNIONECTOMY Bilateral 2005  . PARTIAL HYSTERECTOMY  2008  . TUBAL LIGATION  1982    Social History:  reports that she quit smoking about 27 years ago. Her smoking use included cigarettes. She has a 8.00 pack-year smoking history. She has never used smokeless tobacco. She reports that she does not drink alcohol and does not use drugs.   No Known Allergies  Family History  Problem Relation Age of Onset  . Cancer Sister        KIDNEY   . Allergies Sister   . Asthma Sister   . Allergies Brother   . Diabetes Brother   . Hypertension Brother   . Asthma Brother      Prior to Admission medications   Medication Sig Start Date End Date Taking? Authorizing Provider  acetaminophen (TYLENOL) 500 MG tablet Take 500 mg by mouth every 6 (six) hours as needed for mild pain.   Yes [provider]  amLODipine (NORVASC) 10 MG tablet Take 0.5 tablets by mouth daily.  11/03/13  Yes [provider]  aspirin 81 MG chewable tablet Chew 81 mg by mouth daily.   Yes [provider]  beclomethasone (QVAR REDIHALER) 80 MCG/ACT inhaler Inhale 2 puffs into the lungs daily. 01/17/20  Yes Tanda Rockers, MD  esomeprazole (NEXIUM) 40 MG capsule Take 40 mg by mouth  daily as needed.   Yes [provider]  hydrochlorothiazide (HYDRODIURIL) 25 MG tablet Take 25 mg by mouth daily.   Yes [provider]  hydrOXYzine (ATARAX/VISTARIL) 25 MG tablet Take 25 mg by mouth at bedtime as needed.   Yes [provider]  losartan (COZAAR) 50 MG tablet losartan 50 mg tablet  Take 1 tablet every day by oral route for 30 days.   Yes [provider]  potassium chloride SA (K-DUR,KLOR-CON) 20 MEQ tablet Take 20 mEq by mouth daily.   Yes [provider]  rosuvastatin (CRESTOR) 20 MG tablet rosuvastatin 20 mg tablet  takes 12m daily   Yes [provider]  albuterol (PROAIR HFA) 108 (90 Base) MCG/ACT inhaler 2 puffs every 4 hours as needed only  if your can't catch your breath 01/17/20   WTanda Rockers MD  ALPRAZolam (Duanne Moron 1 MG tablet Take 1 mg by mouth 2 (two) times daily as needed for anxiety.    [provider]     Objective    Physical Exam: Vitals:   04/23/20 2128 04/23/20 2225 04/23/20 2300 04/23/20 2332  BP: ([name]  153/99 (!) 158/89  (!) 143/98  Pulse: (!) 59 (!) 59  72  Resp: '16 17  18  ' Temp:  98.7 F (37.1 C)  97.9 F (36.6 C)  TempSrc:  Oral  Oral  SpO2: 98% 99% 100% 95%  Weight:      Height:        General: appears to be stated age; alert, oriented Skin: warm, dry, no rash Head:  AT/[place] Mouth:  Oral mucosa membranes appear moist, normal dentition Neck: supple; trachea midline Heart:  RRR; did not appreciate any M/R/G Lungs: CTAB, did not appreciate any wheezes, rales, or rhonchi Abdomen: + BS; soft, ND, NT Vascular: 2+ pedal pulses b/l; 2+ radial pulses b/l Extremities: no peripheral edema, no muscle wasting Neuro: 5/5 strength of the proximal and distal flexors and extensors of the upper and lower extremities bilaterally; sensation intact in upper and lower extremities b/l; cranial nerves II through XII grossly intact; no pronator drift; no evidence suggestive of slurred speech,  dysarthria, or facial droop; Normal muscle tone. No tremors.   Labs on Admission: I have personally reviewed following labs and imaging studies  CBC: Recent Labs  Lab 04/23/20 1538  WBC 6.5  NEUTROABS 3.4  HGB 13.6  HCT 43.3  MCV 89.5  PLT 482   Basic Metabolic Panel: Recent Labs  Lab 04/23/20 1538  NA 138  K 3.1*  CL 100  CO2 29  GLUCOSE 102*  BUN 19  CREATININE 0.84  CALCIUM 10.0   GFR: Estimated Creatinine Clearance: 62.9 mL/min (by C-G formula based on SCr of 0.84 mg/dL). Liver Function Tests: Recent Labs  Lab 04/23/20 1538  AST 21  ALT 20  ALKPHOS 62  BILITOT 0.4  PROT 8.0  ALBUMIN 3.8   No results for input(s): LIPASE, AMYLASE in the last 168 hours. No results for input(s): AMMONIA in the last 168 hours. Coagulation Profile: Recent Labs  Lab 04/23/20 1538  INR 1.0   Cardiac Enzymes: No results for input(s): CKTOTAL, CKMB, CKMBINDEX, TROPONINI in the last 168 hours. BNP (last 3 results) No results for input(s): PROBNP in the last 8760 hours. HbA1C: No results for input(s): HGBA1C in the last 72 hours. CBG: Recent Labs  Lab 04/23/20 1502  GLUCAP 90   Lipid Profile: No results for input(s): CHOL, HDL, LDLCALC, TRIG, CHOLHDL, LDLDIRECT in the last 72 hours. Thyroid Function Tests: No results for input(s): TSH, T4TOTAL, FREET4, T3FREE, THYROIDAB in the last 72 hours. Anemia Panel: No results for input(s): VITAMINB12, FOLATE, FERRITIN, TIBC, IRON, RETICCTPCT in the last 72 hours. Urine analysis:    Component Value Date/Time   COLORURINE YELLOW 04/23/2020 1538   APPEARANCEUR CLEAR 04/23/2020 1538   LABSPEC 1.025 04/23/2020 1538   PHURINE 5.0 04/23/2020 1538   GLUCOSEU NEGATIVE 04/23/2020 1538   HGBUR SMALL (A) 04/23/2020 1538   BILIRUBINUR NEGATIVE 04/23/2020 1538   KETONESUR NEGATIVE 04/23/2020 1538   PROTEINUR NEGATIVE 04/23/2020 1538   UROBILINOGEN 0.2 06/21/2009 0916   NITRITE NEGATIVE 04/23/2020 1538   LEUKOCYTESUR NEGATIVE  04/23/2020 1538    Radiological Exams on Admission: CT Head Wo Contrast  Result Date: 04/23/2020 CLINICAL DATA:  Dizziness and right sided weakness starting 1.5 hours ago; no h/o stroke; no headache; no vision changes EXAM: CT HEAD WITHOUT CONTRAST TECHNIQUE: Contiguous axial images were obtained from the base of the skull through the vertex without intravenous contrast. COMPARISON:  02/28/2017 FINDINGS: Brain: No evidence of acute infarction, hemorrhage, hydrocephalus, extra-axial collection or mass lesion/mass effect. Mild patchy white matter  hypoattenuation consistent with chronic microvascular ischemic change. Incidental note made of an expanded empty sella, unchanged. Vascular: No hyperdense vessel or unexpected calcification. Skull: Normal. Negative for fracture or focal lesion. Sinuses/Orbits: Globes and orbits are unremarkable. Sinuses are clear. Other: None. IMPRESSION: 1. No acute intracranial abnormalities. 2. No change from the previous study. Electronically Signed   By: Lajean Manes M.D.   On: 04/23/2020 15:41     EKG: Independently reviewed, with result as described above.    Assessment/Plan   VASILIA DISE is a 75 y.o. female with medical history significant for prediabetes, asthma, hypertension, hyperlipidemia, generalized anxiety disorder, who is admitted to [facility] on 04/23/20 by way of transfer from [place] ED after presenting from home to the latter facility complaining of right-sided numbness.   Principal Problem:   TIA (transient ischemic attack) Active Problems:   Anxiety disorder   Essential hypertension   Hyperlipidemia   Hypokalemia   Lab test positive for detection of COVID-19 virus     #) TIA: Suspected diagnosis on the basis of acute onset of right-sided numbness/paresthesias as well as potential right hemiparesis at approximately 1430 on 04/23/20, with spontaneous incomplete resolution of the symptoms within 3 to 4 minutes of  onset.  No subsequent recurrence of the symptoms, and the patient is without evidence of acute focal neurologic deficit at this time.  Noncontrast CT of the head showed no evidence of acute intracranial process, including no evidence of acute intracranial hemorrhage or acute infarct.  No prior history of the symptoms, the patient denies any known history of stroke or TIA.  She is being admitted for observation for further evaluation management of suspected TIA, including additional imaging as outlined below, as well as evaluation for potential modifiable CVA risk factors, as further detailed below.  In terms of further evaluation for potential modifiable acute ischemic CVA risk factors, will check hemoglobin A1c, lipid panel.  Also check TTE with bubble study to evaluate for intracardiac thrombus, septal aneurysm, or septal wall defect.  Pulse monitor on telemetry to evaluate for the presence of any previously undiagnosed atrial fibrillation.  Of note, the patient reports modifiable CV risk factors in of hypertension, hyperlipidemia.  She also acknowledges a history of prediabetes as well as a prior history of smoking, as further noted above.  She denies any known history of underlying atrial fibrillation or obstructive sleep apnea.  EKG performed at [facility] ED today showed sinus rhythm.  Patient does not appear to be criteria given that her apparent acute focal neurologic deficits have completely spontaneously resolved, without any recurrence or evidence of residual acute neurologic deficits.  Current outpatient antiplatelet regimen consists of daily baby aspirin, she is not currently on any formal anticoagulation at home.  Current outpatient antilipid regimen consists of rosuvastatin 10 mg p.o. daily. Will allow for permissive hypertension for 24 hours following the onset of patient's acute focal neurologic deficits at 1430 on 04/23/20, with associated parameters further quantified below.  Of  note, upon arrival at [facility], the patient has passed her nursing bedside swallow evaluation, and has been NPO leading up to this.     Plan: Head of the bed at 30 degrees. Neuro checks per protocol. VS per protocol. Will allow for permissive hypertension for 24 hours following onset of acute focal neurologic deficits, during which will hold home antihypertensive medications. Monitor on telemetry, including monitoring for atrial fibrillation as modifiable risk factor for acute ischemic CVA.  Should  overnight telemetry not demonstrate evidence of atrial fibrillation, can consider 30-day cardiac event monitor at the time of discharge to further evaluate for this arrhythmia. MRI brain. Bilateral carotid US. TTE with bubble study has been ordered for the morning to evaluate for intracardiac thrombus, septal wall aneurysm, or septal wall defect. Check lipid panel and A1c. PT/OT consults have been ordered to occur in the morning.  In the setting of currently suspected TIA, will continue home daily baby aspirin, with close attention for ensuing MRI results, which, suggestive of acute ischemic CVA may warrant a brief course of dual antiplatelet therapy.  Continue rosuvastatin, with next dose to occur now.      #) Hypokalemia: Presenting serum potassium noted to be 3.1.  It appears that the patient is on daily potassium supplementation at home, in which she takes potassium chloride 20 mEq p.o. daily.  Of note, she received potassium chloride 40 mEq p.o. x1.  [place] ED.  Plan: Add on serum magnesium level.  Repeat BMP in the morning.  Monitor on telemetry.      #) positive COVID-19 result: routine nasopharyngeal COVID-19 PCR screening performed on 04/23/2020 at [facility] ED was found to be positive, with no prior COVID-19 testing results available in our EMR for point of comparison. This result is in the context of no recent respiratory symptoms and no additional recent symptoms felt to  be consistent with COVID-19 infection.  As the screening COVID-19 test was performed via PCR, the positive result may reflect a prior infxn from up to 3 months ago. of note, this positive COVID-19 finding does not represent the reason for admission. Presentation does not appear to be associated with acute hypoxic respiratory distress/failure, with patient maintaining O2 sats greater than 94% on room air. Consequently, there does not appear to be an indication at this time for initiation of dexamethasone per treatment guidance recommendations from [facility]'s Covid Treatment Guidelines. As the patient is being admitted for a completely different reason relative to the positive COVID-19 screening result, and as this patient appears asymptomatic from a COVID-19 standpoint, criteria do not appear to be met for initiation of a 3 day course of remdesivir at this time per treatment guidance recommendations from [facility]'s Covid Treatment Guidelines, although we will closely monitor ensuing respiratory status, VS, and general inflammatory markers for further clinical correlation in this patient who meets high risk criteria based upon age and comorbidities.  Overall,  Will closely monitor ensuing clinical trend, including monitoring for development of respiratory symptoms as well as evidence of acute hypoxic respiratory distress, while providing interval supportive care, as further described below. Denies any known or suspected COVID-19 exposures. Of note, the patient has received 2 doses of Pf[employer]'s COVID-19 vaccine, including receipt of the booster in September 2021.     Plan: Airborne and contact precautions. Monitor continuous pulse oximetry, with close monitoring for development of new supplemental oxygen requirement, as above. prn supplemental O2 to maintain O2 sats greater than or equal to 94%. Refraining from initiation of dexamethasone and remdesivir, as above. Check inflammatory markers (fibrinogen, d  dimer, crp, ferritin, LDH) in the morning.  As needed albuterol inhaler.  Flutter valve, incentive spirometer. Prn CXR should patient subsequently developed any respiratory symptoms.  Repeat CBC in the morning.        #) mild persistent asthma: Documented history of such, with the patient reporting good compliance with her home respiratory regimen which consists of daily Qvar as  well as as needed albuterol.  No evidence to suggest an acute exacerbation at this time, although we will closely monitor for subsequent evidence of development of such, particularly in the setting of the aforementioned routine screening COVID-19 PCR result.   Plan: Continue home Qvar.  As needed albuterol inhaler.  Monitor continuous pulse oximetry.  Add on serum magnesium level.  Check serum phosphorus level.       #) Essential hypertension: Outpatient hypertensive regimen consist of the following: Norvasc, HCTZ, and losartan.  Presenting systolic blood pressures have been in the range of the 140s to 160s mmHg. in the setting of suspected presenting TIA, with now resolved onset of suspected acute focal neurologic deficits starting at 1430 on 04/23/20, will observe permissive hypertension for 24 hours from this time,, as further described above.   Plan: Permissive hypertension for 24 hours from 1430 on 04/23/20.  As such, will hold home antihypertensive medications over that time. Prn IV antihypertensive for systolic blood pressure greater than 110 or diastolic blood pressure greater than 110 over that time.  Close monitoring of ensuing blood pressure via routine vital signs outlined in TIA order set.     #) Hyperlipidemia: On rosuvastatin 10 mg p.o. nightly.   Plan: Continue rosuvastatin dose for now.  Check lipid panel as competitive TIA work-up.  Additional evaluation management of suspected TIA, including MRI brain, as above.  Consider increasing dose of rosuvastatin depending upon result of ensuing lipid panel as  well as testing result of MRI brain.       #) Generalized anxiety disorder: On as needed Xanax and as needed hydroxyzine as an outpatient.  In the setting of suspected TIA, will proceed with as needed hydroxyzine for now so as to limit potential central acting confounding variables in order to optimize evaluation thereof.   Plan: Continue home as needed hydroxyzine.  We will hold home as needed Xanax for now, as above.     DVT prophylaxis: Lovenox 40 mg sq Qdaily  Code Status: Full code Family Communication: none Disposition Plan: Per Rounding Team Consults called: none  Admission status: Observation; med telemetry  COVID-19 Vaccination status: Has received 2 doses of Pfizer's COVID-19 vaccine in January and February, 2021, followed by booster in September 2021.    Of note, this patient was added by me to the following Admit List/Treatment Team:  mcadmits     PLEASE NOTE THAT [employer] SOFTWARE WAS USED IN THE CONSTRUCTION OF THIS NOTE.   Rhetta Mura DO [facility] Pager [phone] From [place]  04/23/2020, 11:44 PM

## 2020-04-23 NOTE — ED Triage Notes (Signed)
Pt reports about 30 mins ago felt numbness and tingling down entire right side from face to feet. Pt talking in complete sentences, no facial droop noted, equal grips.

## 2020-04-23 NOTE — ED Provider Notes (Addendum)
[facility] EMERGENCY DEPARTMENT Provider Note   CSN: [id number] Arrival date & time: 04/23/20  1456     History Chief Complaint  Patient presents with  . Weakness    Melinda Mckay is a 75 y.o. female.  HPI Patient was out shopping.  She reports at 2:30 PM she very abruptly felt numb all over the entire right side of her body.  Included her face and both extremities.  She reports she just paused for a moment and gave it several minutes before she tried to do anything.  She reports she did not fall to the ground.  She reports her leg and arm did not give way.  He did not have any loss of vision.  She reports that after couple minutes symptoms seem to have resolved and she came straight to the emergency department.  Time of evaluation she denies residual symptoms.  She has never had a similar episode.  She did not have any associated headache or chest pain.  No nausea no vomiting.  She felt well prior to onset of symptoms.    Past Medical History:  Diagnosis Date  . Anxiety   . Asthma   . Edema of lower extremity   . Hyperlipidemia   . Hypertension   . Prediabetes   . Restless legs     Patient Active Problem List   Diagnosis Date Noted  . TIA (transient ischemic attack) 04/23/2020  . Diverticulosis of colon 01/16/2018  . Internal hemorrhoids 01/16/2018  . Tubular adenoma 01/16/2018  . Paresthesias 04/28/2017  . Anxiety disorder 11/05/2016  . Edema of lower extremity 11/05/2016  . Essential hypertension 11/05/2016  . Hyperlipidemia 11/05/2016  . Prediabetes 11/05/2016  . Restless legs 11/05/2016  . Mild persistent asthma in adult without complication 70/26/3785    Past Surgical History:  Procedure Laterality Date  . bone spur removal Right   . BUNIONECTOMY Bilateral 2005  . PARTIAL HYSTERECTOMY  2008  . TUBAL LIGATION  1982     OB History   No obstetric history on file.     Family History  Problem Relation Age of Onset  . Cancer Sister         KIDNEY   . Allergies Sister   . Asthma Sister   . Allergies Brother   . Diabetes Brother   . Hypertension Brother   . Asthma Brother     Social History   Tobacco Use  . Smoking status: Former Smoker    Packs/day: 0.40    Years: 20.00    Pack years: 8.00    Types: Cigarettes    Quit date: 03/11/1993    Years since quitting: 27.1  . Smokeless tobacco: Never Used  . Tobacco comment: PT STATES ABOUT 1 PACK A WEEK   Vaping Use  . Vaping Use: Never used  Substance Use Topics  . Alcohol use: No    Comment: quit 2015  . Drug use: No    Home Medications Prior to Admission medications   Medication Sig Start Date End Date Taking? Authorizing Provider  acetaminophen (TYLENOL) 500 MG tablet Take 500 mg by mouth every 6 (six) hours as needed for mild pain.   Yes [provider]  amLODipine (NORVASC) 10 MG tablet Take 0.5 tablets by mouth daily.  11/03/13  Yes [provider]  aspirin 81 MG chewable tablet Chew 81 mg by mouth daily.   Yes [provider]  beclomethasone (QVAR REDIHALER) 80 MCG/ACT inhaler Inhale 2 puffs into  the lungs daily. 01/17/20  Yes Tanda Rockers, MD  esomeprazole (NEXIUM) 40 MG capsule Take 40 mg by mouth daily as needed.   Yes [provider]  hydrochlorothiazide (HYDRODIURIL) 25 MG tablet Take 25 mg by mouth daily.   Yes [provider]  hydrOXYzine (ATARAX/VISTARIL) 25 MG tablet Take 25 mg by mouth at bedtime as needed.   Yes [provider]  losartan (COZAAR) 50 MG tablet losartan 50 mg tablet  Take 1 tablet every day by oral route for 30 days.   Yes [provider]  potassium chloride SA (K-DUR,KLOR-CON) 20 MEQ tablet Take 20 mEq by mouth daily.   Yes [provider]  rosuvastatin (CRESTOR) 20 MG tablet rosuvastatin 20 mg tablet  takes 10mg  daily   Yes [provider]  albuterol (PROAIR HFA) 108 (90 Base) MCG/ACT inhaler 2 puffs every 4 hours as needed only  if your can't catch  your breath 01/17/20   Tanda Rockers, MD  ALPRAZolam Duanne Moron) 1 MG tablet Take 1 mg by mouth 2 (two) times daily as needed for anxiety.    [provider]    Allergies    Patient has no known allergies.  Review of Systems   Review of Systems 10 Systems reviewed and negative except as per HPI Physical Exam Updated Vital Signs BP (!) 144/92 (BP Location: Right Arm)   Pulse 62   Temp 98.1 F (36.7 C) (Oral)   Resp 19   Ht 5\' 5"  (1.651 m)   Wt 81.6 kg   SpO2 99%   BMI 29.95 kg/m   Physical Exam Constitutional:      Appearance: She is well-developed and well-nourished.  HENT:     Head: Normocephalic and atraumatic.  Eyes:     Extraocular Movements: EOM normal.     Pupils: Pupils are equal, round, and reactive to light.  Cardiovascular:     Rate and Rhythm: Normal rate and regular rhythm.     Pulses: Intact distal pulses.     Heart sounds: Normal heart sounds.  Pulmonary:     Effort: Pulmonary effort is normal.     Breath sounds: Normal breath sounds.  Abdominal:     General: Bowel sounds are normal. There is no distension.     Palpations: Abdomen is soft.     Tenderness: There is no abdominal tenderness.  Musculoskeletal:        General: No edema. Normal range of motion.     Cervical back: Neck supple.  Skin:    General: Skin is warm, dry and intact.  Neurological:     General: No focal deficit present.     Mental Status: She is alert and oriented to person, place, and time.     GCS: GCS eye subscore is 4. GCS verbal subscore is 5. GCS motor subscore is 6.     Cranial Nerves: No cranial nerve deficit.     Sensory: No sensory deficit.     Motor: No weakness.     Coordination: Coordination normal.     Deep Tendon Reflexes: Strength normal.  Psychiatric:        Mood and Affect: Mood and affect and mood normal.     ED Results / Procedures / Treatments   Labs (all labs ordered are listed, but only abnormal results are displayed) Labs Reviewed  RESP  PANEL BY RT-PCR (FLU A&B, COVID) ARPGX2 - Abnormal; Notable for the following components:      Result Value  SARS Coronavirus 2 by RT PCR POSITIVE (*)    All other components within normal limits  COMPREHENSIVE METABOLIC PANEL - Abnormal; Notable for the following components:   Potassium 3.1 (*)    Glucose, Bld 102 (*)    All other components within normal limits  URINALYSIS, ROUTINE W REFLEX MICROSCOPIC - Abnormal; Notable for the following components:   Hgb urine dipstick SMALL (*)    All other components within normal limits  URINALYSIS, MICROSCOPIC (REFLEX) - Abnormal; Notable for the following components:   Bacteria, UA RARE (*)    All other components within normal limits  ETHANOL  PROTIME-INR  APTT  CBC  DIFFERENTIAL  RAPID URINE DRUG SCREEN, HOSP PERFORMED  CBG MONITORING, ED    EKG EKG Interpretation  Date/Time:  Sunday April 23 2020 15:42:13 EST Ventricular Rate:  68 PR Interval:    QRS Duration: 80 QT Interval:  410 QTC Calculation: 436 R Axis:   35 Text Interpretation: Sinus rhythm Atrial premature complex no change from previous Confirmed by Charlesetta Shanks [phone]) on 04/23/2020 4:06:44 PM   Radiology CT Head Wo Contrast  Result Date: 04/23/2020 CLINICAL DATA:  Dizziness and right sided weakness starting 1.5 hours ago; no h/o stroke; no headache; no vision changes EXAM: CT HEAD WITHOUT CONTRAST TECHNIQUE: Contiguous axial images were obtained from the base of the skull through the vertex without intravenous contrast. COMPARISON:  02/28/2017 FINDINGS: Brain: No evidence of acute infarction, hemorrhage, hydrocephalus, extra-axial collection or mass lesion/mass effect. Mild patchy white matter hypoattenuation consistent with chronic microvascular ischemic change. Incidental note made of an expanded empty sella, unchanged. Vascular: No hyperdense vessel or unexpected calcification. Skull: Normal. Negative for fracture or focal lesion. Sinuses/Orbits: Globes and  orbits are unremarkable. Sinuses are clear. Other: None. IMPRESSION: 1. No acute intracranial abnormalities. 2. No change from the previous study. Electronically Signed   By: Lajean Manes M.D.   On: 04/23/2020 15:41    Procedures Procedures   Medications Ordered in ED Medications  potassium chloride SA (KLOR-CON) CR tablet 40 mEq (40 mEq Oral Given 04/23/20 1833)    ED Course  I have reviewed the triage vital signs and the nursing notes.  Pertinent labs & imaging results that were available during my care of the patient were reviewed by me and considered in my medical decision making (see chart for details).    MDM Rules/Calculators/A&P                         Consult: Reviewed with Dr. Lorin Mercy  Patient presents with an episode of numbness that lasted several minutes.  This encompassed all the right side of her body.  No associated headache.  Patient did not try to ambulate or move to assess for focal weakness.  She does not endorse any visual symptoms.  By the time of her evaluation symptoms had resolved.  Patient is otherwise been healthy recently.  She has been feeling well and active.  No prior stroke or TIA history.  With patient's age and risk factors, concern for TIA.  We will plan for admission for medical work-up.  Additionally, patient's Covid has returned positive.  She is fully vaccinated.  She has been completely asymptomatic by her report.  She is well in appearance.  Final Clinical Impression(s) / ED Diagnoses Final diagnoses:  TIA (transient ischemic attack)  COVID    Rx / DC Orders ED Discharge Orders    None  Charlesetta Shanks, MD 04/23/20 2050    Charlesetta Shanks, MD 04/23/20 2052

## 2020-04-24 ENCOUNTER — Observation Stay (HOSPITAL_BASED_OUTPATIENT_CLINIC_OR_DEPARTMENT_OTHER): Payer: BC Managed Care – PPO

## 2020-04-24 ENCOUNTER — Other Ambulatory Visit (HOSPITAL_COMMUNITY): Payer: Medicare Other

## 2020-04-24 ENCOUNTER — Observation Stay (HOSPITAL_COMMUNITY): Payer: BC Managed Care – PPO

## 2020-04-24 DIAGNOSIS — G459 Transient cerebral ischemic attack, unspecified: Secondary | ICD-10-CM

## 2020-04-24 DIAGNOSIS — E876 Hypokalemia: Secondary | ICD-10-CM | POA: Diagnosis present

## 2020-04-24 DIAGNOSIS — U071 COVID-19: Secondary | ICD-10-CM | POA: Diagnosis present

## 2020-04-24 DIAGNOSIS — R531 Weakness: Secondary | ICD-10-CM | POA: Diagnosis not present

## 2020-04-24 DIAGNOSIS — R42 Dizziness and giddiness: Secondary | ICD-10-CM | POA: Diagnosis not present

## 2020-04-24 DIAGNOSIS — I10 Essential (primary) hypertension: Secondary | ICD-10-CM | POA: Diagnosis not present

## 2020-04-24 LAB — COMPREHENSIVE METABOLIC PANEL
ALT: 15 U/L (ref 0–44)
AST: 19 U/L (ref 15–41)
Albumin: 3.1 g/dL — ABNORMAL LOW (ref 3.5–5.0)
Alkaline Phosphatase: 54 U/L (ref 38–126)
Anion gap: 9 (ref 5–15)
BUN: 16 mg/dL (ref 8–23)
CO2: 28 mmol/L (ref 22–32)
Calcium: 9.7 mg/dL (ref 8.9–10.3)
Chloride: 104 mmol/L (ref 98–111)
Creatinine, Ser: 0.72 mg/dL (ref 0.44–1.00)
GFR, Estimated: >60 mL/min (ref >60)
Glucose, Bld: 113 mg/dL — ABNORMAL HIGH (ref 70–99)
Potassium: 3.8 mmol/L (ref 3.5–5.1)
Sodium: 141 mmol/L (ref 135–145)
Total Bilirubin: 0.6 mg/dL (ref 0.3–1.2)
Total Protein: 6.8 g/dL (ref 6.5–8.1)

## 2020-04-24 LAB — CBC
HCT: 38.9 % (ref 36.0–46.0)
Hemoglobin: 12.7 g/dL (ref 12.0–15.0)
MCH: 28.7 pg (ref 26.0–34.0)
MCHC: 32.6 g/dL (ref 30.0–36.0)
MCV: 87.8 fL (ref 80.0–100.0)
Platelets: 250 10*3/uL (ref 150–400)
RBC: 4.43 MIL/uL (ref 3.87–5.11)
RDW: 13.8 % (ref 11.5–15.5)
WBC: 5.6 10*3/uL (ref 4.0–10.5)
nRBC: 0 % (ref 0.0–0.2)

## 2020-04-24 LAB — LIPID PANEL
Cholesterol: 171 mg/dL (ref 0–200)
HDL: 56 mg/dL (ref >40)
LDL Cholesterol: 104 mg/dL — ABNORMAL HIGH (ref 0–99)
Total CHOL/HDL Ratio: 3.1 RATIO
Triglycerides: 55 mg/dL (ref <150)
VLDL: 11 mg/dL (ref 0–40)

## 2020-04-24 LAB — C-REACTIVE PROTEIN: CRP: 2.2 mg/dL — ABNORMAL HIGH (ref <1.0)

## 2020-04-24 LAB — ECHOCARDIOGRAM LIMITED BUBBLE STUDY
AR max vel: 1.94 cm2
AV Area VTI: 1.85 cm2
AV Area mean vel: 1.95 cm2
AV Mean grad: 6 mmHg
AV Peak grad: 10.6 mmHg
Ao pk vel: 1.63 m/s
Area-P 1/2: 2.33 cm2
S' Lateral: 1.9 cm

## 2020-04-24 LAB — FERRITIN: Ferritin: 156 ng/mL (ref 11–307)

## 2020-04-24 LAB — D-DIMER, QUANTITATIVE: D-Dimer, Quant: 0.37 ug/mL-FEU (ref 0.00–0.50)

## 2020-04-24 LAB — PHOSPHORUS: Phosphorus: 3.4 mg/dL (ref 2.5–4.6)

## 2020-04-24 LAB — FIBRINOGEN: Fibrinogen: 506 mg/dL — ABNORMAL HIGH (ref 210–475)

## 2020-04-24 LAB — MAGNESIUM
Magnesium: 1.9 mg/dL (ref 1.7–2.4)
Magnesium: 1.9 mg/dL (ref 1.7–2.4)

## 2020-04-24 LAB — HEMOGLOBIN A1C
Hgb A1c MFr Bld: 6.3 % — ABNORMAL HIGH (ref 4.8–5.6)
Mean Plasma Glucose: 134.11 mg/dL

## 2020-04-24 LAB — LACTATE DEHYDROGENASE: LDH: 161 U/L (ref 98–192)

## 2020-04-24 MED ORDER — ALBUTEROL SULFATE HFA 108 (90 BASE) MCG/ACT IN AERS
1.0000 | INHALATION_SPRAY | RESPIRATORY_TRACT | Status: DC | PRN
  Filled 2020-04-24: qty 6.7

## 2020-04-24 MED ORDER — ACETAMINOPHEN 650 MG RE SUPP: 650.0000 mg | Freq: Four times a day (QID) | RECTAL | Status: DC | PRN

## 2020-04-24 MED ORDER — ROSUVASTATIN CALCIUM 5 MG PO TABS
10.0000 mg | ORAL_TABLET | Freq: Every day | ORAL | Status: DC
  Administered 2020-04-24: 10 mg via ORAL
  Filled 2020-04-24: qty 2

## 2020-04-24 MED ORDER — HYDROXYZINE HCL 25 MG PO TABS: 25.0000 mg | ORAL_TABLET | Freq: Three times a day (TID) | ORAL | Status: DC | PRN

## 2020-04-24 MED ORDER — ROSUVASTATIN CALCIUM 20 MG PO TABS: 20.0000 mg | ORAL_TABLET | Freq: Every day | ORAL | 0 refills | Status: DC

## 2020-04-24 MED ORDER — ACETAMINOPHEN 325 MG PO TABS: 650.0000 mg | ORAL_TABLET | Freq: Four times a day (QID) | ORAL | Status: DC | PRN

## 2020-04-24 MED ORDER — ENOXAPARIN SODIUM 40 MG/0.4ML ~~LOC~~ SOLN
40.0000 mg | SUBCUTANEOUS | Status: DC
  Administered 2020-04-24: 40 mg via SUBCUTANEOUS
  Filled 2020-04-24: qty 0.4

## 2020-04-24 MED ORDER — ASPIRIN 81 MG PO CHEW
81.0000 mg | CHEWABLE_TABLET | Freq: Every day | ORAL | Status: DC
  Administered 2020-04-24: 81 mg via ORAL
  Filled 2020-04-24: qty 1

## 2020-04-24 MED ORDER — ROSUVASTATIN CALCIUM 20 MG PO TABS
20.0000 mg | ORAL_TABLET | Freq: Every day | ORAL | Status: DC
  Administered 2020-04-24: 20 mg via ORAL
  Filled 2020-04-24: qty 1

## 2020-04-24 MED ORDER — STROKE: EARLY STAGES OF RECOVERY BOOK
Freq: Once | Status: AC
  Filled 2020-04-24: qty 1

## 2020-04-24 NOTE — Progress Notes (Addendum)
Occupational Therapy Evaluation Patient Details Name: Melinda Mckay MRN: [id number] DOB: 1945-04-04 Today's Date: 04/24/2020    History of Present Illness 75 y.o. female with medical history significant for prediabetes, asthma, hypertension, hyperlipidemia, generalized anxiety disorder, who is admitted to [facility] [address] on 04/23/20 by way of transfer from [facility] ED after presenting from home to the latter facility complaining of right-sided numbness. MRI - No definite acute infarction. There could be a punctate acute  infarction along a gyral surface at the deep insula on the right.   Clinical Impression   PTA pt lives independently at home alone. Pt is at baseline regarding ADL and mobility. Symptoms on admission have resolved per pt. Pt demonstrates apparent Carpal Tunnel syndrome R hand and would benefit from outptOT. Educated pt on use of wrist splint for night time wear and as needed during the day, use of ice/rest and Median nerve gliding techniques. Educated pt on signs/symptoms of CVA using [employer]. Written information provided. Pt can discuss setting her up with outpt OT/hand specialist in [place] after DC. OT signing off.     Follow Up Recommendations  Outpatient OT;Supervision - Intermittent    Equipment Recommendations  None recommended by OT (for Carpal Tunnel)    Recommendations for Other Services       Precautions / Restrictions Precautions Precautions: None      Mobility Bed Mobility Independent                    Transfers Overall transfer level: Independent                    Balance Overall balance assessment: No apparent balance deficits (not formally assessed)                                         ADL either performed or assessed with clinical judgement   ADL Overall ADL's : At baseline                                             Vision Baseline Vision/History: No visual  deficits       Perception     Praxis      Pertinent Vitals/Pain Pain Assessment: No/denies pain     Hand Dominance Right   Extremity/Trunk Assessment Upper Extremity Assessment Upper Extremity Assessment: Overall WFL for tasks assessed;RUE deficits/detail RUE Deficits / Details: R carpal; tunnel  - 2-3 months RUE Coordination: WNL   Lower Extremity Assessment Lower Extremity Assessment: Overall WFL for tasks assessed   Cervical / Trunk Assessment Cervical / Trunk Assessment: Normal   Communication Communication Communication: No difficulties   Cognition Arousal/Alertness: Awake/alert Behavior During Therapy: WFL for tasks assessed/performed Overall Cognitive Status: Within Functional Limits for tasks assessed                                     General Comments  Pt has a wrist splint    Exercises Exercises: Other exercises Other Exercises Other Exercises: Median nerve glides   Shoulder Instructions      Home Living Family/patient expects to be discharged to:: Private residence Living Arrangements: Alone Available Help at Discharge: Family;Available PRN/intermittently  Type of Home:  (townhouse) Home Access: Level entry     Home Layout: Two level;Able to live on main level with bedroom/bathroom     Bathroom Shower/Tub: Occupational psychologist: Handicapped height Bathroom Accessibility: Yes How Accessible: Accessible via walker Home Equipment: [place] - 2 wheels;Bedside commode;Toilet riser          Prior Functioning/Environment Level of Independence: Independent        Comments: works as Programme researcher, broadcasting/film/video List: Impaired sensation      OT Treatment/Interventions:      OT Goals(Current goals can be found in the care plan section) Acute Rehab OT Goals Patient Stated Goal: return to work/go home OT Goal Formulation: All assessment and education complete, DC therapy  OT Frequency:     Barriers to D/C:             Co-evaluation              AM-PAC OT "6 Clicks" Daily Activity     Outcome Measure Help from another person eating meals?: None Help from another person taking care of personal grooming?: None Help from another person toileting, which includes using toliet, bedpan, or urinal?: None Help from another person bathing (including washing, rinsing, drying)?: None Help from another person to put on and taking off regular upper body clothing?: None Help from another person to put on and taking off regular lower body clothing?: None 6 Click Score: 24   End of Session    Activity Tolerance: Patient tolerated treatment well Patient left:    OT Visit Diagnosis: Muscle weakness (generalized) (M62.81)                Time: 9201-0071 OT Time Calculation (min): 30 min Charges:  OT General Charges $OT Visit: 1 Visit OT Evaluation $OT Eval Low Complexity: 1 Low OT Treatments $Therapeutic Exercise: 8-22 mins  Maurie Boettcher, OT/L   Acute OT Clinical Specialist Acute Rehabilitation Services Pager [phone] Office [phone]   [facility] 04/24/2020, 8:46 AM

## 2020-04-24 NOTE — Progress Notes (Signed)
Patient discharging home. Vital signs stable at time of discharge as reflected in discharge summary. Discharge instructions given and verbal understanding returned. Patient discharging with stroke information booklet. Patient to follow up with PCP within a week and neurology within 3 weeks.

## 2020-04-24 NOTE — CV Procedure (Signed)
Carotid duplex completed.  Results can be found under chart review under CV PROC. 04/24/2020 5:16 PM Carlen Rebuck RVT, RDMS

## 2020-04-24 NOTE — Progress Notes (Signed)
  Echocardiogram 2D Echocardiogram has been performed.  Melinda Mckay 04/24/2020, 10:51 AM

## 2020-04-24 NOTE — Discharge Instructions (Signed)
Follow with Primary MD Latanya Presser B, MD in 7 days, follow pending Echocardiogram results.  Get CBC, CMP, 2 view Chest X ray -  checked next visit within 1 week by Primary MD    Activity: As tolerated with Full fall precautions use walker/cane & assistance as needed  Disposition Home   Diet: Heart Healthy Low Carb  Special Instructions: If you have smoked or chewed Tobacco  in the last 2 yrs please stop smoking, stop any regular Alcohol  and or any Recreational drug use.  On your next visit with your primary care physician please Get Medicines reviewed and adjusted.  Please request your Prim.MD to go over all Hospital Tests and Procedure/Radiological results at the follow up, please get all Hospital records sent to your Prim MD by signing hospital release before you go home.  If you experience worsening of your admission symptoms, develop shortness of breath, life threatening emergency, suicidal or homicidal thoughts you must seek medical attention immediately by calling 911 or calling your MD immediately  if symptoms less severe.  You Must read complete instructions/literature along with all the possible adverse reactions/side effects for all the Medicines you take and that have been prescribed to you. Take any new Medicines after you have completely understood and accpet all the possible adverse reactions/side effects.      Person Under Monitoring Name: Melinda Mckay  Location: [place] [address]   Infection Prevention Recommendations for Individuals Confirmed to have, or Being Evaluated for, 2019 Novel Coronavirus (COVID-19) Infection Who Receive Care at Home  Individuals who are confirmed to have, or are being evaluated for, COVID-19 should follow the prevention steps below until a healthcare provider or local or state health department says they can return to normal activities.  Stay home except to get medical care You should restrict  activities outside your home, except for getting medical care. Do not go to work, school, or public areas, and do not use public transportation or taxis.  Call ahead before visiting your doctor Before your medical appointment, call the healthcare provider and tell them that you have, or are being evaluated for, COVID-19 infection. This will help the healthcare provider's office take steps to keep other people from getting infected. Ask your healthcare provider to call the local or state health department.  Monitor your symptoms Seek prompt medical attention if your illness is worsening (e.g., difficulty breathing). Before going to your medical appointment, call the healthcare provider and tell them that you have, or are being evaluated for, COVID-19 infection. Ask your healthcare provider to call the local or state health department.  Wear a facemask You should wear a facemask that covers your nose and mouth when you are in the same room with other people and when you visit a healthcare provider. People who live with or visit you should also wear a facemask while they are in the same room with you.  Separate yourself from other people in your home As much as possible, you should stay in a different room from other people in your home. Also, you should use a separate bathroom, if available.  Avoid sharing household items You should not share dishes, drinking glasses, cups, eating utensils, towels, bedding, or other items with other people in your home. After using these items, you should wash them thoroughly with soap and water.  Cover your coughs and sneezes Cover your mouth and nose with a tissue when you cough or sneeze, or you  can cough or sneeze into your sleeve. Throw used tissues in a lined trash can, and immediately wash your hands with soap and water for at least 20 seconds or use an alcohol-based hand rub.  Wash your [facility] your hands often and thoroughly with soap and  water for at least 20 seconds. You can use an alcohol-based hand sanitizer if soap and water are not available and if your hands are not visibly dirty. Avoid touching your eyes, nose, and mouth with unwashed hands.   Prevention Steps for Caregivers and Household Members of Individuals Confirmed to have, or Being Evaluated for, COVID-19 Infection Being Cared for in the Home  If you live with, or provide care at home for, a person confirmed to have, or being evaluated for, COVID-19 infection please follow these guidelines to prevent infection:  Follow healthcare provider's instructions Make sure that you understand and can help the patient follow any healthcare provider instructions for all care.  Provide for the patient's basic needs You should help the patient with basic needs in the home and provide support for getting groceries, prescriptions, and other personal needs.  Monitor the patient's symptoms If they are getting sicker, call his or her medical provider and tell them that the patient has, or is being evaluated for, COVID-19 infection. This will help the healthcare provider's office take steps to keep other people from getting infected. Ask the healthcare provider to call the local or state health department.  Limit the number of people who have contact with the patient  If possible, have only one caregiver for the patient.  Other household members should stay in another home or place of residence. If this is not possible, they should stay  in another room, or be separated from the patient as much as possible. Use a separate bathroom, if available.  Restrict visitors who do not have an essential need to be in the home.  Keep older adults, very young children, and other sick people away from the patient Keep older adults, very young children, and those who have compromised immune systems or chronic health conditions away from the patient. This includes people with chronic  heart, lung, or kidney conditions, diabetes, and cancer.  Ensure good ventilation Make sure that shared spaces in the home have good air flow, such as from an air conditioner or an opened window, weather permitting.  Wash your hands often  Wash your hands often and thoroughly with soap and water for at least 20 seconds. You can use an alcohol based hand sanitizer if soap and water are not available and if your hands are not visibly dirty.  Avoid touching your eyes, nose, and mouth with unwashed hands.  Use disposable paper towels to dry your hands. If not available, use dedicated cloth towels and replace them when they become wet.  Wear a facemask and gloves  Wear a disposable facemask at all times in the room and gloves when you touch or have contact with the patient's blood, body fluids, and/or secretions or excretions, such as sweat, saliva, sputum, nasal mucus, vomit, urine, or feces.  Ensure the mask fits over your nose and mouth tightly, and do not touch it during use.  Throw out disposable facemasks and gloves after using them. Do not reuse.  Wash your hands immediately after removing your facemask and gloves.  If your personal clothing becomes contaminated, carefully remove clothing and launder. Wash your hands after handling contaminated clothing.  Place all used disposable facemasks, gloves,  and other waste in a lined container before disposing them with other household waste.  Remove gloves and wash your hands immediately after handling these items.  Do not share dishes, glasses, or other household items with the patient  Avoid sharing household items. You should not share dishes, drinking glasses, cups, eating utensils, towels, bedding, or other items with a patient who is confirmed to have, or being evaluated for, COVID-19 infection.  After the person uses these items, you should wash them thoroughly with soap and water.  Wash laundry thoroughly  Immediately remove  and wash clothes or bedding that have blood, body fluids, and/or secretions or excretions, such as sweat, saliva, sputum, nasal mucus, vomit, urine, or feces, on them.  Wear gloves when handling laundry from the patient.  Read and follow directions on labels of laundry or clothing items and detergent. In general, wash and dry with the warmest temperatures recommended on the label.  Clean all areas the individual has used often  Clean all touchable surfaces, such as counters, tabletops, doorknobs, bathroom fixtures, toilets, phones, keyboards, tablets, and bedside tables, every day. Also, clean any surfaces that may have blood, body fluids, and/or secretions or excretions on them.  Wear gloves when cleaning surfaces the patient has come in contact with.  Use a diluted bleach solution (e.g., dilute bleach with 1 part bleach and 10 parts water) or a household disinfectant with a label that says EPA-registered for coronaviruses. To make a bleach solution at home, add 1 tablespoon of bleach to 1 quart (4 cups) of water. For a larger supply, add  cup of bleach to 1 gallon (16 cups) of water.  Read labels of cleaning products and follow recommendations provided on product labels. Labels contain instructions for safe and effective use of the cleaning product including precautions you should take when applying the product, such as wearing gloves or eye protection and making sure you have good ventilation during use of the product.  Remove gloves and wash hands immediately after cleaning.  Monitor yourself for signs and symptoms of illness Caregivers and household members are considered close contacts, should monitor their health, and will be asked to limit movement outside of the home to the extent possible. Follow the monitoring steps for close contacts listed on the symptom monitoring form.   ? If you have additional questions, contact your local health department or call the epidemiologist on call  at [phone] (available 24/7). ? This guidance is subject to change. For the most up-to-date guidance from [facility], please refer to their website: [email or web address]

## 2020-04-24 NOTE — Consult Note (Signed)
Neurology Consultation  Reason for Consult: right sided numbness Referring Physician: P. Candiss Norse, MD  CC: TIA  History is obtained from: patient  HPI: Melinda Mckay is a 75 y.o. female with a PMHx of HLD, HTN, prediabetes, asthma, and anxiety. Patient was in a store yesterday around 1430 hrs and began to feel numbness from face to toes on the right side only. She continued shopping for a couple of minutes. She went out to her car and sat, and by 5 minutes, her symptoms resolved. She went ahead and proceeded to [facility] She had a CT head which was negative for acute issue and was sent to [facility] for stroke workup.   MRI brain showed a possible acute punctate right gyral surface at deep insula on the right and moderate small vessel ischemic disease.This is equivocal and would not seemingly result in the presenting symptoms even if real.   HbA1c was 6.3% and LDL 104. TSH not available.   No personal hx of TIA or stoke and no FMHx as well. Patient used to smoke but quit in the 90s.   LKW: 1430 on 04/23/2020  ROS: A 14 point ROS was performed and is negative except as noted in the HPI.   Past Medical History:  Diagnosis Date  . Anxiety   . Asthma   . Edema of lower extremity   . Hyperlipidemia   . Hypertension   . Prediabetes   . Restless legs     Family History  Problem Relation Age of Onset  . Cancer Sister        KIDNEY   . Allergies Sister   . Asthma Sister   . Allergies Brother   . Diabetes Brother   . Hypertension Brother   . Asthma Brother    Social History:   reports that she quit smoking about 27 years ago. Her smoking use included cigarettes. She has a 8.00 pack-year smoking history. She has never used smokeless tobacco. She reports that she does not drink alcohol and does not use drugs.  Medications  Current Facility-Administered Medications:  .  acetaminophen (TYLENOL) tablet 650 mg, 650 mg, Oral, Q6H PRN **OR** [DISCONTINUED] acetaminophen (TYLENOL) suppository 650  mg, 650 mg, Rectal, Q6H PRN, Howerter, Justin B, DO .  albuterol (VENTOLIN HFA) 108 (90 Base) MCG/ACT inhaler 1-2 puff, 1-2 puff, Inhalation, Q4H PRN, Howerter, Justin B, DO .  aspirin chewable tablet 81 mg, 81 mg, Oral, Daily, Howerter, Justin B, DO .  enoxaparin (LOVENOX) injection 40 mg, 40 mg, Subcutaneous, Q24H, Howerter, Justin B, DO .  hydrOXYzine (ATARAX/VISTARIL) tablet 25 mg, 25 mg, Oral, TID PRN, Howerter, Justin B, DO .  rosuvastatin (CRESTOR) tablet 20 mg, 20 mg, Oral, QHS, Thurnell Lose, MD   Exam: Current vital signs: BP 140/80 (BP Location: Right Arm)   Pulse 60   Temp 97.9 F (36.6 C) (Oral)   Resp 18   Ht 5\' 5"  (1.651 m)   Wt 82 kg   SpO2 93%   BMI 30.08 kg/m  Vital signs in last 24 hours: Temp:  [97.7 F (36.5 C)-98.7 F (37.1 C)] 97.9 F (36.6 C) (02/14 0740) Pulse Rate:  [59-74] 60 (02/14 0740) Resp:  [14-19] 18 (02/14 0740) BP: (140-161)/(69-99) 140/80 (02/14 0740) SpO2:  [93 %-100 %] 93 % (02/14 0740) Weight:  [81.6 kg-82 kg] 82 kg (02/14 0101)  GENERAL: Awake, alert in NAD HEENT: - Normocephalic and atraumatic, dry mm, no LN++, no Thyromegaly LUNGS - Normal respiratory effort.  CV -  RRR on tele ABDOMEN - Soft, nontender Ext: warm, well perfused Psych: pleasant, calm, affect appropriate  NEURO:  Mental Status: AA&Ox3  Speech/Language: speech is without dysarthria.  Naming, repetition, fluency, and comprehension intact. Cranial Nerves:  II: PERRL 58mm/brisk. visual fields full.  III, IV, VI: EOMI. Lid elevation symmetric and full.  V: sensation is intact and symmetrical to face. Moves jaw back and forth.  VII: Smile is symmetrical. Able to puff cheeks and raise eyebrows.  VIII:hearing intact to voice IX, X: palate elevation is symmetric. Phonation normal.  XI: normal sternocleidomastoid and trapezius muscle strength JSE:GBTDVV is symmetrical without fasciculations.   Motor: 5/5 strength is all muscle groups.  Tone is normal. Bulk is normal.   Sensation- Intact to light touch bilaterally in all four extremities. Extinction is normal.  Coordination: FTN intact bilaterally. HKS intact bilaterally. No pronator drift.  DTRs: 2+ throughout.  Gait- deferred  1a Level of Conscious.: 0 1b LOC Questions: 0 1c LOC Commands: 0 2 Best Gaze: 0 3 Visual: 0 4 Facial Palsy: 0 5a Motor Arm - left: 0 5b Motor Arm - Right: 0 6a Motor Leg - Left: 0 6b Motor Leg - Right: 0 7 Limb Ataxia: 0 8 Sensory: 0 9 Best Language: 0 10 Dysarthria: 0 11 Extinct. and Inatten: 0 TOTAL: 0   Labs I have reviewed labs in epic and the results pertinent to this consultation are: HbA1c 6.3    LDL 104    INR 1  CBC    Component Value Date/Time   WBC 5.6 04/24/2020 0410   RBC 4.43 04/24/2020 0410   HGB 12.7 04/24/2020 0410   HCT 38.9 04/24/2020 0410   PLT 250 04/24/2020 0410   MCV 87.8 04/24/2020 0410   MCH 28.7 04/24/2020 0410   MCHC 32.6 04/24/2020 0410   RDW 13.8 04/24/2020 0410   LYMPHSABS 2.2 04/23/2020 1538   MONOABS 0.6 04/23/2020 1538   EOSABS 0.3 04/23/2020 1538   BASOSABS 0.1 04/23/2020 1538    CMP     Component Value Date/Time   NA 141 04/24/2020 0410   K 3.8 04/24/2020 0410   CL 104 04/24/2020 0410   CO2 28 04/24/2020 0410   GLUCOSE 113 (H) 04/24/2020 0410   BUN 16 04/24/2020 0410   CREATININE 0.72 04/24/2020 0410   CALCIUM 9.7 04/24/2020 0410   PROT 6.8 04/24/2020 0410   ALBUMIN 3.1 (L) 04/24/2020 0410   AST 19 04/24/2020 0410   ALT 15 04/24/2020 0410   ALKPHOS 54 04/24/2020 0410   BILITOT 0.6 04/24/2020 0410   GFRNONAA >60 04/24/2020 0410   GFRAA >60 02/28/2017 1340    Lipid Panel     Component Value Date/Time   CHOL 171 04/24/2020 0410   TRIG 55 04/24/2020 0410   HDL 56 04/24/2020 0410   CHOLHDL 3.1 04/24/2020 0410   VLDL 11 04/24/2020 0410   LDLCALC 104 (H) 04/24/2020 0410     Imaging MD reviewed the images obtained  CT head neg for acute findings.   MRI brain  1. Question punctate acute infarction  along a gyral surface at the deep insula on the right.  2. Moderate small-vessel ischemic changes elsewhere throughout the brain as outlined above.   Assessment: 75 yo female who presented to [facility] after experiencing right sided numbness. Symptoms resolved in 5 minutes and have not recurred. Findings are most consistent with TIA. The questionable infarct on MRI brain is not consistent with exam findings. In review of chart, she does not seem  to be positive for results that would be infectious.   Impression: TIA  Recommendations: - MRA neck and brain -TSH - Agree with Increase of Rosuvastatin dose. - PT/OT/ST  - stroke education - Frequent neuro checks - follow NIHSS - Echocardiogram - Prophylactic therapy-ASA 81 mg. Plavix 75mg  po qd x 21 days.  - Risk factor modification - cardiac telemetry monitoring for arrhythmia - Stroke team to follow  PPE worn: gown, bonnett, gloves, shoe covers, N95  Pt seen by Clance Boll, NP/Neuro  Pager: [phone]  Attestation:  I saw this patient with the APP on 04/24/20, obtained pertinent aspects of the history, and performed relevant physical and neurological examination as documented. Also, I reviewed the available laboratory data and neuroimages, and other relevant tests/notes/procedures.  My examination findings include normal examination findings with NIHSS = 0 and no deficits.  Impression: Small stroke, cryptogenic etiology.  Several stroke risk factors including HLD, HTN and "pre" diabetes.  Recommendations: ASA/statin Workup as outlined. DAPT x 21 days.  Thank you.  Perfecto Kingdom, MD

## 2020-04-24 NOTE — Progress Notes (Signed)
PT Cancellation Note  Patient Details Name: JORDYN HOFACKER MRN: [id number] DOB: 04-Feb-1946   Cancelled Treatment:    Reason Eval/Treat Not Completed: PT screened, no needs identified, will sign off (see OT Evaluation 04/24/20). Please reconsult if new needs arise.  Mabeline Caras, PT, DPT Acute Rehabilitation Services  Pager [phone] Office [place] 04/24/2020, 9:10 AM

## 2020-04-24 NOTE — Discharge Summary (Signed)
Melinda Mckay DTO:[id number] DOB: 1945-12-13 DOA: 04/23/2020  PCP: Audley Hose, MD  Admit date: 04/23/2020  Discharge date: 04/24/2020  Admitted From: Home   Disposition:  Home   Recommendations for Outpatient Follow-up:   Follow up with PCP in 1-2 weeks  PCP Please obtain BMP/CBC, 2 view CXR in 1week,  (see Discharge instructions)   PCP Please follow up on the following pending results: Monitor secondary risk factors for stroke closely, needs outpatient neuro follow-up in 3 to 4 weeks 1 time post discharge.   Home Health: None   Equipment/Devices: None  Consultations: Neuro Discharge Condition: Stable    CODE STATUS: Full    Diet Recommendation: Heart Healthy Low Carb  Diet Order            Diet regular Room service appropriate? Yes; Fluid consistency: Thin  Diet effective now                  Chief Complaint  Patient presents with  . Weakness     Brief history of present illness from the day of admission and additional interim summary     Melinda Mckay is a 75 y.o. female with medical history significant for prediabetes, asthma, hypertension, hyperlipidemia, generalized anxiety disorder, who is admitted to [facility] on 04/23/20 by way of transfer from [facility] ED after presenting from home to the latter facility complaining of right-sided numbness.                                                                 Hospital Course   1.  TIA.  All symptoms resolved within 5 minutes before she came to the ER, remained symptom-free, seen by neurology Case discussed with neurologist Dr. Charlsie Merles, continue aspirin, statin dose doubled for better control, prediabetic.  Outpatient follow-up with PCP for secondary risk factors monitoring, one-time outpatient follow-up with  neurology post discharge.   2. Incidental COVID-19 infection.  No treatment no symptoms.  SpO2: 94 %  Recent Labs  Lab 04/23/20 1538 04/24/20 0410  WBC 6.5 5.6  CRP  --  2.2*  DDIMER  --  0.37  AST 21 19  ALT 20 15  ALKPHOS 62 54  BILITOT 0.4 0.6  ALBUMIN 3.8 3.1*  INR 1.0  --   SARSCOV2NAA POSITIVE*  --      3.  Dyslipidemia.  Statin dose doubled for better control.  LDL was greater than 100.   Problems of hypertension, generalized anxiety disorder are stable Home medications continued without any changes.    Discharge diagnosis     Principal Problem:   TIA (transient ischemic attack) Active Problems:   Anxiety disorder   Essential hypertension   Hyperlipidemia   Hypokalemia   Lab test positive for detection of COVID-19  virus    Discharge instructions    Discharge Instructions    Discharge instructions   Complete by: As directed    Follow with Primary MD Latanya Presser B, MD in 7 days, follow pending Echocardiogram results.   Get CBC, CMP, 2 view Chest X ray -  checked next visit within 1 week by Primary MD    Activity: As tolerated with Full fall precautions use walker/cane & assistance as needed  Disposition Home   Diet: Heart Healthy Low Carb  Special Instructions: If you have smoked or chewed Tobacco  in the last 2 yrs please stop smoking, stop any regular Alcohol  and or any Recreational drug use.  On your next visit with your primary care physician please Get Medicines reviewed and adjusted.  Please request your Prim.MD to go over all Hospital Tests and Procedure/Radiological results at the follow up, please get all Hospital records sent to your Prim MD by signing hospital release before you go home.  If you experience worsening of your admission symptoms, develop shortness of breath, life threatening emergency, suicidal or homicidal thoughts you must seek medical attention immediately by calling 911 or calling your MD immediately  if symptoms  less severe.  You Must read complete instructions/literature along with all the possible adverse reactions/side effects for all the Medicines you take and that have been prescribed to you. Take any new Medicines after you have completely understood and accpet all the possible adverse reactions/side effects.   Increase activity slowly   Complete by: As directed       Discharge Medications   Allergies as of 04/24/2020   No Known Allergies     Medication List    TAKE these medications   acetaminophen 500 MG tablet Commonly known as: TYLENOL Take 500 mg by mouth every 6 (six) hours as needed for mild pain.   albuterol 108 (90 Base) MCG/ACT inhaler Commonly known as: ProAir HFA 2 puffs every 4 hours as needed only  if your can't catch your breath What changed:   how much to take  how to take this  when to take this  reasons to take this  additional instructions   ALPRAZolam 1 MG tablet Commonly known as: XANAX Take 1 mg by mouth 2 (two) times daily as needed for anxiety.   amLODipine 10 MG tablet Commonly known as: NORVASC Take 5 mg by mouth daily.   aspirin 81 MG chewable tablet Chew 81 mg by mouth daily.   calcium-vitamin D 500-200 MG-UNIT tablet Commonly known as: OSCAL WITH D Take 1 tablet by mouth daily with breakfast.   cholecalciferol 25 MCG (1000 UNIT) tablet Commonly known as: VITAMIN D3 Take 1,000 Units by mouth daily.   esomeprazole 40 MG capsule Commonly known as: NEXIUM Take 40 mg by mouth daily.   Fish Oil 1000 MG Caps Take 1,000 mg by mouth daily.   hydrochlorothiazide 25 MG tablet Commonly known as: HYDRODIURIL Take 25 mg by mouth daily.   hydrOXYzine 25 MG tablet Commonly known as: ATARAX/VISTARIL Take 25 mg by mouth at bedtime as needed for itching.   losartan 100 MG tablet Commonly known as: COZAAR Take 100 mg by mouth daily.   Myrbetriq 25 MG Tb24 tablet Generic drug: mirabegron ER Take 25 mg by mouth every morning.    potassium chloride SA 20 MEQ tablet Commonly known as: KLOR-CON Take 20 mEq by mouth daily.   Qvar RediHaler 80 MCG/ACT inhaler Generic drug: beclomethasone Inhale 2 puffs into the lungs  daily.   rosuvastatin 20 MG tablet Commonly known as: CRESTOR Take 1 tablet (20 mg total) by mouth daily. What changed: how much to take        Follow-up Information    Bakare, Mobolaji B, MD. Schedule an appointment as soon as possible for a visit in 1 week(s).   Specialty: Internal Medicine Contact information: [place] [address] [phone]        Garvin Fila, MD. Schedule an appointment as soon as possible for a visit in 3 week(s).   Specialties: Neurology, Radiology Why: TIA Contact information: [address] [place] [address] [phone]               Major procedures and Radiology Reports - PLEASE review detailed and final reports thoroughly  -       CT Head Wo Contrast  Result Date: 04/23/2020 CLINICAL DATA:  Dizziness and right sided weakness starting 1.5 hours ago; no h/o stroke; no headache; no vision changes EXAM: CT HEAD WITHOUT CONTRAST TECHNIQUE: Contiguous axial images were obtained from the base of the skull through the vertex without intravenous contrast. COMPARISON:  02/28/2017 FINDINGS: Brain: No evidence of acute infarction, hemorrhage, hydrocephalus, extra-axial collection or mass lesion/mass effect. Mild patchy white matter hypoattenuation consistent with chronic microvascular ischemic change. Incidental note made of an expanded empty sella, unchanged. Vascular: No hyperdense vessel or unexpected calcification. Skull: Normal. Negative for fracture or focal lesion. Sinuses/Orbits: Globes and orbits are unremarkable. Sinuses are clear. Other: None. IMPRESSION: 1. No acute intracranial abnormalities. 2. No change from the previous study. Electronically Signed   By: Lajean Manes M.D.   On: 04/23/2020 15:41    MR BRAIN WO CONTRAST  Result Date: 04/24/2020 CLINICAL DATA:  Transient ischemic attack. Dizziness and right-sided weakness. EXAM: MRI HEAD WITHOUT CONTRAST TECHNIQUE: Multiplanar, multiecho pulse sequences of the brain and surrounding structures were obtained without intravenous contrast. COMPARISON:  Head CT earlier tonight. FINDINGS: Brain: No definite acute infarction. There could be a punctate acute infarction along a gyral surface at the deep insula on the right. This is equivocal and minimal and would not seemingly result in the presenting symptoms even if real. Elsewhere, the brainstem and cerebellum are normal. Cerebral hemispheres show moderate small-vessel disease affecting the thalami, basal ganglia and hemispheric white matter. No cortical or large vessel territory infarction. No mass lesion, hemorrhage, hydrocephalus or extra-axial collection. Vascular: Major vessels at the base of the brain show flow. Skull and upper cervical spine: Negative Sinuses/Orbits: Clear/normal Other: None IMPRESSION: 1. Question punctate acute infarction along a gyral surface at the deep insula on the right. This is equivocal and would not seemingly result in the presenting symptoms even if real. 2. Moderate small-vessel ischemic changes elsewhere throughout the brain as outlined above. Electronically Signed   By: Nelson Chimes M.D.   On: 04/24/2020 03:24   ECHOCARDIOGRAM LIMITED BUBBLE STUDY  Result Date: 04/24/2020    ECHOCARDIOGRAM LIMITED REPORT   Patient Name:   ARMONIE METTLER Date of Exam: 04/24/2020 Medical Rec #:  [id number]         Height:       65.0 in Accession #:    [id number]        Weight:       180.8 lb Date of Birth:  [birth year]         BSA:          1.895 m Patient Age:    42  years          BP:           140/80 mmHg Patient Gender: F                 HR:           60 bpm. Exam Location:  Inpatient Procedure: Limited Echo, Cardiac Doppler and Color Doppler Indications:    TIA  History:        Patient  has no prior history of Echocardiogram examinations.                 Risk Factors:Hypertension, Dyslipidemia and Former Smoker.                 COVID-19.  Sonographer:    Clayton Lefort RDCS (AE) Referring Phys: [id number] Rhetta Mura  Sonographer Comments: COVID-19 IMPRESSIONS  1. Left ventricular ejection fraction, by estimation, is 65 to 70%. The left ventricle has hyperdynamic function. The left ventricle has no regional wall motion abnormalities. Left ventricular diastolic parameters are consistent with Grade I diastolic dysfunction (impaired relaxation).  2. Right ventricular systolic function is normal. The right ventricular size is normal. Tricuspid regurgitation signal is inadequate for assessing PA pressure.  3. The mitral valve is normal in structure. No evidence of mitral valve regurgitation. No evidence of mitral stenosis.  4. The aortic valve is tricuspid. Aortic valve regurgitation is trivial. No aortic stenosis is present.  5. The inferior vena cava is normal in size with greater than 50% respiratory variability, suggesting right atrial pressure of 3 mmHg. FINDINGS  Left Ventricle: Left ventricular ejection fraction, by estimation, is 65 to 70%. The left ventricle has hyperdynamic function. The left ventricle has no regional wall motion abnormalities. The left ventricular internal cavity size was normal in size. There is no left ventricular hypertrophy. Left ventricular diastolic parameters are consistent with Grade I diastolic dysfunction (impaired relaxation). Right Ventricle: The right ventricular size is normal. No increase in right ventricular wall thickness. Right ventricular systolic function is normal. Tricuspid regurgitation signal is inadequate for assessing PA pressure. Left Atrium: Left atrial size was normal in size. Right Atrium: Right atrial size was normal in size. Pericardium: There is no evidence of pericardial effusion. Mitral Valve: The mitral valve is normal in structure. No  evidence of mitral valve stenosis. Tricuspid Valve: The tricuspid valve is normal in structure. Tricuspid valve regurgitation is not demonstrated. Aortic Valve: The aortic valve is tricuspid. Aortic valve regurgitation is trivial. No aortic stenosis is present. Aortic valve mean gradient measures 6.0 mmHg. Aortic valve peak gradient measures 10.6 mmHg. Aortic valve area, by VTI measures 1.85 cm. Pulmonic Valve: The pulmonic valve was normal in structure. Pulmonic valve regurgitation is not visualized. Aorta: The aortic root is normal in size and structure. Venous: The inferior vena cava is normal in size with greater than 50% respiratory variability, suggesting right atrial pressure of 3 mmHg. IAS/Shunts: No atrial level shunt detected by color flow Doppler. LEFT VENTRICLE PLAX 2D LVIDd:         3.10 cm  Diastology LVIDs:         1.90 cm  LV e' medial:    5.55 cm/s LV PW:         1.20 cm  LV E/e' medial:  15.0 LV IVS:        1.20 cm  LV e' lateral:   9.14 cm/s LVOT diam:     1.80 cm  LV E/e' lateral: 9.1  LV SV:         58 LV SV Index:   31 LVOT Area:     2.54 cm  LEFT ATRIUM         Index LA diam:    3.20 cm 1.69 cm/m  AORTIC VALVE AV Area (Vmax):    1.94 cm AV Area (Vmean):   1.95 cm AV Area (VTI):     1.85 cm AV Vmax:           163.00 cm/s AV Vmean:          107.000 cm/s AV VTI:            0.314 m AV Peak Grad:      10.6 mmHg AV Mean Grad:      6.0 mmHg LVOT Vmax:         124.00 cm/s LVOT Vmean:        81.900 cm/s LVOT VTI:          0.228 m LVOT/AV VTI ratio: 0.73  AORTA Ao Root diam: 3.10 cm Ao Asc diam:  3.00 cm MITRAL VALVE MV Area (PHT): 2.33 cm     SHUNTS MV Decel Time: 325 msec     Systemic VTI:  0.23 m MV E velocity: 83.10 cm/s   Systemic Diam: 1.80 cm MV A velocity: 103.00 cm/s MV E/A ratio:  0.81 Loralie Champagne MD Electronically signed by Loralie Champagne MD Signature Date/Time: 04/24/2020/1:52:19 PM    Final    VAS US CAROTID (at [facility] and [facility] only)  Result Date: 04/24/2020 Carotid Arterial Duplex  Study Indications:       TIA and Right side numbness. Risk Factors:      Hypertension, hyperlipidemia, past history of smoking. Other Factors:     Pre-DM. Comparison Study:  No prior exams Performing Technologist: Rogelia Rohrer  Examination Guidelines: A complete evaluation includes B-mode imaging, spectral Doppler, color Doppler, and power Doppler as needed of all accessible portions of each vessel. Bilateral testing is considered an integral part of a complete examination. Limited examinations for reoccurring indications may be performed as noted.  Right Carotid Findings: +----------+--------+--------+--------+------------------+------------------+           PSV cm/sEDV cm/sStenosisPlaque DescriptionComments           +----------+--------+--------+--------+------------------+------------------+ CCA Prox  65      22                                intimal thickening +----------+--------+--------+--------+------------------+------------------+ CCA Distal45      16                                intimal thickening +----------+--------+--------+--------+------------------+------------------+ ICA Prox  44      18                                                   +----------+--------+--------+--------+------------------+------------------+ ICA Distal55      22                                                   +----------+--------+--------+--------+------------------+------------------+ ECA  70      10                                                   +----------+--------+--------+--------+------------------+------------------+ +----------+--------+-------+----------------+-------------------+           PSV cm/sEDV cmsDescribe        Arm Pressure (mmHG) +----------+--------+-------+----------------+-------------------+ [id number]      0      Multiphasic, WNL                    +----------+--------+-------+----------------+-------------------+  +---------+--------+--+--------+--+---------+ VertebralPSV cm/s54EDV cm/s22Antegrade +---------+--------+--+--------+--+---------+  Left Carotid Findings: +----------+--------+--------+--------+------------------+------------------+           PSV cm/sEDV cm/sStenosisPlaque DescriptionComments           +----------+--------+--------+--------+------------------+------------------+ CCA Prox  57      15                                intimal thickening +----------+--------+--------+--------+------------------+------------------+ CCA Distal54      19                                intimal thickening +----------+--------+--------+--------+------------------+------------------+ ICA Prox  48      18                                                   +----------+--------+--------+--------+------------------+------------------+ ICA Distal53      18                                                   +----------+--------+--------+--------+------------------+------------------+ ECA       46      7                                                    +----------+--------+--------+--------+------------------+------------------+ +----------+--------+--------+----------------+-------------------+           PSV cm/sEDV cm/sDescribe        Arm Pressure (mmHG) +----------+--------+--------+----------------+-------------------+ [id number]      0       Multiphasic, WNL                    +----------+--------+--------+----------------+-------------------+ +---------+--------+--+--------+--+---------+ VertebralPSV cm/s35EDV cm/s10Antegrade +---------+--------+--+--------+--+---------+   Summary: Right Carotid: The extracranial vessels were near-normal with only minimal wall                thickening or plaque. Left Carotid: The extracranial vessels were near-normal with only minimal wall               thickening or plaque. Vertebrals:  Bilateral vertebral arteries  demonstrate antegrade flow. Subclavians: Normal flow hemodynamics were seen in bilateral subclavian              arteries. *See table(s) above for measurements and observations.     Preliminary     Micro Results  Recent Results (from the past 240 hour(s))  Resp Panel by RT-PCR (Flu A&B, Covid) Nasopharyngeal Swab     Status: Abnormal   Collection Time: 04/23/20  3:38 PM   Specimen: Nasopharyngeal Swab; Nasopharyngeal(NP) swabs in vial transport medium  Result Value Ref Range Status   SARS Coronavirus 2 by RT PCR POSITIVE (A) NEGATIVE Final    Comment: RESULT CALLED TO, READ BACK BY AND VERIFIED WITH:  REED,CINDY RN @1709  04/23/20 [facility]ENS.CA (NOTE) SARS-CoV-2 target nucleic acids are DETECTED.  The SARS-CoV-2 RNA is generally detectable in upper respiratory specimens during the acute phase of infection. Positive results are indicative of the presence of the identified virus, but do not rule out bacterial infection or co-infection with other pathogens not detected by the test. Clinical correlation with patient history and other diagnostic information is necessary to determine patient infection status. The expected result is Negative.  Fact Sheet for Patients: EntrepreneurPulse.com.au  Fact Sheet for Healthcare Providers: IncredibleEmployment.be  This test is not yet approved or cleared by the [place] FDA and  has been authorized for detection and/or diagnosis of SARS-CoV-2 by FDA under an Emergency Use Authorization (EUA).  This EUA will remain in effect (meaning this test ca n be used) for the duration of  the COVID-19 declaration under Section 564(b)(1) of the Act, 21 U.S.C. section 360bbb-3(b)(1), unless the authorization is terminated or revoked sooner.     Influenza A by PCR NEGATIVE NEGATIVE Final   Influenza B by PCR NEGATIVE NEGATIVE Final    Comment: (NOTE) The Xpert Xpress SARS-CoV-2/FLU/RSV plus assay is intended as an  aid in the diagnosis of influenza from Nasopharyngeal swab specimens and should not be used as a sole basis for treatment. Nasal washings and aspirates are unacceptable for Xpert Xpress SARS-CoV-2/FLU/RSV testing.  Fact Sheet for Patients: EntrepreneurPulse.com.au  Fact Sheet for Healthcare Providers: IncredibleEmployment.be  This test is not yet approved or cleared by the [place] FDA and has been authorized for detection and/or diagnosis of SARS-CoV-2 by FDA under an Emergency Use Authorization (EUA). This EUA will remain in effect (meaning this test can be used) for the duration of the COVID-19 declaration under Section 564(b)(1) of the Act, 21 U.S.C. section 360bbb-3(b)(1), unless the authorization is terminated or revoked.  Performed at [facility], [address] [place], [place] [address]     Today   Subjective    Melinda Mckay today has no headache,no chest abdominal pain,no new weakness tingling or numbness, feels much better wants to go home today.     Objective   Blood pressure 117/81, pulse 66, temperature 98 F (36.7 C), temperature source Oral, resp. rate 12, height 5\' 5"  (1.651 m), weight 82 kg, SpO2 94 %.   Intake/Output Summary (Last 24 hours) at 04/24/2020 1723 Last data filed at 04/24/2020 1022 Gross per 24 hour  Intake 120 ml  Output --  Net 120 ml    Exam  Awake Alert, No new F.N deficits, Normal affect [place].AT,PERRAL Supple Neck,No JVD, No cervical lymphadenopathy appriciated.  Symmetrical Chest wall movement, Good air movement bilaterally, CTAB RRR,No Gallops,Rubs or new Murmurs, No Parasternal Heave +ve B.Sounds, Abd Soft, Non tender, No organomegaly appriciated, No rebound -guarding or rigidity. No Cyanosis, Clubbing or edema, No new Rash or bruise   Data Review   CBC w Diff:  Lab Results  Component Value Date   WBC 5.6 04/24/2020   HGB 12.7 04/24/2020   HCT 38.9 04/24/2020    PLT 250 04/24/2020  LYMPHOPCT 33 04/23/2020   MONOPCT 9 04/23/2020   EOSPCT 4 04/23/2020   BASOPCT 1 04/23/2020    CMP:  Lab Results  Component Value Date   NA 141 04/24/2020   K 3.8 04/24/2020   CL 104 04/24/2020   CO2 28 04/24/2020   BUN 16 04/24/2020   CREATININE 0.72 04/24/2020   PROT 6.8 04/24/2020   ALBUMIN 3.1 (L) 04/24/2020   BILITOT 0.6 04/24/2020   ALKPHOS 54 04/24/2020   AST 19 04/24/2020   ALT 15 04/24/2020  . Lab Results  Component Value Date   HGBA1C 6.3 (H) 04/24/2020   Lab Results  Component Value Date   CHOL 171 04/24/2020   HDL 56 04/24/2020   LDLCALC 104 (H) 04/24/2020   TRIG 55 04/24/2020   CHOLHDL 3.1 04/24/2020     Total Time in preparing paper work, data evaluation and todays exam - 47 minutes  Lala Lund M.D on 04/24/2020 at 5:23 PM  [facility]

## 2020-05-04 ENCOUNTER — Other Ambulatory Visit: Payer: Self-pay | Admitting: Internal Medicine

## 2020-05-04 ENCOUNTER — Ambulatory Visit
Admission: RE | Admit: 2020-05-04 | Discharge: 2020-05-04 | Disposition: A | Discharge status: Home / Self Care | Payer: BC Managed Care – PPO | Source: Ambulatory Visit | Attending: Internal Medicine | Admitting: Internal Medicine

## 2020-05-04 DIAGNOSIS — G458 Other transient cerebral ischemic attacks and related syndromes: Secondary | ICD-10-CM

## 2020-05-08 ENCOUNTER — Encounter: Payer: Self-pay | Admitting: Neurology

## 2020-05-08 ENCOUNTER — Telehealth: Payer: Self-pay | Admitting: Neurology

## 2020-05-08 ENCOUNTER — Ambulatory Visit (INDEPENDENT_AMBULATORY_CARE_PROVIDER_SITE_OTHER): Payer: BC Managed Care – PPO | Admitting: Neurology

## 2020-05-08 VITALS — BP 118/82 | HR 69 | Ht 65.0 in | Wt 177.0 lb

## 2020-05-08 DIAGNOSIS — R292 Abnormal reflex: Secondary | ICD-10-CM

## 2020-05-08 DIAGNOSIS — R0683 Snoring: Secondary | ICD-10-CM

## 2020-05-08 DIAGNOSIS — R531 Weakness: Secondary | ICD-10-CM | POA: Diagnosis not present

## 2020-05-08 DIAGNOSIS — I639 Cerebral infarction, unspecified: Secondary | ICD-10-CM

## 2020-05-08 MED ORDER — CLOPIDOGREL BISULFATE 75 MG PO TABS: 75.0000 mg | ORAL_TABLET | Freq: Every day | ORAL | 4 refills | Status: DC

## 2020-05-08 NOTE — Telephone Encounter (Signed)
Medicare/BCBS Josem Kaufmann: [id number] (exp. 05/08/20 to 11/03/20) order sent to GI. They will reach out to the patient to schedule.

## 2020-05-08 NOTE — Progress Notes (Addendum)
[facility]    Provider:  Dr Jaynee Eagles Requesting Provider: Thurnell Lose, MD Primary Care Provider:  Audley Hose, MD  CC:  TIA  HPI:  Melinda Mckay is a 75 y.o. female here as requested by Thurnell Lose, MD for TIA.  Past medical history prediabetes, asthma, hypertension, hyperlipidemia, anxiety who was admitted on February 13 after presenting right-sided numbness.  I reviewed: Inpatient notes: Patient presented to [facility] complaining of right-sided numbness, she was transferred to [facility], all symptoms resolved within 5 minutes before she came to the emergency room, she remained symptom-free, discussed with neurology, she was continued on aspirin, statin dose doubled for better control.  She was Covid positive.  LDL was greater than 100.  Echocardiogram and carotid duplex were both unremarkable (reviewed report).  I reviewed MRI of the brain images and agree: IMPRESSION: 1. Question punctate acute infarction along a gyral surface at the deep insula on the right. This is equivocal and would not seemingly result in the presenting symptoms even if real. 2. Moderate small-vessel ischemic changes elsewhere throughout the brain as outlined above.  She has had other episodes, had 2 last week, the same episode, the whole right-side of the body went completely numb, she never felt dizzy, numb sensation, February 13th, duration was 5 minutes, no slurred speech, right arm and right leg, no other symptoms. On the 21st and 22nd of February had repeat right arm numbness, lasted 30-45 seconds. She was on Aspirin prior and now on Plavix and aspirin. She snores, she works from home every day, no naps during the day, not excessively tired, doesn't wake up with headaches. She had an episode in 2018 of similar, with a workup back then. This is the first time since then that it happened. Left arm completely fine. Currently the symptoms are resolved.     Reviewed notes, labs and imaging from outside physicians, which showed:  ldl 104 hgba1c 6.3  Echo:   1. Left ventricular ejection fraction, by estimation, is 65 to 70%. The  left ventricle has hyperdynamic function. The left ventricle has no  regional wall motion abnormalities. Left ventricular diastolic parameters  are consistent with Grade I diastolic  dysfunction (impaired relaxation).  2. Right ventricular systolic function is normal. The right ventricular  size is normal. Tricuspid regurgitation signal is inadequate for assessing  PA pressure.  3. The mitral valve is normal in structure. No evidence of mitral valve  regurgitation. No evidence of mitral stenosis.  4. The aortic valve is tricuspid. Aortic valve regurgitation is trivial.  No aortic stenosis is present.  5. The inferior vena cava is normal in size with greater than 50%  respiratory variability, suggesting right atrial pressure of 3 mmHg.   US carotids:   Right Carotid: The extracranial vessels were near-normal with only minimal  wall         thickening or plaque.   Left Carotid: The extracranial vessels were near-normal with only minimal  wall        thickening or plaque.   Vertebrals: Bilateral vertebral arteries demonstrate antegrade flow.  Subclavians: Normal flow hemodynamics were seen in bilateral subclavian        arteries.   Review of Systems: Patient complains of symptoms per HPI as well as the following symptoms Stroke/TIA. Pertinent negatives and positives per HPI. All others negative.   Social History   Socioeconomic History  . Marital status: Widowed    Spouse name: Not  on file  . Number of children: 2  . Years of education: Not on file  . Highest education level: Bachelor's degree (e.g., BA, AB, BS)  Occupational History  . Occupation: RETIRED   Tobacco Use  . Smoking status: Former Smoker    Packs/day: 0.40    Years: 20.00    Pack years: 8.00     Types: Cigarettes    Quit date: 03/11/1993    Years since quitting: 27.1  . Smokeless tobacco: Never Used  . Tobacco comment: PT STATES ABOUT 1 PACK A WEEK   Vaping Use  . Vaping Use: Never used  Substance and Sexual Activity  . Alcohol use: No    Comment: quit 2015  . Drug use: No  . Sexual activity: Not on file  Other Topics Concern  . Not on file  Social History Narrative   Lives at home alone   Right handed   Drinks 1 cup of coffee daily   Social Determinants of Health   Financial Resource Strain: Not on file  Food Insecurity: Not on file  Transportation Needs: Not on file  Physical Activity: Not on file  Stress: Not on file  Social Connections: Not on file  Intimate Partner Violence: Not on file    Family History  Problem Relation Age of Onset  . Cancer Sister        KIDNEY   . Allergies Sister   . Asthma Sister   . Allergies Brother   . Diabetes Brother   . Hypertension Brother   . Asthma Brother     Past Medical History:  Diagnosis Date  . Anxiety   . Asthma   . Edema of lower extremity   . Hyperlipidemia   . Hypertension   . Prediabetes   . Restless legs     Patient Active Problem List   Diagnosis Date Noted  . Hypokalemia 04/24/2020  . Lab test positive for detection of COVID-19 virus 04/24/2020  . TIA (transient ischemic attack) 04/23/2020  . Diverticulosis of colon 01/16/2018  . Internal hemorrhoids 01/16/2018  . Tubular adenoma 01/16/2018  . Paresthesias 04/28/2017  . Anxiety disorder 11/05/2016  . Edema of lower extremity 11/05/2016  . Essential hypertension 11/05/2016  . Hyperlipidemia 11/05/2016  . Prediabetes 11/05/2016  . Restless legs 11/05/2016  . Mild persistent asthma in adult without complication 61/44/3154    Past Surgical History:  Procedure Laterality Date  . bone spur removal Right   . BUNIONECTOMY Bilateral 2005  . PARTIAL HYSTERECTOMY  2008  . TUBAL LIGATION  1982    Current Outpatient Medications  Medication  Sig Dispense Refill  . acetaminophen (TYLENOL) 500 MG tablet Take 500 mg by mouth every 6 (six) hours as needed for mild pain.    [name] albuterol (PROAIR HFA) 108 (90 Base) MCG/ACT inhaler 2 puffs every 4 hours as needed only  if your can't catch your breath (Patient taking differently: Inhale 2 puffs into the lungs every 4 (four) hours as needed for wheezing or shortness of breath.) 18 g 2  . ALPRAZolam (XANAX) 1 MG tablet Take 1 mg by mouth 2 (two) times daily as needed for anxiety.    [name] amLODipine (NORVASC) 10 MG tablet Take 5 mg by mouth daily.    [name] aspirin 81 MG chewable tablet Chew 81 mg by mouth daily.    . beclomethasone (QVAR REDIHALER) 80 MCG/ACT inhaler Inhale 2 puffs into the lungs daily. 10.6 g 11  . calcium-vitamin D (OSCAL WITH D) 500-200  MG-UNIT tablet Take 1 tablet by mouth daily with breakfast.    . cholecalciferol (VITAMIN D3) 25 MCG (1000 UNIT) tablet Take 1,000 Units by mouth daily.    . clopidogrel (PLAVIX) 75 MG tablet Take 1 tablet (75 mg total) by mouth daily. 90 tablet 4  . esomeprazole (NEXIUM) 40 MG capsule Take 40 mg by mouth daily.    . hydrochlorothiazide (HYDRODIURIL) 25 MG tablet Take 25 mg by mouth daily.    . hydrOXYzine (ATARAX/VISTARIL) 25 MG tablet Take 25 mg by mouth at bedtime as needed for itching.    . losartan (COZAAR) 100 MG tablet Take 100 mg by mouth daily.    . Omega-3 Fatty Acids (FISH OIL) 1000 MG CAPS Take 1,000 mg by mouth daily.    . potassium chloride SA (K-DUR,KLOR-CON) 20 MEQ tablet Take 20 mEq by mouth daily.    . rosuvastatin (CRESTOR) 20 MG tablet Take 1 tablet (20 mg total) by mouth daily. 30 tablet 0   No current facility-administered medications for this visit.    Allergies as of 05/08/2020  . (No Known Allergies)    Vitals: BP 118/82   Pulse 69   Ht 5\' 5"  (1.651 m)   Wt 177 lb (80.3 kg)   BMI 29.45 kg/m  Last Weight:  Wt Readings from Last 1 Encounters:  05/08/20 177 lb (80.3 kg)   Last Height:   Ht Readings from Last 1  Encounters:  05/08/20 5\' 5"  (1.651 m)     Physical exam: Exam: Gen: NAD, conversant, well nourised, obese, well groomed                     CV: RRR, no MRG. No Carotid Bruits. No peripheral edema, warm, nontender Eyes: Conjunctivae clear without exudates or hemorrhage  Neuro: Detailed Neurologic Exam  Speech:    Speech is normal; fluent and spontaneous with normal comprehension.  Cognition:    The patient is oriented to person, place, and time;     recent and remote memory intact;     language fluent;     normal attention, concentration,     fund of knowledge Cranial Nerves:    The pupils are equal, round, and reactive to light. The fundi are normal and spontaneous venous pulsations are present. Visual fields are full to finger confrontation. Extraocular movements are intact. Trigeminal sensation is intact and the muscles of mastication are normal. The face is symmetric. The palate elevates in the midline. Hearing intact. Voice is normal. Shoulder shrug is normal. The tongue has normal motion without fasciculations.   Coordination:    Normal finger to nose and heel to shin. Normal rapid alternating movements.   Gait:    Heel-toe and tandem gait are normal.   Motor Observation:    No asymmetry, no atrophy, and no involuntary movements noted. Tone:    Normal muscle tone.    Posture:    Posture is normal. normal erect    Strength: right-sided weakness arm and leg 4-4+/5.otherwise strength is V/V in the upper and lower limbs.      Sensation: intact to LT     Reflex Exam:  DTR's:    Right AJ 1+, left AJ 2+ (hx o sciatica), Deep tendon reflexes in the upper and lower extremities are otherwise brisk bilaterally.   Toes:    The toes are downgoing bilaterally.   Clonus:    Clonus is absent.    Assessment/Plan:  [age] year old who is still working  full time for the [employer]. Past medical history prediabetes, asthma, hypertension, hyperlipidemia, anxiety who was admitted on  February 13 after presenting right-sided numbness. Today she still has right-sided weakness, I believe she may have had a left-hemisphere stroke we can't see on MRI. Also, after review of imaging, appears she had an acute stroke in the right hemisphere that would not account for her symptoms on the right side but does raise the question of embolic phenomena. May also consider pathology in the cervical spine, brisk reflexes and imbalance with right-sided weakness.   Sleep evaluation/sleep study: acute stroke, snoring 30-day heart monitor for afib and if negative consider a loop recorder Repeat MRI brain and add MRA head,  MRI cervical spine: brisk/abnormal reflexes and imbalance Continue ASA and Plavix for 3 weeks then plavix alone She is on esomeprazole, discuss with pcp about changing to protonix as esomeprazole interacts with plavix   Orders Placed This Encounter  Procedures  . MR BRAIN WO CONTRAST  . MR ANGIO HEAD WO CONTRAST  . MR CERVICAL SPINE WO CONTRAST  . Ambulatory referral to Sleep Studies  . CARDIAC EVENT MONITOR   Meds ordered this encounter  Medications  . clopidogrel (PLAVIX) 75 MG tablet    Sig: Take 1 tablet (75 mg total) by mouth daily.    Dispense:  90 tablet    Refill:  4   I had a long d/w patient about her recent stroke, risk for recurrent stroke/TIAs, personally independently reviewed imaging studies and stroke evaluation results and answered questions.Continue ASA and Plavix for 3 weeks then Plavix alone for secondary stroke prevention and maintain strict control of hypertension with blood pressure goal below 130/90, diabetes with hemoglobin A1c goal below 6.5% and lipids with LDL cholesterol goal below 70 mg/dL. to eat a healthy diet with plenty of whole grains, lean meats, fruits and vegetables, exercise regularly and maintain ideal body weight .Followup in the future in 4 months or call earlier if necessary.  Cc: Thurnell Lose, MD,  Audley Hose,  MD  Sarina Ill, MD  [facility] [address] [place], [place] [address]  Phone [phone] Fax [phone]

## 2020-05-08 NOTE — Patient Instructions (Signed)
Sleep evaluation/sleep study: Dr. Brett Fairy - will call 30-day heart monitor for atrial fibrillation or other cardiac etiologies of stroke MRI of the brain and MRA of the head (Brain and arteries of the head) MRI cervical spine: this is the other place that could affect one side of your body Continue ASA and plavix for 3 weeks and then Plavix alone   Stroke Prevention Some medical conditions and behaviors are associated with a higher chance of having a stroke. You can help prevent a stroke by making nutrition, lifestyle, and other changes, including managing any medical conditions you may have. What nutrition changes can be made?  Eat healthy foods. You can do this by: ? Choosing foods high in fiber, such as fresh fruits and vegetables and whole grains. ? Eating at least 5 or more servings of fruits and vegetables a day. Try to fill half of your plate at each meal with fruits and vegetables. ? Choosing lean protein foods, such as lean cuts of meat, poultry without skin, fish, tofu, beans, and nuts. ? Eating low-fat dairy products. ? Avoiding foods that are high in salt (sodium). This can help lower blood pressure. ? Avoiding foods that have saturated fat, trans fat, and cholesterol. This can help prevent high cholesterol. ? Avoiding processed and premade foods.  Follow your health care provider's specific guidelines for losing weight, controlling high blood pressure (hypertension), lowering high cholesterol, and managing diabetes. These may include: ? Reducing your daily calorie intake. ? Limiting your daily sodium intake to 1,500 milligrams (mg). ? Using only healthy fats for cooking, such as olive oil, canola oil, or sunflower oil. ? Counting your daily carbohydrate intake.   What lifestyle changes can be made?  Maintain a healthy weight. Talk to your health care provider about your ideal weight.  Get at least 30 minutes of moderate physical activity at least 5 days a week. Moderate  activity includes brisk walking, biking, and swimming.  Do not use any products that contain nicotine or tobacco, such as cigarettes and e-cigarettes. If you need help quitting, ask your health care provider. It may also be helpful to avoid exposure to secondhand smoke.  Limit alcohol intake to no more than 1 drink a day for nonpregnant women and 2 drinks a day for men. One drink equals 12 oz of beer, 5 oz of wine, or 1 oz of hard liquor.  Stop any illegal drug use.  Avoid taking birth control pills. Talk to your health care provider about the risks of taking birth control pills if: ? You are over 17 years old. ? You smoke. ? You get migraines. ? You have ever had a blood clot. What other changes can be made?  Manage your cholesterol levels. ? Eating a healthy diet is important for preventing high cholesterol. If cholesterol cannot be managed through diet alone, you may also need to take medicines. ? Take any prescribed medicines to control your cholesterol as told by your health care provider.  Manage your diabetes. ? Eating a healthy diet and exercising regularly are important parts of managing your blood sugar. If your blood sugar cannot be managed through diet and exercise, you may need to take medicines. ? Take any prescribed medicines to control your diabetes as told by your health care provider.  Control your hypertension. ? To reduce your risk of stroke, try to keep your blood pressure below 130/80. ? Eating a healthy diet and exercising regularly are an important part of controlling your blood pressure.  If your blood pressure cannot be managed through diet and exercise, you may need to take medicines. ? Take any prescribed medicines to control hypertension as told by your health care provider. ? Ask your health care provider if you should monitor your blood pressure at home. ? Have your blood pressure checked every year, even if your blood pressure is normal. Blood pressure  increases with age and some medical conditions.  Get evaluated for sleep disorders (sleep apnea). Talk to your health care provider about getting a sleep evaluation if you snore a lot or have excessive sleepiness.  Take over-the-counter and prescription medicines only as told by your health care provider. Aspirin or blood thinners (antiplatelets or anticoagulants) may be recommended to reduce your risk of forming blood clots that can lead to stroke.  Make sure that any other medical conditions you have, such as atrial fibrillation or atherosclerosis, are managed. What are the warning signs of a stroke? The warning signs of a stroke can be easily remembered as BEFAST.  B is for balance. Signs include: ? Dizziness. ? Loss of balance or coordination. ? Sudden trouble walking.  E is for eyes. Signs include: ? A sudden change in vision. ? Trouble seeing.  F is for face. Signs include: ? Sudden weakness or numbness of the face. ? The face or eyelid drooping to one side.  A is for arms. Signs include: ? Sudden weakness or numbness of the arm, usually on one side of the body.  S is for speech. Signs include: ? Trouble speaking (aphasia). ? Trouble understanding.  T is for time. ? These symptoms may represent a serious problem that is an emergency. Do not wait to see if the symptoms will go away. Get medical help right away. Call your local emergency services (911 in the [place] Do not drive yourself to the hospital.  Other signs of stroke may include: ? A sudden, severe headache with no known cause. ? Nausea or vomiting. ? Seizure. Where to find more information For more information, visit:  American Stroke Association: www.strokeassociation.org  National Stroke Association: www.stroke.org Summary  You can prevent a stroke by eating healthy, exercising, not smoking, limiting alcohol intake, and managing any medical conditions you may have.  Do not use any products that contain  nicotine or tobacco, such as cigarettes and e-cigarettes. If you need help quitting, ask your health care provider. It may also be helpful to avoid exposure to secondhand smoke.  Remember BEFAST for warning signs of stroke. Get help right away if you or a loved one has any of these signs. This information is not intended to replace advice given to you by your health care provider. Make sure you discuss any questions you have with your health care provider. Document Revised: 02/07/2017 Document Reviewed: 04/02/2016 Elsevier Patient Education  2021 [employer].

## 2020-05-16 ENCOUNTER — Ambulatory Visit (INDEPENDENT_AMBULATORY_CARE_PROVIDER_SITE_OTHER): Payer: BC Managed Care – PPO

## 2020-05-16 DIAGNOSIS — I4891 Unspecified atrial fibrillation: Secondary | ICD-10-CM | POA: Diagnosis not present

## 2020-05-16 DIAGNOSIS — R531 Weakness: Secondary | ICD-10-CM

## 2020-05-16 DIAGNOSIS — I639 Cerebral infarction, unspecified: Secondary | ICD-10-CM

## 2020-05-21 ENCOUNTER — Ambulatory Visit
Admission: RE | Admit: 2020-05-21 | Discharge: 2020-05-21 | Disposition: A | Discharge status: Home / Self Care | Payer: BC Managed Care – PPO | Source: Ambulatory Visit | Attending: Neurology | Admitting: Neurology

## 2020-05-21 DIAGNOSIS — R531 Weakness: Secondary | ICD-10-CM

## 2020-05-21 DIAGNOSIS — I639 Cerebral infarction, unspecified: Secondary | ICD-10-CM

## 2020-05-21 DIAGNOSIS — R292 Abnormal reflex: Secondary | ICD-10-CM

## 2020-05-22 ENCOUNTER — Telehealth: Payer: Self-pay | Admitting: *Deleted

## 2020-05-22 ENCOUNTER — Other Ambulatory Visit: Payer: Self-pay | Admitting: Neurology

## 2020-05-22 DIAGNOSIS — M4802 Spinal stenosis, cervical region: Secondary | ICD-10-CM

## 2020-05-22 NOTE — Telephone Encounter (Signed)
Spoke with patient and discussed MRI c-spine results. Pt verbalized understanding. Her questions were answered. She is in agreement to the referral to [facility]. She verbalized appreciation for the call.

## 2020-05-22 NOTE — Telephone Encounter (Signed)
I placed it to Vertell Limber, thanks!

## 2020-05-22 NOTE — Telephone Encounter (Signed)
-----   Message from Melvenia Beam, MD sent at 05/22/2020 12:17 PM EDT ----- MRI cervical spine shows degenerative changes. At one level she has moderate stenosis. I would like her to see someone at [facility] for evaluation just to make sure she doesn't need surgery.

## 2020-05-23 NOTE — Telephone Encounter (Signed)
Great, thanks

## 2020-06-07 ENCOUNTER — Institutional Professional Consult (permissible substitution): Payer: BC Managed Care – PPO | Admitting: Neurology

## 2020-06-13 ENCOUNTER — Encounter: Payer: Self-pay | Admitting: Neurology

## 2020-06-13 ENCOUNTER — Ambulatory Visit (INDEPENDENT_AMBULATORY_CARE_PROVIDER_SITE_OTHER): Payer: BC Managed Care – PPO | Admitting: Neurology

## 2020-06-13 VITALS — BP 142/89 | HR 52 | Ht 65.5 in | Wt 176.0 lb

## 2020-06-13 DIAGNOSIS — R0683 Snoring: Secondary | ICD-10-CM | POA: Diagnosis not present

## 2020-06-13 DIAGNOSIS — G4730 Sleep apnea, unspecified: Secondary | ICD-10-CM

## 2020-06-13 DIAGNOSIS — I639 Cerebral infarction, unspecified: Secondary | ICD-10-CM

## 2020-06-13 DIAGNOSIS — J4520 Mild intermittent asthma, uncomplicated: Secondary | ICD-10-CM | POA: Diagnosis not present

## 2020-06-13 NOTE — Progress Notes (Signed)
SLEEP MEDICINE CLINIC    Provider:  Larey Seat, MD  Primary Care Physician:  Audley Hose, MD [place] [address]     Referring Provider: Audley Hose, [place],  [place] [address]          Chief Complaint according to patient   Patient presents with:    . New Patient (Initial Visit)           HISTORY OF PRESENT ILLNESS:  Melinda Mckay is a 75- year- old African- American female patient seen upon Dr Cathren Laine referral on 06/13/2020  for a sleep consultation=  Chief concern according to patient : " I am not sure I had a stroke, may be a TIA but now I am snoring. "   I have the pleasure of seeing Melinda Mckay today, a right -handed African- American female with a possible sleep disorder. She a medical history of Anxiety,  Adult onset - Asthma, Edema of lower extremity, Hyperlipidemia, Hypertension, Prediabetes, and Restless legs. Sleep relevant medical history: Nocturia 2-3 , Sleep walking at age 27-5 years. cervical spine DDD, PT, no deviated septum repair/ sinus surgery.    Family medical /sleep history: [place], [place], [place]- 2 sisters and a brother and herself have adult onset asthma- no  other family member on CPAP with OSA, insomnia, sleep walkers.    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  Family status is single , with adult children, and grandchildren.  The patient currently works part time/ full time.   Tobacco use: quit 30 years ago- ETOH use : rare- none ,  Caffeine intake in form of Coffee( 1 cup a day) . Regular exercise in form of walking .   Hobbies : tutoring.    Sleep habits are as follows: The patient's dinner time is between 5-7PM. The patient goes to bed at 9.30 PM and continues to sleep for 7 hours, wakes for 3 bathroom breaks.   The preferred sleep  position is lateral- leftside, with the support of 1-2 pillows- flat mattress. GERD has returned.  Dreams are reportedly frequent/vivid.  8 AM is the usual rise time. The patient wakes up spontaneously. She reports mostly  feeling refreshed or restored in AM, with no morning headaches, and residual fatigue.Naps are taken infrequently, lasting from 60 minutes and are more refreshing than nocturnal sleep.    [name] note : Lovely, pleasant  75 year old who is still working full time for the [employer]. Past medical history prediabetes, asthma, hypertension, hyperlipidemia, anxiety who was admitted on February 13 after presenting right-sided numbness. Today she still has right-sided weakness, I believe she may have had a left-hemisphere stroke we can't see on MRI. Also, after review of imaging, appears she had an acute stroke in the right hemisphere that would not account for her symptoms on the right side but does raise the question of embolic phenomena. May also consider pathology in the cervical spine, brisk reflexes and imbalance with right-sided weakness.   Sleep evaluation/sleep study: acute stroke, snoring 30-day heart monitor for afib and if negative consider a loop recorder- she is in her last week of recording.  Dr. Jaynee Eagles had on 05-23-2020 repeated MRI brain and add MRA head, MRI cervical spine: brisk/abnormal reflexes and imbalance Continue ASA and Plavix for  3 weeks then plavix alone She is on esomeprazole, discuss with pcp about changing to protonix as esomeprazole interacts with plavix I had a long d/w patient about her recent stroke, risk for recurrent stroke/TIAs, personally independently reviewed imaging studies and stroke evaluation results and answered questions.Continue ASA and Plavix for 3 weeks then Plavix alone for secondary stroke prevention and maintain strict control of hypertension with blood pressure goal below 130/90, diabetes with hemoglobin A1c goal below 6.5% and lipids with LDL  cholesterol goal below 70 mg/dL. to eat a healthy diet with plenty of whole grains, lean meats, fruits and vegetables, exercise regularly and maintain ideal body weight.     Review of Systems: Out of a complete 14 system review, the patient complains of only the following symptoms, and all other reviewed systems are negative.:   16 year old granddaughter stated she snored.  Fatigue, sleepiness .  Snoring, fragmented sleep, Insomnia.   How likely are you to doze in the following situations: 0 = not likely, 1 = slight chance, 2 = moderate chance, 3 = high chance   Sitting and Reading? Watching Television? Sitting inactive in a public place (theater or meeting)? As a passenger in a car for an hour without a break? Lying down in the afternoon when circumstances permit? Sitting and talking to someone? Sitting quietly after lunch without alcohol? In a car, while stopped for a few minutes in traffic?   Total = 8/ 24 points   FSS endorsed at 13/ 63 points.   Social History   Socioeconomic History  . Marital status: Widowed    Spouse name: Not on file  . Number of children: 2  . Years of education: Not on file  . Highest education level: Bachelor's degree (e.g., BA, AB, BS)  Occupational History  . Occupation: RETIRED   Tobacco Use  . Smoking status: Former Smoker    Packs/day: 0.40    Years: 20.00    Pack years: 8.00    Types: Cigarettes    Quit date: 03/11/1993    Years since quitting: 27.2  . Smokeless tobacco: Never Used  . Tobacco comment: PT STATES ABOUT 1 PACK A WEEK   Vaping Use  . Vaping Use: Never used  Substance and Sexual Activity  . Alcohol use: No    Comment: quit 2015  . Drug use: No  . Sexual activity: Not on file  Other Topics Concern  . Not on file  Social History Narrative   Lives at home alone   Right handed   Drinks 1 cup of coffee daily   Social Determinants of Health   Financial Resource Strain: Not on file  Food Insecurity: Not on file   Transportation Needs: Not on file  Physical Activity: Not on file  Stress: Not on file  Social Connections: Not on file    Family History  Problem Relation Age of Onset  . Cancer Sister        KIDNEY   . Allergies Sister   . Asthma Sister   . Allergies Brother   . Diabetes Brother   . Hypertension Brother   . Asthma Brother     Past Medical History:  Diagnosis Date  . Anxiety   . Asthma   . Edema of lower extremity   . Hyperlipidemia   . Hypertension   . Prediabetes   . Restless legs     Past Surgical History:  Procedure Laterality Date  . bone spur removal Right   .  BUNIONECTOMY Bilateral 2005  . PARTIAL HYSTERECTOMY  2008  . TUBAL LIGATION  1982     Current Outpatient Medications on File Prior to Visit  Medication Sig Dispense Refill  . acetaminophen (TYLENOL) 500 MG tablet Take 500 mg by mouth every 6 (six) hours as needed for mild pain.    [name] albuterol (PROAIR HFA) 108 (90 Base) MCG/ACT inhaler 2 puffs every 4 hours as needed only  if your can't catch your breath (Patient taking differently: Inhale 2 puffs into the lungs every 4 (four) hours as needed for wheezing or shortness of breath.) 18 g 2  . ALPRAZolam (XANAX) 1 MG tablet Take 1 mg by mouth 2 (two) times daily as needed for anxiety.    [name] amLODipine (NORVASC) 10 MG tablet Take 5 mg by mouth daily.    . beclomethasone (QVAR REDIHALER) 80 MCG/ACT inhaler Inhale 2 puffs into the lungs daily. 10.6 g 11  . calcium-vitamin D (OSCAL WITH D) 500-200 MG-UNIT tablet Take 1 tablet by mouth daily with breakfast.    . cholecalciferol (VITAMIN D3) 25 MCG (1000 UNIT) tablet Take 1,000 Units by mouth daily.    . clopidogrel (PLAVIX) 75 MG tablet Take 1 tablet (75 mg total) by mouth daily. 90 tablet 4  . hydrochlorothiazide (HYDRODIURIL) 25 MG tablet Take 25 mg by mouth daily.    . hydrOXYzine (ATARAX/VISTARIL) 25 MG tablet Take 25 mg by mouth at bedtime as needed for itching.    . losartan (COZAAR) 100 MG tablet Take 100  mg by mouth daily.    . Omega-3 Fatty Acids (FISH OIL) 1000 MG CAPS Take 1,000 mg by mouth daily.    . potassium chloride SA (K-DUR,KLOR-CON) 20 MEQ tablet Take 20 mEq by mouth daily.    . rosuvastatin (CRESTOR) 20 MG tablet Take 1 tablet (20 mg total) by mouth daily. 30 tablet 0   No current facility-administered medications on file prior to visit.    No Known Allergies  Physical exam:  Today's Vitals   06/13/20 0851  BP: (!) 142/89  Pulse: (!) 52  Weight: 176 lb (79.8 kg)  Height: 5' 5.5" (1.664 m)   Body mass index is 28.84 kg/m.   Wt Readings from Last 3 Encounters:  06/13/20 176 lb (79.8 kg)  05/08/20 177 lb (80.3 kg)  04/24/20 180 lb 12.4 oz (82 kg)     Ht Readings from Last 3 Encounters:  06/13/20 5' 5.5" (1.664 m)  05/08/20 5\' 5"  (1.651 m)  04/23/20 5\' 5"  (1.651 m)      General: The patient is awake, alert and appears not in acute distress. The patient is well groomed. Head: Normocephalic, atraumatic. Neck is supple. Mallampati 3,  neck circumference:14.5  inches . Nasal airflow  patent.  Retrognathia is not seen.  Dental status: biological  Cardiovascular:  Regular rate and cardiac rhythm by pulse,  without distended neck veins. Respiratory: Lungs are clear to auscultation.  Skin:  Without evidence of ankle edema, or rash. Trunk: The patient's posture is erect.   Neurologic exam : The patient is awake and alert, oriented to place and time.   Memory subjective described as intact.  Attention span & concentration ability appears normal.  Speech is fluent,  without  dysarthria, dysphonia or aphasia.  Mood and affect are appropriate.   Cranial nerves: no loss of smell or taste reported  Pupils are equal and briskly reactive to light. Funduscopic exam deferred. .  Extraocular movements in vertical and horizontal planes were intact  and without nystagmus. No Diplopia. Visual fields by finger perimetry are intact. Hearing was intact to soft voice and finger  rubbing.    Facial sensation intact to fine touch.  Facial motor strength is symmetric and tongue and uvula move midline.  Neck ROM : rotation, tilt and flexion extension were normal for age and shoulder shrug was symmetrical.    Motor exam:  Symmetric bulk, tone and ROM.   Normal tone without cog wheeling, loss of right  grip strength .  Feels clumsy in her dominant hand.    Sensory:  Fine touch, pinprick and vibration were tested  and  normal.  Proprioception tested in the upper extremities was normal.   Coordination: Rapid alternating movements in the fingers/hands were of normal speed.  The Finger-to-nose maneuver was intact without dysmetria or tremor.   Gait and station: Patient could rise unassisted from a seated position, walked without assistive device.  Stance is of normal width/ base .  Toe and heel walk were deferred.  Deep tendon reflexes: in the  upper and lower extremities are symmetric and intact.  Babinski response was deferred .       After spending a total time of 40  minutes face to face and additional time for physical and neurologic examination, review of laboratory studies,  personal review of imaging studies, reports and results of other testing and review of referral information / records as far as provided in visit, I have established the following assessments:  1) snoring and witnessed apneas  2) recent CVA- TIA  3) high grade mallampotti     My Plan is to proceed with:  1) I prefer an attended sleep study -  This patient had a stroke like event without known cause. 2) Plan B is a HST     I would like to thank Sarina Ill, MD, for allowing me to meet with and to take care of this pleasant patient.   In short, Melinda Mckay is presenting with snoring and witnessed apnea and cryptogenic stroke a symptom that can be attributed to I plan to follow up either personally or through our NP within *2-4 month.     Electronically signed by: Larey Seat, MD 06/13/2020 9:05 AM  [facility] Board certified by The [employer] of Sleep Medicine and Diplomate of the [employer] of Sleep Medicine. Board certified In Neurology through the [place], Fellow of the [employer] of Neurology. Medical Director of [facility].

## 2020-06-13 NOTE — Patient Instructions (Signed)

## 2020-06-15 ENCOUNTER — Other Ambulatory Visit: Payer: Self-pay

## 2020-06-15 ENCOUNTER — Ambulatory Visit (INDEPENDENT_AMBULATORY_CARE_PROVIDER_SITE_OTHER): Payer: BC Managed Care – PPO | Admitting: Neurology

## 2020-06-15 DIAGNOSIS — G4730 Sleep apnea, unspecified: Secondary | ICD-10-CM

## 2020-06-15 DIAGNOSIS — G4733 Obstructive sleep apnea (adult) (pediatric): Secondary | ICD-10-CM | POA: Diagnosis not present

## 2020-06-15 DIAGNOSIS — J4520 Mild intermittent asthma, uncomplicated: Secondary | ICD-10-CM

## 2020-06-15 DIAGNOSIS — G459 Transient cerebral ischemic attack, unspecified: Secondary | ICD-10-CM

## 2020-06-15 DIAGNOSIS — R0683 Snoring: Secondary | ICD-10-CM

## 2020-06-15 DIAGNOSIS — I639 Cerebral infarction, unspecified: Secondary | ICD-10-CM

## 2020-06-20 ENCOUNTER — Other Ambulatory Visit: Payer: Self-pay | Admitting: Neurology

## 2020-06-20 DIAGNOSIS — I639 Cerebral infarction, unspecified: Secondary | ICD-10-CM

## 2020-06-20 DIAGNOSIS — I4891 Unspecified atrial fibrillation: Secondary | ICD-10-CM

## 2020-06-20 DIAGNOSIS — R531 Weakness: Secondary | ICD-10-CM

## 2020-06-27 ENCOUNTER — Institutional Professional Consult (permissible substitution): Payer: Medicare Other | Admitting: Neurology

## 2020-06-29 DIAGNOSIS — R0683 Snoring: Secondary | ICD-10-CM | POA: Insufficient documentation

## 2020-06-29 DIAGNOSIS — I639 Cerebral infarction, unspecified: Secondary | ICD-10-CM | POA: Insufficient documentation

## 2020-06-29 DIAGNOSIS — J4520 Mild intermittent asthma, uncomplicated: Secondary | ICD-10-CM | POA: Insufficient documentation

## 2020-06-29 DIAGNOSIS — G4730 Sleep apnea, unspecified: Secondary | ICD-10-CM | POA: Insufficient documentation

## 2020-06-29 NOTE — Progress Notes (Signed)
IMPRESSION:  1. Overall mild Obstructive Sleep Apnea (OSA) at AHI of 13/h, which was strongly REM sleep and supine sleep- positional dependent- REM AHI was 30/h and supine AHI was 23.3/h. 2. Sleep hypoxemia, spO2 nadir was 71% with 16 minute desaturation time. 3. Non-specific abnormal EKG. 4. Very fragmented sleep architecture    RECOMMENDATIONS:  Avoiding supine sleep apposition will reduce the AHI. As for hypoxia, snoring and REM sleep apnea, there will be only PAP therapy as best treatment option for all components.   1. Advise full night, attended, CPAP titration study to optimize therapy.   2. If this is not possible, please schedule for auto CPAP with 6-16 cm water, 3 cm EPR and mask of patient's choice

## 2020-06-29 NOTE — Procedures (Signed)
PATIENT'S NAME:  Melinda Mckay DOB:      06-08-1945      MR#:    [id number]     DATE OF RECORDING: 06/15/2020  Melinda Mckay M.D.:  Melinda Ill MD Study Performed:   Baseline Polysomnogram HISTORY:  Melinda Mckay is a 75- year- old African- American female patient seen on 06/13/2020 upon Melinda Mckay referral for a sleep consultation:  Chief concern according to patient: " I am not sure I had a stroke, may be a TIA, but now I am snoring. "   Melinda Mckay has a medical history of Anxiety, Adult onset - Asthma, Edema of lower extremity, Hyperlipidemia, Hypertension, Prediabetes, GERD and Restless legs. Sleep relevant medical history: Nocturia 2-3, Sleep walking at age 37-5 years. Cervical spine DDD, PT, no deviated septum repair/ sinus surgery.   The patient endorsed the Epworth Sleepiness Scale at 8 points.   The patient's weight 176 pounds with a height of 65 (inches), resulting in a BMI of 29.4 kg/m2. The patient's neck circumference measured 14 inches.  CURRENT MEDICATIONS: Tylenol, Proair HFA, Xanax, Norvasc, Qvar, Calcium-Vitamin D, Vitamin D3, Plavix, Hydrodiuril, Atarax, Cozaar, Fish Oil, Klor-Con, Crestor   PROCEDURE:  This is a multichannel digital polysomnogram utilizing the Somnostar 11.2 system.  Electrodes and sensors were applied and monitored per AASM Specifications.   EEG, EOG, Chin and Limb EMG, were sampled at 200 Hz.  ECG, Snore and Nasal Pressure, Thermal Airflow, Respiratory Effort, CPAP Flow and Pressure, Oximetry was sampled at 50 Hz. Digital video and audio were recorded.      BASELINE STUDY: Lights Out was at 21:01 and Lights On at 05:01.  Total recording time (TRT) was 480 minutes, with a total sleep time (TST) of 354.5 minutes.  The patient's sleep latency was 50 minutes.  REM latency was 164 minutes.  The sleep efficiency was 73.9 %.     SLEEP ARCHITECTURE: WASO (Wake after sleep onset) was 92 minutes.  There were 67 minutes in Stage N1, 228 minutes  Stage N2, 20 minutes Stage N3 and 39.5 minutes in Stage REM.  The percentage of Stage N1 was 18.9%, Stage N2 was 64.3%, Stage N3 was 5.6% and Stage R (REM sleep) was 11.1%.    RESPIRATORY ANALYSIS:  There were a total of 77 respiratory events:  11 obstructive apneas, 0 central apneas and 0 mixed apneas with a total of 11 apneas and 66 hypopneas.     The total APNEA/HYPOPNEA INDEX (AHI) was 13.0/h.  20 events occurred in REM sleep and 104 events in NREM. The REM AHI was 30.4 /hour, versus a non-REM AHI of 10.9.  The patient spent 183 minutes of total sleep time in the supine position and 172 minutes in non-supine.  The supine AHI was 23.3/h versus a non-supine AHI of 2.0/h.  OXYGEN SATURATION & C02:  The Wake baseline 02 saturation was 92%, with the lowest being 71%. Time spent below 89% saturation equaled 16 minutes.  The arousals were noted as: 23 were spontaneous, 0 were associated with PLMs, 29 were associated with respiratory events. The patient had a total of 0 Periodic Limb Movements.    Audio and video analysis did not show any abnormal or unusual movements, behaviors, phonations or vocalizations.   The patient took bathroom breaks. EKG was irregular and bradycardic  IMPRESSION:  1. Overall mild Obstructive Sleep Apnea (OSA) at AHI of 13/h, which was strongly REM sleep and supine sleep- positional dependent- REM AHI was 30/h and  supine AHI was 23.3/h. 2. Sleep hypoxemia, spO2 nadir was 71% with 16 minute desaturation time. 3. Non-specific abnormal EKG. 4. Very fragmented sleep architecture    RECOMMENDATIONS:  Avoiding supine sleep apposition will reduce the AHI. As for hypoxia, snoring and REM sleep apnea, there will be only PAP therapy as best treatment option for all components.   1. Advise full night, attended, CPAP titration study to optimize therapy.   2. If this is not possible, please schedule for auto CPAP with 6-16 cm water, 3 cm EPR and mask of patient's choice.   I  certify that I have reviewed the entire raw data recording prior to the issuance of this report in accordance with the Standards of Accreditation of the American Academy of Sleep Medicine (AASM)   Melinda Seat, MD Diplomat, American Board of Psychiatry and Neurology  Diplomat, American Board of Sleep Medicine Melinda Mckay

## 2020-06-29 NOTE — Addendum Note (Signed)
Addended by: Larey Seat on: 06/29/2020 07:54 PM   Modules accepted: Orders

## 2020-07-04 ENCOUNTER — Telehealth: Payer: Self-pay | Admitting: Neurology

## 2020-07-04 NOTE — Telephone Encounter (Signed)
I called pt. I advised pt that Dr. Dohmeier  reviewed their sleep study results and found that mild to moderate sleep apnea and recommends that pt be treated with a cpap. Dr. Dohmeier recommends that pt return for a repeat sleep study in order to properly titrate the cpap and ensure a good mask fit. Pt is agreeable to returning for a titration study. I advised pt that our sleep lab will file with pt's insurance and call pt to schedule the sleep study when we hear back from the pt's insurance regarding coverage of this sleep study. Pt verbalized understanding of results. Pt had no questions at this time but was encouraged to call back if questions arise.  

## 2020-07-04 NOTE — Telephone Encounter (Signed)
-----   Message from Larey Seat, MD sent at 06/29/2020  7:54 PM EDT ----- IMPRESSION:  1. Overall mild Obstructive Sleep Apnea (OSA) at AHI of 13/h, which was strongly REM sleep and supine sleep- positional dependent- REM AHI was 30/h and supine AHI was 23.3/h. 2. Sleep hypoxemia, spO2 nadir was 71% with 16 minute desaturation time. 3. Non-specific abnormal EKG. 4. Very fragmented sleep architecture    RECOMMENDATIONS:  Avoiding supine sleep apposition will reduce the AHI. As for hypoxia, snoring and REM sleep apnea, there will be only PAP therapy as best treatment option for all components.   1. Advise full night, attended, CPAP titration study to optimize therapy.   2. If this is not possible, please schedule for auto CPAP with 6-16 cm water, 3 cm EPR and mask of patient's choice

## 2020-08-01 ENCOUNTER — Other Ambulatory Visit: Payer: Self-pay | Admitting: Internal Medicine

## 2020-08-01 DIAGNOSIS — R140 Abdominal distension (gaseous): Secondary | ICD-10-CM

## 2020-08-03 ENCOUNTER — Ambulatory Visit (INDEPENDENT_AMBULATORY_CARE_PROVIDER_SITE_OTHER): Payer: Medicare Other | Admitting: Neurology

## 2020-08-03 ENCOUNTER — Other Ambulatory Visit: Payer: Self-pay

## 2020-08-03 DIAGNOSIS — G4730 Sleep apnea, unspecified: Secondary | ICD-10-CM

## 2020-08-03 DIAGNOSIS — J4520 Mild intermittent asthma, uncomplicated: Secondary | ICD-10-CM

## 2020-08-03 DIAGNOSIS — U071 COVID-19: Secondary | ICD-10-CM

## 2020-08-03 DIAGNOSIS — G4733 Obstructive sleep apnea (adult) (pediatric): Secondary | ICD-10-CM | POA: Diagnosis not present

## 2020-08-03 DIAGNOSIS — G459 Transient cerebral ischemic attack, unspecified: Secondary | ICD-10-CM

## 2020-08-03 DIAGNOSIS — G2581 Restless legs syndrome: Secondary | ICD-10-CM

## 2020-08-03 DIAGNOSIS — I639 Cerebral infarction, unspecified: Secondary | ICD-10-CM

## 2020-08-03 DIAGNOSIS — R0683 Snoring: Secondary | ICD-10-CM

## 2020-08-09 ENCOUNTER — Ambulatory Visit
Admission: RE | Admit: 2020-08-09 | Discharge: 2020-08-09 | Disposition: A | Discharge status: Home / Self Care | Payer: BC Managed Care – PPO | Source: Ambulatory Visit | Attending: Internal Medicine | Admitting: Internal Medicine

## 2020-08-09 ENCOUNTER — Other Ambulatory Visit: Payer: Self-pay

## 2020-08-09 DIAGNOSIS — R140 Abdominal distension (gaseous): Secondary | ICD-10-CM

## 2020-08-14 ENCOUNTER — Other Ambulatory Visit: Payer: Self-pay | Admitting: Internal Medicine

## 2020-08-14 ENCOUNTER — Other Ambulatory Visit: Payer: BC Managed Care – PPO

## 2020-08-14 DIAGNOSIS — D7389 Other diseases of spleen: Secondary | ICD-10-CM

## 2020-08-14 NOTE — Procedures (Signed)
PATIENT'S NAME:  Melinda Mckay, Melinda Mckay DOB:      October 12, 1945      MR#:    [id number]     DATE OF RECORDING: 08/03/2020 Richard Miu REFERRING M.D.:  Sarina Ill, MD Study Performed:   CPAP  Titration HISTORY:  Melinda Mckay is a 75- year- old African- American female patient who had been seen on 06/13/2020 upon Dr Cathren Laine referral for a sleep consultation:  Chief concern according to patient: " I am not sure I had a stroke, may be a TIA, but now I am snoring. "  Melinda Mckay returned after her sleep study from 06-15-2020 showed this:  Overall Mild OSA at an AHI of 13/h and strongly REM sleep dependent/ Supine dependent, REM AHI was 30/h and supine AHI was 23.3/h.  SpO2 nadir 71% with 16 minutes of desaturation time., irregular EKG, fragmented sleep.   The patient endorsed the Epworth Sleepiness Scale at 8 points.   The patient's weight 176 pounds with a height of 65 (inches), resulting in a BMI of 29.4 kg/m2. The patient's neck circumference measured 14 inches.  CURRENT MEDICATIONS: Tylenol, Proair HFA, Xanax, Norvasc, Qvar, Calcium Vitamin D, Vitamin D3, Plavix, Hydrodiuril, Atarax, Cozaar, Fish Oil, Klor-Con, Crestor    PROCEDURE:  This is a multichannel digital polysomnogram utilizing the SomnoStar 11.2 system.  Electrodes and sensors were applied and monitored per AASM Specifications.   EEG, EOG, Chin and Limb EMG, were sampled at 200 Hz.  ECG, Snore and Nasal Pressure, Thermal Airflow, Respiratory Effort, CPAP Flow and Pressure, Oximetry was sampled at 50 Hz. Digital video and audio were recorded.      CPAP was initiated with a ResMed nasal pillow in medium size at 5 cmH20 with heated humidity -this pressure was advanced to 8 cmH20 because of hypopneas, apneas and desaturations.  At a PAP pressure of 8 cmH20, there was a reduction of the AHI to 0.0 with improvement of sleep apnea in REM sleep. The patient did also well on 7 cm water pressure over 99 minutes of sleep, at AHI of  0.6/h.  Lights Out was at 21:05 and Lights On at 04:53. Total recording time (TRT) was 468.5 minutes, with a total sleep time (TST) of 278 minutes. The patient's sleep latency was 74.5 minutes. REM latency was 91 minutes.  The sleep efficiency was low at 59.3 %.    SLEEP ARCHITECTURE: WASO (Wake after sleep onset) was 87 minutes.  There were 61.5 minutes in Stage N1, 180.5 minutes Stage N2, 5 minutes Stage N3 and 31 minutes in Stage REM.  The percentage of Stage N1 was 22.1%, Stage N2 was 64.9%, Stage N3 was 1.8% and Stage R (REM sleep) was 11.2%.   RESPIRATORY ANALYSIS:  There was a total of 11 respiratory events: 0 apneas and 11 hypopneas with a hypopnea index of 2.4/hour. The patient also had 0 respiratory event related arousals (RERAs).     The total APNEA/HYPOPNEA INDEX  (AHI) was 2.4 /hour and the total RESPIRATORY DISTURBANCE INDEX was 2.4 /hour.  7 events occurred in REM sleep and 4 events in NREM. The REM AHI was 13.5 /hour versus a non-REM AHI of 1.0 /hour.  The patient spent 278 minutes of total sleep time in the supine position and 0 minutes in non-supine. The supine AHI was 2.4/h, versus a non-supine AHI of 0.0.  OXYGEN SATURATION & C02:  The baseline 02 saturation was 94%, with the lowest being 84%. Time spent below 89% saturation equaled 1 minute.  The arousals were noted as: 49 were spontaneous, 0 were associated with PLMs, 0 were associated with respiratory events. The patient had a total of 0 Periodic Limb Movements.  Audio and video analysis did not show any abnormal or unusual movements, behaviors, phonations or vocalizations.   The patient took two bathroom breaks. EKG was bradycardic and irregular - see screenshots.     DIAGNOSIS 1. Obstructive Sleep Apnea was controlled under CPAP at 7 and 8 cm water pressure using a nasal pillow by ResMed in medium size, the P 10.  2. Sleep fragmentation with may arousals from sleep, many directly out of REM sleep.  3. Non-specific  abnormal EKG   PLANS/RECOMMENDATIONS: This patient's mild OSA was controlled during REM sleep and supine sleep by low pressure settings on CPAP. I will order auto CPAP at 5-15 cm water, 2 cm EPR and the medium size P10 nasal pillow.   1. Any apnea patient should avoid sedatives, hypnotics, and alcohol consumption just prior to bedtime.  2. CPAP therapy compliance is defined as 4 hours or more of nightly use.  DISCUSSION:A follow up appointment will be scheduled in the Sleep Clinic at [facility].   Please call [phone] with any questions.      I certify that I have reviewed the entire raw data recording prior to the issuance of this report in accordance with the Standards of Accreditation of the American Academy of Sleep Medicine (AASM)  Larey Seat, M.D. Diplomat, Tax adviser of Psychiatry and Neurology  Diplomat, Tax adviser of Sleep Medicine Market researcher, [employer] [facility] at [employer]

## 2020-08-14 NOTE — Progress Notes (Signed)
EKG was bradycardic and irregular - see screenshots.     DIAGNOSIS 1. Obstructive Sleep Apnea was controlled under CPAP at 7 and 8 cm water pressure using a nasal pillow by [employer] in medium size, the P 10.  2. Sleep fragmentation with may arousals from sleep, many directly out of REM sleep.  3. Non-specific abnormal EKG   PLANS/RECOMMENDATIONS: This patient's mild OSA was controlled during REM sleep and supine sleep by low pressure settings on CPAP. I will order auto CPAP at 5-15 cm water, 2 cm EPR and the medium size P10 nasal pillow.   1. Any apnea patient should avoid sedatives, hypnotics, and alcohol consumption just prior to bedtime.  2. CPAP therapy compliance is defined as 4 hours or more of nightly use.  DISCUSSION:A follow up appointment will be scheduled in the Sleep Clinic at [facility].   Please call [phone] with any questions.

## 2020-08-14 NOTE — Addendum Note (Signed)
Addended by: Larey Seat on: 08/14/2020 04:56 PM   Modules accepted: Orders

## 2020-08-16 ENCOUNTER — Encounter: Payer: Self-pay | Admitting: *Deleted

## 2020-08-16 ENCOUNTER — Other Ambulatory Visit: Payer: Self-pay | Admitting: Internal Medicine

## 2020-08-16 ENCOUNTER — Telehealth: Payer: Self-pay | Admitting: *Deleted

## 2020-08-16 NOTE — Telephone Encounter (Addendum)
I called the patient and discussed the split-night sleep study results.  The patient understands the sleep apnea was controlled on CPAP and Dr. Brett Fairy has advised for patient to begin this at home.  I also advised the patient that at night she should avoid hypnotics, sedatives, alcohol before bedtime as this can prevent her from getting effective sleep. We discussed the order will be sent to [facility].  The patient will receive a welcome call within 1 week with subsequent start of CPAP in approximately 6 to 12 weeks.  The patient scheduled a follow-up appointment for Wednesday, September 7 at 1:30 PM.  Patient is aware that insurance requires usage of CPAP for at least 4 hours every night however the recommendation from our office is to try to use it all night long.  The patient was also advised that she will need at least 30 days of CPAP usage prior to her September appointment and to please call us if for some reason she has not started her CPAP at least 30 days prior.  The patient is also aware that a letter will be sent to her home reviewing this information.  She confirmed her home address on file is correct.  Patient also aware that her EKG showed bradycardia and some irregularity of which we we will send the results over to primary care for review.  The patient verbalized appreciation for the call.  cpap letter mailed to pt and order sent to [facility].

## 2020-08-21 ENCOUNTER — Other Ambulatory Visit: Payer: BC Managed Care – PPO

## 2020-08-24 ENCOUNTER — Ambulatory Visit
Admission: RE | Admit: 2020-08-24 | Discharge: 2020-08-24 | Disposition: A | Discharge status: Home / Self Care | Payer: Medicare Other | Source: Ambulatory Visit | Attending: Internal Medicine | Admitting: Internal Medicine

## 2020-08-24 ENCOUNTER — Other Ambulatory Visit: Payer: Self-pay

## 2020-08-24 DIAGNOSIS — D7389 Other diseases of spleen: Secondary | ICD-10-CM

## 2020-08-24 MED ORDER — GADOBENATE DIMEGLUMINE 529 MG/ML IV SOLN
15.0000 mL | Freq: Once | INTRAVENOUS | Status: AC | PRN
  Administered 2020-08-24: 15 mL via INTRAVENOUS

## 2020-08-26 ENCOUNTER — Other Ambulatory Visit: Payer: BC Managed Care – PPO

## 2020-09-12 ENCOUNTER — Encounter: Payer: Self-pay | Admitting: Adult Health

## 2020-09-12 ENCOUNTER — Other Ambulatory Visit: Payer: Self-pay

## 2020-09-12 ENCOUNTER — Ambulatory Visit (INDEPENDENT_AMBULATORY_CARE_PROVIDER_SITE_OTHER): Payer: Medicare Other | Admitting: Adult Health

## 2020-09-12 VITALS — BP 122/79 | HR 52 | Ht 65.0 in | Wt 175.0 lb

## 2020-09-12 DIAGNOSIS — G4733 Obstructive sleep apnea (adult) (pediatric): Secondary | ICD-10-CM | POA: Diagnosis not present

## 2020-09-12 DIAGNOSIS — G5601 Carpal tunnel syndrome, right upper limb: Secondary | ICD-10-CM | POA: Diagnosis not present

## 2020-09-12 DIAGNOSIS — G459 Transient cerebral ischemic attack, unspecified: Secondary | ICD-10-CM | POA: Diagnosis not present

## 2020-09-12 NOTE — Progress Notes (Addendum)
[facility]    Provider:  Dr Jaynee Eagles Requesting Provider: Audley Hose, MD Primary Care Provider:  Audley Hose, MD  CC:   Chief Complaint  Patient presents with   Follow-up    Rm 3 alone  Pt is well and stable      HPI:   Today, 09/12/2020, Melinda Mckay returns for 65-month follow-up after prior initial consult visit with Dr. Jaynee Eagles for possible TIA.  She has not had any additional episodes of whole right side numbness or any other stroke/TIA symptoms since prior visit.  MR brain and MRA unremarkable. 30 day cardiac monitor negative for A. fib.  MR cervical did show narrowing at C6-7 - evaluated by Dr. Vertell Limber and per patient, surgery not indicated at this time.  She continues to experience right hand numbness/weakness - she does have hx of mild bilateral CTS dx'd by Dr. Jaynee Eagles in 2019 with conservative management at that time but has been relatively stable until February.  She does have mild symptoms in left hand but much greater than right.  She continues to use nighttime brace on right hand.  She has noticed greater difficulty doing activities with her right hand such as writing or opening bottles/jars.  She was evaluated by Dr. Brett Fairy and diagnosed with mild OSA and currently awaiting CPAP machine.  Compliant on Plavix and Crestor without associated side effects.  Blood pressure stable.  She has since retired previously working for [employer].  No further concerns at this time.    History provided for reference purposes only Initial consult visit 05/08/2020 Dr. Jaynee Eagles: Melinda Mckay is a 75 y.o. female here as requested by Audley Hose, MD for TIA.  Past medical history prediabetes, asthma, hypertension, hyperlipidemia, anxiety who was admitted on February 13 after presenting right-sided numbness.  I reviewed: Inpatient notes: Patient presented to [facility] ED complaining of right-sided numbness, she was transferred to [facility], all symptoms  resolved within 5 minutes before she came to the emergency room, she remained symptom-free, discussed with neurology, she was continued on aspirin, statin dose doubled for better control.  She was Covid positive.  LDL was greater than 100.  Echocardiogram and carotid duplex were both unremarkable (reviewed report).  I reviewed MRI of the brain images and agree: IMPRESSION: 1. Question punctate acute infarction along a gyral surface at the deep insula on the right. This is equivocal and would not seemingly result in the presenting symptoms even if real. 2. Moderate small-vessel ischemic changes elsewhere throughout the brain as outlined above.  She has had other episodes, had 2 last week, the same episode, the whole right-side of the body went completely numb, she never felt dizzy, numb sensation, February 13th, duration was 5 minutes, no slurred speech, right arm and right leg, no other symptoms. On the 21st and 22nd of February had repeat right arm numbness, lasted 30-45 seconds. She was on Aspirin prior and now on Plavix and aspirin. She snores, she works from home every day, no naps during the day, not excessively tired, doesn't wake up with headaches. She had an episode in 2018 of similar, with a workup back then. This is the first time since then that it happened. Left arm completely fine. Currently the symptoms are resolved.    Reviewed notes, labs and imaging from outside physicians, which showed:  ldl 104 hgba1c 6.3  Echo:    1. Left ventricular ejection fraction, by estimation, is 65 to 70%. The  left  ventricle has hyperdynamic function. The left ventricle has no  regional wall motion abnormalities. Left ventricular diastolic parameters  are consistent with Grade I diastolic  dysfunction (impaired relaxation).   2. Right ventricular systolic function is normal. The right ventricular  size is normal. Tricuspid regurgitation signal is inadequate for assessing  PA pressure.   3. The  mitral valve is normal in structure. No evidence of mitral valve  regurgitation. No evidence of mitral stenosis.   4. The aortic valve is tricuspid. Aortic valve regurgitation is trivial.  No aortic stenosis is present.   5. The inferior vena cava is normal in size with greater than 50%  respiratory variability, suggesting right atrial pressure of 3 mmHg.   US carotids:   Right Carotid: The extracranial vessels were near-normal with only minimal  wall                 thickening or plaque.   Left Carotid: The extracranial vessels were near-normal with only minimal  wall                thickening or plaque.   Vertebrals:  Bilateral vertebral arteries demonstrate antegrade flow.  Subclavians: Normal flow hemodynamics were seen in bilateral subclavian               arteries.   Review of Systems: Patient complains of symptoms per HPI. Pertinent negatives and positives per HPI. All others negative.   Social History   Socioeconomic History   Marital status: Widowed    Spouse name: Not on file   Number of children: 2   Years of education: Not on file   Highest education level: Bachelor's degree (e.g., BA, AB, BS)  Occupational History   Occupation: RETIRED   Tobacco Use   Smoking status: Former    Packs/day: 0.40    Years: 20.00    Pack years: 8.00    Types: Cigarettes    Quit date: 03/11/1993    Years since quitting: 27.5   Smokeless tobacco: Never   Tobacco comments:    PT STATES ABOUT 1 PACK A WEEK   Vaping Use   Vaping Use: Never used  Substance and Sexual Activity   Alcohol use: No    Comment: quit 2015   Drug use: No   Sexual activity: Not on file  Other Topics Concern   Not on file  Social History Narrative   Lives at home alone   Right handed   Drinks 1 cup of coffee daily   Social Determinants of Health   Financial Resource Strain: Not on file  Food Insecurity: Not on file  Transportation Needs: Not on file  Physical Activity: Not on file  Stress: Not  on file  Social Connections: Not on file  Intimate Partner Violence: Not on file    Family History  Problem Relation Age of Onset   Cancer Sister        KIDNEY    Allergies Sister    Asthma Sister    Allergies Brother    Diabetes Brother    Hypertension Brother    Asthma Brother     Past Medical History:  Diagnosis Date   Anxiety    Asthma    Edema of lower extremity    Hyperlipidemia    Hypertension    Prediabetes    Restless legs     Patient Active Problem List   Diagnosis Date Noted   Acute stroke due to ischemia ([place]) 06/29/2020   Snoring  06/29/2020   Sleep apnea in adult 06/29/2020   Mild intermittent asthma 06/29/2020   Hypokalemia 04/24/2020   Lab test positive for detection of COVID-19 virus 04/24/2020   TIA (transient ischemic attack) 04/23/2020   Diverticulosis of colon 01/16/2018   Internal hemorrhoids 01/16/2018   Tubular adenoma 01/16/2018   Paresthesias 04/28/2017   Anxiety disorder 11/05/2016   Edema of lower extremity 11/05/2016   Essential hypertension 11/05/2016   Hyperlipidemia 11/05/2016   Prediabetes 11/05/2016   Restless legs 11/05/2016   Mild persistent asthma in adult without complication 65/05/5463    Past Surgical History:  Procedure Laterality Date   bone spur removal Right    BUNIONECTOMY Bilateral 2005   PARTIAL HYSTERECTOMY  2008   TUBAL LIGATION  1982    Current Outpatient Medications  Medication Sig Dispense Refill   acetaminophen (TYLENOL) 500 MG tablet Take 500 mg by mouth every 6 (six) hours as needed for mild pain.     albuterol (PROAIR HFA) 108 (90 Base) MCG/ACT inhaler 2 puffs every 4 hours as needed only  if your can't catch your breath (Patient taking differently: Inhale 2 puffs into the lungs every 4 (four) hours as needed for wheezing or shortness of breath.) 18 g 2   ALPRAZolam (XANAX) 1 MG tablet Take 1 mg by mouth 2 (two) times daily as needed for anxiety.     amLODipine (NORVASC) 10 MG tablet Take 5 mg by  mouth daily.     beclomethasone (QVAR REDIHALER) 80 MCG/ACT inhaler Inhale 2 puffs into the lungs daily. 10.6 g 11   calcium-vitamin D (OSCAL WITH D) 500-200 MG-UNIT tablet Take 1 tablet by mouth daily with breakfast.     cholecalciferol (VITAMIN D3) 25 MCG (1000 UNIT) tablet Take 1,000 Units by mouth daily.     clopidogrel (PLAVIX) 75 MG tablet Take 1 tablet (75 mg total) by mouth daily. 90 tablet 4   hydrochlorothiazide (HYDRODIURIL) 25 MG tablet Take 25 mg by mouth daily.     hydrOXYzine (ATARAX/VISTARIL) 25 MG tablet Take 25 mg by mouth at bedtime as needed for itching.     losartan (COZAAR) 100 MG tablet Take 100 mg by mouth daily.     Omega-3 Fatty Acids (FISH OIL) 1000 MG CAPS Take 1,000 mg by mouth daily.     potassium chloride SA (K-DUR,KLOR-CON) 20 MEQ tablet Take 20 mEq by mouth daily.     rosuvastatin (CRESTOR) 40 MG tablet Take 40 mg by mouth daily.     No current facility-administered medications for this visit.    Allergies as of 09/12/2020   (No Known Allergies)    Vitals: Today's Vitals   09/12/20 1045  BP: 122/79  Pulse: (!) 52  Weight: 175 lb (79.4 kg)  Height: 5\' 5"  (1.651 m)   Body mass index is 29.12 kg/m.    Physical exam: Exam: Gen: NAD, conversant, well nourised, very pleasant elderly African-American female, well groomed                     CV: RRR, no MRG. No Carotid Bruits. No peripheral edema, warm, nontender Eyes: Conjunctivae clear without exudates or hemorrhage  Neuro: Detailed Neurologic Exam  Speech:    Speech is normal; fluent and spontaneous with normal comprehension.  Cognition:    The patient is oriented to person, place, and time;     recent and remote memory intact;     language fluent;     normal attention, concentration, and fund of knowledge  Cranial Nerves:    The pupils are equal, round, and reactive to light. The fundi are normal and spontaneous venous pulsations are present. Visual fields are full to finger confrontation.  Extraocular movements are intact. Trigeminal sensation is intact and the muscles of mastication are normal. The face is symmetric. The palate elevates in the midline. Hearing intact. Voice is normal. Shoulder shrug is normal. The tongue has normal motion without fasciculations.   Coordination:    Normal finger to nose and heel to shin. Normal rapid alternating movements.   Gait:    Heel-toe and tandem gait are normal.   Motor Observation:    No asymmetry, no atrophy, and no involuntary movements noted. Tone:    Normal muscle tone.    Posture:    Posture is normal. normal erect    Strength: Full strength and tone in all tested extremities     Sensation: intact to LT and position; decreased pinprick sensation right hand along median nerve route compared to left hand.  Increased cold sensation with use of cold object right hand. Negative Phalen and Tinel testing     Reflex Exam:  DTR's:    Right AJ 1+, left AJ 2+ (hx o sciatica), Deep tendon reflexes in the upper and lower extremities are otherwise brisk bilaterally.   Toes:    The toes are downgoing bilaterally.   Clonus:    Clonus is absent.     Assessment/Plan:  [age] year old with past medical history prediabetes, asthma, hypertension, hyperlipidemia, anxiety who was admitted on 04/2020 with right-sided numbness dx'd with possible TIA with recurrent episodes x2 mid February. Full stroke work-up unremarkable and has not had any reoccurring episodes or new stroke/TIA symptoms since that time.  Greatest concern today is in regards to continued right hand numbness and subjective weakness     1.  TIA -3 episodes in 04/2020 of right sided numbness - no additional episodes since that time or focal deficits -MR brain negative for acute or chronic stroke -MRA head negative -30 day cardiac monitor negative for A. Fib -Continue Plavix and Crestor 40 mg daily for secondary stroke prevention as well as close PCP follow-up for  aggressive stroke risk factor management including HTN with BP goal<130/90 and HLD with LDL goal<70  2. B/l carpal tunnel syndrome -Initially diagnosed with mild CTS 2019 by Dr. Jaynee Eagles by EMG/NCV -Symptoms have progressively worsened since that time with R>L symptoms -repeat EMG/NCV with Dr. Jaynee Eagles with concern of possible progressive CTS now possibly requiring surgical procedure -Continue to wear brace at night time as well as during the day with activities that worsen symptoms -MR cervical degenerative changes with foraminal narrowing L>R C6-7 - Dr. Jaynee Eagles referred to neurosurgery with no indication for surgical procedure at this current time per pt report   3. Obstructive sleep apnea -Sleep study 06/15/2020 AHI 13/h -followed by Dr. Brett Fairy who recommended initiating CPAP -Currently awaiting CPAP with scheduled initial CPAP compliance visit with Dr. Brett Fairy 9/7    Orders Placed This Encounter  Procedures   NCV with EMG(electromyography)      CC:  [facility] provider: Dr. Annamarie Dawley, Larey Dresser, MD   I spent 35 minutes of face-to-face and non-face-to-face time with patient.  This included previsit chart review, lab review, study review including recent MRI, MRA head and sleep study, order entry, electronic health record documentation, and patient education and discussion regarding history of likely TIA's with secondary stroke prevention education and importance of aggressive stroke risk factor management,  new dx of OSA, history of bilateral carpal tunnel syndrome with recent worsening right hand symptoms and further evaluation and answered all other questions to patient satisfaction  Melinda Mckay, AGNP-BC  [facility] [address] [place], [place] [address]  Phone [phone] Fax [phone] Note: This document was prepared with digital dictation and possible smart phrase technology. Any transcriptional errors that result from this process are  unintentional.  agree with assessment and plan as stated.     Sarina Ill, MD [facility]

## 2020-09-12 NOTE — Patient Instructions (Addendum)
Your Plan:  Order will be placed to complete EMG/NCV due to concern of possible nerve compression in your right wrist contributing to symptoms     Follow up with Dr. Jaynee Eagles based on your nerve conduction study results      Thank you for coming to see [place] at [facility]. I hope we have been able to provide you high quality care today.  You may receive a patient satisfaction survey over the next few weeks. We would appreciate your feedback and comments so that we may continue to improve ourselves and the health of our patients.

## 2020-09-28 ENCOUNTER — Encounter: Payer: Medicare Other | Admitting: Neurology

## 2020-10-09 DIAGNOSIS — R2689 Other abnormalities of gait and mobility: Secondary | ICD-10-CM | POA: Diagnosis not present

## 2020-10-09 DIAGNOSIS — M25561 Pain in right knee: Secondary | ICD-10-CM | POA: Diagnosis not present

## 2020-10-12 DIAGNOSIS — R2689 Other abnormalities of gait and mobility: Secondary | ICD-10-CM | POA: Diagnosis not present

## 2020-10-12 DIAGNOSIS — M25561 Pain in right knee: Secondary | ICD-10-CM | POA: Diagnosis not present

## 2020-10-24 DIAGNOSIS — M25561 Pain in right knee: Secondary | ICD-10-CM | POA: Diagnosis not present

## 2020-10-24 DIAGNOSIS — R2689 Other abnormalities of gait and mobility: Secondary | ICD-10-CM | POA: Diagnosis not present

## 2020-10-26 DIAGNOSIS — R2689 Other abnormalities of gait and mobility: Secondary | ICD-10-CM | POA: Diagnosis not present

## 2020-10-26 DIAGNOSIS — M25561 Pain in right knee: Secondary | ICD-10-CM | POA: Diagnosis not present

## 2020-10-30 ENCOUNTER — Ambulatory Visit: Payer: Medicare Other | Admitting: Neurology

## 2020-10-30 ENCOUNTER — Ambulatory Visit (INDEPENDENT_AMBULATORY_CARE_PROVIDER_SITE_OTHER): Payer: Medicare Other | Admitting: Neurology

## 2020-10-30 DIAGNOSIS — R202 Paresthesia of skin: Secondary | ICD-10-CM

## 2020-10-30 DIAGNOSIS — R200 Anesthesia of skin: Secondary | ICD-10-CM | POA: Diagnosis not present

## 2020-10-30 DIAGNOSIS — R29818 Other symptoms and signs involving the nervous system: Secondary | ICD-10-CM

## 2020-10-30 DIAGNOSIS — I6381 Other cerebral infarction due to occlusion or stenosis of small artery: Secondary | ICD-10-CM | POA: Insufficient documentation

## 2020-10-30 DIAGNOSIS — R29898 Other symptoms and signs involving the musculoskeletal system: Secondary | ICD-10-CM

## 2020-10-30 NOTE — Progress Notes (Signed)
History: This is a patient who had an episode of complete right sided numbness and weakness.  MRI of the brain at the time did not show any acute etiologies to explain her symptoms.  The symptoms resolved, she was continued on aspirin, statin dose doubled for better control, she was COVID-positive, echocardiogram and carotid duplex are both unremarkable.  She has several other episodes of numbness in her right arm.  She was diagnosed with carpal tunnel syndrome in 2019 however since then she had been wearing a brace and this appeared different.  She continues to have numbness, paresthesias and decreased fine motor in the right hand.  Today we discussed EMG nerve conduction study which is actually improved from 2019.  EMG and nerve conduction study of the right upper extremity completely normal, no evidence for carpal tunnel syndrome.  We reviewed images together of her cervical spine which showed several levels of moderate stenosis most apparent at C6-C7 however this does not completely explain her right hand symptoms either which encompasses all 5 fingers.  She saw Dr. Vertell Limber who preferred to follow her cervical spine and at this time not perform any surgical interventions.  She does have very brisk reflexes and some imbalance but no significant signs or symptoms of myelopathy.  At this time we will just watch her and possibly reimage if symptoms change.  We also reviewed MRI of the brain which showed chronic microvascular ischemic changes.  Assessment and plan: Patient with hypercholesterolemia, diabetes and other vascular risk factors who presented with right-sided numbness with resultant right hand sensory symptoms and fine motor changes.    MRI of the cervical spine did show multilevel spondylitic changes most prominent at C6-C7 with moderate canal stenosis; she has brisk reflexes and reports some imbalance but no significant myelopathic signs and symptoms at this time this would not explain her hand  symptoms either.  EMG nerve conduction study today showed that she does not have carpal tunnel syndrome and actually has improved compared to EMG nerve conduction study of the right hand from 2019.MRI of the brain was negative x 2 but I do suspect she had a lacunar infarct that may be we could not see on MRI.  She is lost weight, she continues to work on her diabetes and cholesterol, and she is currently on Plavix, continue.  I spent 30 minutes of face-to-face and non-face-to-face time with patient on the  1. Numbness and tingling in right hand   2. Cerebrovascular accident (CVA) due to occlusion of small artery ([place])    diagnosis.  This included previsit chart review, lab review, study review, order entry, electronic health record documentation, patient education on the different diagnostic and therapeutic options, counseling and coordination of care, risks and benefits of management, compliance, or risk factor reduction.  This does not include time performing EMG nerve conduction study.

## 2020-10-30 NOTE — Patient Instructions (Signed)
Lacunar Stroke  A lacunar stroke (lacunar infarction) happens when an area of the brain does not get enough oxygen and blood flow. This can lead to permanent brain damage. A lacunar stroke is a Engineer, materials. It must be treated right away. What are the causes? This condition is caused by a blockage in a small artery deep in the brain. This may be due to: A buildup of fatty deposits that causes the arteries to narrow (atherosclerosis). This blocks blood flow in the brain. High blood pressure. What increases the risk? You are more likely to develop this condition if you: Have high blood pressure (hypertension). Have high cholesterol (hyperlipidemia). Smoke. Have diabetes. Have heart disease or artery disease, such as carotid artery disease or peripheral artery disease. Are age 55 or older. Have a personal or family history of stroke. What are the signs or symptoms? Symptoms of this condition usually develop suddenly. They may include: Weakness or numbness in your face, arm, or leg, especially on one side of your body. Trouble walking or moving your arms or legs. Loss of balance or coordination. Slurred speech. Vision problems. Dizziness. Nausea and vomiting. Severe headache. How is this diagnosed? This condition may be diagnosed based on: Your symptoms and medical history. A physical exam. Blood tests. A CT scan or MRI of the brain. How is this treated? This condition must be treated within 3-4 hours of the start of the stroke. The goal is to restore blood flow to the brain as soon as possible. Treatments may include: Medicine given through an IV to dissolve the blood clot. Blood thinners (antiplatelets). Medicines to control blood pressure. Your health care provider may prescribe blood thinners to lower your risk ofanother stroke. Follow these instructions at home: Medicines Take over-the-counter and prescription medicines only as told by your health care provider. If you  were told to take a medicine to thin your blood, such as aspirin, take it exactly as told by your health care provider. Taking too much blood-thinning medicine can cause bleeding. Taking too little may not protect you against a stroke and other problems. Eating and drinking Follow instructions from your health care provider about diet. Eat healthy foods. This includes plenty of fruits and vegetables, lean meats, whole grains, and low-fat dairy products. Avoid foods high in saturated fat, trans fat, or sodium. If you drink alcohol: Limit how much you have to: 0-1 drink a day for women. 0-2 drinks a day for men. Know how much alcohol is in your drink. In the [place] one drink equals 12 oz of beer, 5 oz of wine, or 1 oz of hard liquor. Safety If you need help walking, use a cane or walker as told by your health care provider. Take steps to lower the risk of falls in your home. This may include: Using raised toilets and a seat in the shower. Removing clutter and tripping hazards, such as cords or area rugs. Installing grab bars in the bedroom and bathroom. Activity Exercise regularly, as told by your health care provider. Take part in rehabilitation programs as told by your health care provider. This may include physical therapy, occupational therapy, or speech therapy. General instructions Do not use any products that contain nicotine or tobacco. These products include cigarettes, chewing tobacco, and vaping devices, such as e-cigarettes. If you need help quitting, ask your health care provider. Keep all follow-up visits. This is important. Get help right away if:  You have any symptoms of stroke. "BE FAST" is an easy way  to remember the main warning signs of stroke: B - Balance. Signs are dizziness, sudden trouble walking, or loss of balance. E - Eyes. Signs are trouble seeing or a sudden change in vision. F - Face. Signs are sudden weakness or numbness of the face, or the face or eyelid  drooping on one side. A - Arms. Signs are weakness or numbness in an arm. This happens suddenly and usually on one side of the body. S - Speech. Signs are sudden trouble speaking, slurred speech, or trouble understanding what people say. T - Time. Time to call emergency services. Write down what time symptoms started. You have other signs of stroke, such as: A sudden, severe headache. Nausea or vomiting. Seizure. You have a severe fall or injury. These symptoms may represent a serious problem that is an emergency. Do not wait to see if the symptoms will go away. Get medical help right away. Call your local emergency services (911 in the [place] Do not drive yourself to the hospital. Summary A lacunar stroke (lacunar infarction) happens when an area of the brain does not get enough oxygen. This can lead to permanent brain damage. This condition is a medical emergency that must be treated right away. Treatments must be done within 3-4 hours of the start of the stroke. Controlling your risk factors for stroke is the best way to avoid another lacunar stroke. Get help right away if you have any symptoms of stroke. "BE FAST" is an easy way to remember the main warning signs of stroke. This information is not intended to replace advice given to you by your health care provider. Make sure you discuss any questions you have with your healthcare provider. Document Revised: 09/16/2019 Document Reviewed: 09/16/2019 Elsevier Patient Education  [place].

## 2020-10-31 ENCOUNTER — Telehealth: Payer: Self-pay | Admitting: Neurology

## 2020-10-31 DIAGNOSIS — R2689 Other abnormalities of gait and mobility: Secondary | ICD-10-CM | POA: Diagnosis not present

## 2020-10-31 DIAGNOSIS — M25561 Pain in right knee: Secondary | ICD-10-CM | POA: Diagnosis not present

## 2020-10-31 NOTE — Progress Notes (Signed)
Full Name: Melinda Mckay Gender: Female MRN #: [id number] Date of Birth: 1945-11-18    Visit Date: 10/30/2020 12:25 Age: 75 Years Examining Physician: Sarina Ill, MD  Referring Physician: Erline Levine, MD Height: 5 feet 5 inch Patient History: 175lbs  History: Right hand numbness Summary: EMG/NCS performed on the right upper extremity. All nerves and muscles (as indicated in the following tables) were within normal limits.   Conclusion: This is a normal EMG nerve conduction study of the right upper extremity.  No electrophysiologic evidence for mononeuropathy, polyneuropathy or radiculopathy.  There has been improvement as compared to testing completed in February 2019 which showed mild carpal tunnel syndrome.     ------------------------------- [name] M.D.  [facility] [address], [place], [place] [address] Tel: [phone] Fax: [phone]  Verbal informed consent was obtained from the patient, patient was informed of potential risk of procedure, including bruising, bleeding, hematoma formation, infection, muscle weakness, muscle pain, numbness, among others.        [place]    Nerve / Sites Muscle Latency Ref. Amplitude Ref. Rel Amp Segments Distance Velocity Ref. Area    ms ms mV mV %  cm m/s m/s mVms  R Median - APB     Wrist APB 3.1 ?4.4 11.4 ?4.0 100 Wrist - APB 7   39.2     Upper arm APB 7.5  10.5  92 Upper arm - Wrist 24 55 ?49 36.5  R Ulnar - ADM     Wrist ADM 2.8 ?3.3 9.8 ?6.0 100 Wrist - ADM 7   40.6     B.Elbow ADM 6.3  9.3  94.9 B.Elbow - Wrist 20 56 ?49 39.2     A.Elbow ADM 8.2  9.3  99.6 A.Elbow - B.Elbow 10 53 ?49 39.0         SNC    Nerve / Sites Rec. Site Peak Lat Ref.  Amp Ref. Segments Distance Peak Diff Ref.    ms ms V V  cm ms ms  R Median, Ulnar - Transcarpal comparison     Median Palm Wrist 2.2 ?2.2 97 ?35 Median Palm - Wrist 8       Ulnar Palm Wrist 2.0 ?2.2 24 ?12 Ulnar Palm - Wrist 8           Median Palm - Ulnar Palm  0.2 ?0.4  R Median - Orthodromic (Dig II, Mid palm)     Dig II Wrist 3.1 ?3.4 17 ?10 Dig II - Wrist 13    R Ulnar - Orthodromic, (Dig V, Mid palm)     Dig V Wrist 2.6 ?3.1 14 ?5 Dig V - Wrist 26             F  Wave    Nerve F Lat Ref.   ms ms  R Ulnar - ADM 29.6 ?32.0       EMG Summary Table    Spontaneous MUAP Recruitment  Muscle IA Fib PSW Fasc Other Amp Dur. Poly Pattern  R. Deltoid Normal None None None _______ Normal Normal Normal Normal  R. Triceps brachii Normal None None None _______ Normal Normal Normal Normal  R. Pronator teres Normal None None None _______ Normal Normal Normal Normal  R. First dorsal interosseous Normal None None None _______ Normal Normal Normal Normal  R. Opponens pollicis Normal None None None _______ Normal Normal Normal Normal  R. Cervical paraspinals (low) Normal None None None _______ Normal Normal  Normal Normal

## 2020-10-31 NOTE — Telephone Encounter (Signed)
OT referral sent to [facility] as requested by patient/provider. Phone: [phone].

## 2020-11-02 DIAGNOSIS — M25561 Pain in right knee: Secondary | ICD-10-CM | POA: Diagnosis not present

## 2020-11-02 DIAGNOSIS — R2689 Other abnormalities of gait and mobility: Secondary | ICD-10-CM | POA: Diagnosis not present

## 2020-11-04 NOTE — Procedures (Signed)
Full Name: Melinda Mckay Gender: Female MRN #: [id number] Date of Birth: November 09, 1945    Visit Date: 10/30/2020 12:25 Age: 75 Years Examining Physician: Sarina Ill, MD  Referring Physician: Erline Levine, MD Height: 5 feet 5 inch Patient History: 175lbs  History: Right hand numbness Summary: EMG/NCS performed on the right upper extremity. All nerves and muscles (as indicated in the following tables) were within normal limits.   Conclusion: This is a normal EMG nerve conduction study of the right upper extremity.  No electrophysiologic evidence for mononeuropathy, polyneuropathy or radiculopathy.  There has been improvement as compared to testing completed in February 2019 which showed mild carpal tunnel syndrome.     ------------------------------- [name] M.D.  [facility] [address] [place], [place] [address] Tel: [phone] Fax: [phone]  Verbal informed consent was obtained from the patient, patient was informed of potential risk of procedure, including bruising, bleeding, hematoma formation, infection, muscle weakness, muscle pain, numbness, among others.        [address]    Nerve / Sites Muscle Latency Ref. Amplitude Ref. Rel Amp Segments Distance Velocity Ref. Area    ms ms mV mV %  cm m/s m/s mVms  R Median - APB     Wrist APB 3.1 ?4.4 11.4 ?4.0 100 Wrist - APB 7   39.2     Upper arm APB 7.5  10.5  92 Upper arm - Wrist 24 55 ?49 36.5  R Ulnar - ADM     Wrist ADM 2.8 ?3.3 9.8 ?6.0 100 Wrist - ADM 7   40.6     B.Elbow ADM 6.3  9.3  94.9 B.Elbow - Wrist 20 56 ?49 39.2     A.Elbow ADM 8.2  9.3  99.6 A.Elbow - B.Elbow 10 53 ?49 39.0         SNC    Nerve / Sites Rec. Site Peak Lat Ref.  Amp Ref. Segments Distance Peak Diff Ref.    ms ms V V  cm ms ms  R Median, Ulnar - Transcarpal comparison     Median Palm Wrist 2.2 ?2.2 97 ?35 Median Palm - Wrist 8       Ulnar Palm Wrist 2.0 ?2.2 24 ?12 Ulnar Palm - Wrist 8           Median Palm - Ulnar Palm  0.2 ?0.4  R Median - Orthodromic (Dig II, Mid palm)     Dig II Wrist 3.1 ?3.4 17 ?10 Dig II - Wrist 13    R Ulnar - Orthodromic, (Dig V, Mid palm)     Dig V Wrist 2.6 ?3.1 14 ?5 Dig V - Wrist 75             F  Wave    Nerve F Lat Ref.   ms ms  R Ulnar - ADM 29.6 ?32.0       EMG Summary Table    Spontaneous MUAP Recruitment  Muscle IA Fib PSW Fasc Other Amp Dur. Poly Pattern  R. Deltoid Normal None None None _______ Normal Normal Normal Normal  R. Triceps brachii Normal None None None _______ Normal Normal Normal Normal  R. Pronator teres Normal None None None _______ Normal Normal Normal Normal  R. First dorsal interosseous Normal None None None _______ Normal Normal Normal Normal  R. Opponens pollicis Normal None None None _______ Normal Normal Normal Normal  R. Cervical paraspinals (low) Normal None None None _______ Normal Normal  Normal Normal

## 2020-11-07 DIAGNOSIS — M25561 Pain in right knee: Secondary | ICD-10-CM | POA: Diagnosis not present

## 2020-11-07 DIAGNOSIS — R2689 Other abnormalities of gait and mobility: Secondary | ICD-10-CM | POA: Diagnosis not present

## 2020-11-08 DIAGNOSIS — R6889 Other general symptoms and signs: Secondary | ICD-10-CM | POA: Diagnosis not present

## 2020-11-08 DIAGNOSIS — E785 Hyperlipidemia, unspecified: Secondary | ICD-10-CM | POA: Diagnosis not present

## 2020-11-08 DIAGNOSIS — I10 Essential (primary) hypertension: Secondary | ICD-10-CM | POA: Diagnosis not present

## 2020-11-08 DIAGNOSIS — M1711 Unilateral primary osteoarthritis, right knee: Secondary | ICD-10-CM | POA: Diagnosis not present

## 2020-11-10 DIAGNOSIS — R2689 Other abnormalities of gait and mobility: Secondary | ICD-10-CM | POA: Diagnosis not present

## 2020-11-10 DIAGNOSIS — M25561 Pain in right knee: Secondary | ICD-10-CM | POA: Diagnosis not present

## 2020-11-14 ENCOUNTER — Telehealth: Payer: Self-pay

## 2020-11-14 DIAGNOSIS — R2689 Other abnormalities of gait and mobility: Secondary | ICD-10-CM | POA: Diagnosis not present

## 2020-11-14 DIAGNOSIS — M25561 Pain in right knee: Secondary | ICD-10-CM | POA: Diagnosis not present

## 2020-11-14 NOTE — Telephone Encounter (Signed)
Called and LVM to bring CPAP and power cord to CPAP f/u tomorrow for a manual DL.   Txt James to tag [place].

## 2020-11-14 NOTE — Telephone Encounter (Signed)
James from [facility] relayed that pt has not yet received her CPAP machine. There is a note from [employer]. On 6/9 stating pt is having issues w her Ins. And would call Caryl Pina back to process the order. Pt has yet to call back.   I called pt back and LVM, will be cancelling appt at this time.

## 2020-11-15 ENCOUNTER — Ambulatory Visit: Payer: Medicare Other | Admitting: Neurology

## 2020-11-16 DIAGNOSIS — R2689 Other abnormalities of gait and mobility: Secondary | ICD-10-CM | POA: Diagnosis not present

## 2020-11-16 DIAGNOSIS — M25561 Pain in right knee: Secondary | ICD-10-CM | POA: Diagnosis not present

## 2020-11-21 DIAGNOSIS — M25561 Pain in right knee: Secondary | ICD-10-CM | POA: Diagnosis not present

## 2020-11-21 DIAGNOSIS — R2689 Other abnormalities of gait and mobility: Secondary | ICD-10-CM | POA: Diagnosis not present

## 2020-11-23 DIAGNOSIS — R2689 Other abnormalities of gait and mobility: Secondary | ICD-10-CM | POA: Diagnosis not present

## 2020-11-23 DIAGNOSIS — M25561 Pain in right knee: Secondary | ICD-10-CM | POA: Diagnosis not present

## 2020-11-27 DIAGNOSIS — M25531 Pain in right wrist: Secondary | ICD-10-CM | POA: Diagnosis not present

## 2020-11-27 DIAGNOSIS — M79644 Pain in right finger(s): Secondary | ICD-10-CM | POA: Diagnosis not present

## 2020-11-27 DIAGNOSIS — R278 Other lack of coordination: Secondary | ICD-10-CM | POA: Diagnosis not present

## 2020-12-06 DIAGNOSIS — M25531 Pain in right wrist: Secondary | ICD-10-CM | POA: Diagnosis not present

## 2020-12-06 DIAGNOSIS — M79644 Pain in right finger(s): Secondary | ICD-10-CM | POA: Diagnosis not present

## 2020-12-06 DIAGNOSIS — R278 Other lack of coordination: Secondary | ICD-10-CM | POA: Diagnosis not present

## 2020-12-13 DIAGNOSIS — R278 Other lack of coordination: Secondary | ICD-10-CM | POA: Diagnosis not present

## 2020-12-13 DIAGNOSIS — M79644 Pain in right finger(s): Secondary | ICD-10-CM | POA: Diagnosis not present

## 2020-12-13 DIAGNOSIS — M25531 Pain in right wrist: Secondary | ICD-10-CM | POA: Diagnosis not present

## 2020-12-20 DIAGNOSIS — M25531 Pain in right wrist: Secondary | ICD-10-CM | POA: Diagnosis not present

## 2020-12-20 DIAGNOSIS — M79644 Pain in right finger(s): Secondary | ICD-10-CM | POA: Diagnosis not present

## 2020-12-20 DIAGNOSIS — R278 Other lack of coordination: Secondary | ICD-10-CM | POA: Diagnosis not present

## 2020-12-27 DIAGNOSIS — M79644 Pain in right finger(s): Secondary | ICD-10-CM | POA: Diagnosis not present

## 2020-12-27 DIAGNOSIS — M25531 Pain in right wrist: Secondary | ICD-10-CM | POA: Diagnosis not present

## 2020-12-27 DIAGNOSIS — R278 Other lack of coordination: Secondary | ICD-10-CM | POA: Diagnosis not present

## 2021-01-16 ENCOUNTER — Ambulatory Visit: Payer: BC Managed Care – PPO | Admitting: Internal Medicine

## 2021-02-16 DIAGNOSIS — R7303 Prediabetes: Secondary | ICD-10-CM | POA: Diagnosis not present

## 2021-02-16 DIAGNOSIS — I10 Essential (primary) hypertension: Secondary | ICD-10-CM | POA: Diagnosis not present

## 2021-02-16 DIAGNOSIS — E785 Hyperlipidemia, unspecified: Secondary | ICD-10-CM | POA: Diagnosis not present

## 2021-02-21 DIAGNOSIS — Z13820 Encounter for screening for osteoporosis: Secondary | ICD-10-CM | POA: Diagnosis not present

## 2021-02-21 DIAGNOSIS — Z1211 Encounter for screening for malignant neoplasm of colon: Secondary | ICD-10-CM | POA: Diagnosis not present

## 2021-02-21 DIAGNOSIS — Z23 Encounter for immunization: Secondary | ICD-10-CM | POA: Diagnosis not present

## 2021-02-21 DIAGNOSIS — Z0001 Encounter for general adult medical examination with abnormal findings: Secondary | ICD-10-CM | POA: Diagnosis not present

## 2021-03-01 DIAGNOSIS — Z20822 Contact with and (suspected) exposure to covid-19: Secondary | ICD-10-CM | POA: Diagnosis not present

## 2021-03-08 ENCOUNTER — Other Ambulatory Visit: Payer: Self-pay | Admitting: Internal Medicine

## 2021-03-08 DIAGNOSIS — Z13820 Encounter for screening for osteoporosis: Secondary | ICD-10-CM

## 2021-03-08 DIAGNOSIS — Z1231 Encounter for screening mammogram for malignant neoplasm of breast: Secondary | ICD-10-CM

## 2021-03-14 DIAGNOSIS — M1711 Unilateral primary osteoarthritis, right knee: Secondary | ICD-10-CM | POA: Diagnosis not present

## 2021-03-21 DIAGNOSIS — R2689 Other abnormalities of gait and mobility: Secondary | ICD-10-CM | POA: Diagnosis not present

## 2021-03-21 DIAGNOSIS — M25551 Pain in right hip: Secondary | ICD-10-CM | POA: Diagnosis not present

## 2021-03-27 DIAGNOSIS — M25551 Pain in right hip: Secondary | ICD-10-CM | POA: Diagnosis not present

## 2021-03-27 DIAGNOSIS — R2689 Other abnormalities of gait and mobility: Secondary | ICD-10-CM | POA: Diagnosis not present

## 2021-03-29 DIAGNOSIS — M25551 Pain in right hip: Secondary | ICD-10-CM | POA: Diagnosis not present

## 2021-03-29 DIAGNOSIS — R2689 Other abnormalities of gait and mobility: Secondary | ICD-10-CM | POA: Diagnosis not present

## 2021-04-03 DIAGNOSIS — R2689 Other abnormalities of gait and mobility: Secondary | ICD-10-CM | POA: Diagnosis not present

## 2021-04-03 DIAGNOSIS — M25551 Pain in right hip: Secondary | ICD-10-CM | POA: Diagnosis not present

## 2021-04-05 DIAGNOSIS — M25551 Pain in right hip: Secondary | ICD-10-CM | POA: Diagnosis not present

## 2021-04-05 DIAGNOSIS — R2689 Other abnormalities of gait and mobility: Secondary | ICD-10-CM | POA: Diagnosis not present

## 2021-04-10 DIAGNOSIS — R2689 Other abnormalities of gait and mobility: Secondary | ICD-10-CM | POA: Diagnosis not present

## 2021-04-10 DIAGNOSIS — M25551 Pain in right hip: Secondary | ICD-10-CM | POA: Diagnosis not present

## 2021-04-12 DIAGNOSIS — M25551 Pain in right hip: Secondary | ICD-10-CM | POA: Diagnosis not present

## 2021-04-12 DIAGNOSIS — R2689 Other abnormalities of gait and mobility: Secondary | ICD-10-CM | POA: Diagnosis not present

## 2021-04-30 DIAGNOSIS — M1711 Unilateral primary osteoarthritis, right knee: Secondary | ICD-10-CM | POA: Diagnosis not present

## 2021-05-10 DIAGNOSIS — G2581 Restless legs syndrome: Secondary | ICD-10-CM | POA: Diagnosis not present

## 2021-05-10 DIAGNOSIS — G8311 Monoplegia of lower limb affecting right dominant side: Secondary | ICD-10-CM | POA: Diagnosis not present

## 2021-05-10 DIAGNOSIS — I700 Atherosclerosis of aorta: Secondary | ICD-10-CM | POA: Diagnosis not present

## 2021-05-10 DIAGNOSIS — R7303 Prediabetes: Secondary | ICD-10-CM | POA: Diagnosis not present

## 2021-07-27 DIAGNOSIS — R0602 Shortness of breath: Secondary | ICD-10-CM | POA: Diagnosis not present

## 2021-07-27 DIAGNOSIS — Z20822 Contact with and (suspected) exposure to covid-19: Secondary | ICD-10-CM | POA: Diagnosis not present

## 2021-07-27 DIAGNOSIS — J189 Pneumonia, unspecified organism: Secondary | ICD-10-CM | POA: Diagnosis not present

## 2021-08-07 ENCOUNTER — Ambulatory Visit
Admission: RE | Admit: 2021-08-07 | Discharge: 2021-08-07 | Disposition: A | Discharge status: Home / Self Care | Payer: Medicare Other | Source: Ambulatory Visit | Attending: Family Medicine | Admitting: Family Medicine

## 2021-08-07 ENCOUNTER — Other Ambulatory Visit: Payer: Self-pay | Admitting: Family Medicine

## 2021-08-07 DIAGNOSIS — E559 Vitamin D deficiency, unspecified: Secondary | ICD-10-CM | POA: Diagnosis not present

## 2021-08-07 DIAGNOSIS — R7303 Prediabetes: Secondary | ICD-10-CM | POA: Diagnosis not present

## 2021-08-07 DIAGNOSIS — R059 Cough, unspecified: Secondary | ICD-10-CM | POA: Diagnosis not present

## 2021-08-07 DIAGNOSIS — I10 Essential (primary) hypertension: Secondary | ICD-10-CM | POA: Diagnosis not present

## 2021-08-07 DIAGNOSIS — E785 Hyperlipidemia, unspecified: Secondary | ICD-10-CM | POA: Diagnosis not present

## 2021-08-07 DIAGNOSIS — Z8701 Personal history of pneumonia (recurrent): Secondary | ICD-10-CM

## 2021-08-10 DIAGNOSIS — R3129 Other microscopic hematuria: Secondary | ICD-10-CM | POA: Diagnosis not present

## 2021-08-10 DIAGNOSIS — R2689 Other abnormalities of gait and mobility: Secondary | ICD-10-CM | POA: Diagnosis not present

## 2021-08-10 DIAGNOSIS — R5383 Other fatigue: Secondary | ICD-10-CM | POA: Diagnosis not present

## 2021-08-10 DIAGNOSIS — R7303 Prediabetes: Secondary | ICD-10-CM | POA: Diagnosis not present

## 2021-08-20 DIAGNOSIS — I10 Essential (primary) hypertension: Secondary | ICD-10-CM | POA: Diagnosis not present

## 2021-08-20 DIAGNOSIS — H109 Unspecified conjunctivitis: Secondary | ICD-10-CM | POA: Diagnosis not present

## 2021-08-21 ENCOUNTER — Ambulatory Visit
Admission: RE | Admit: 2021-08-21 | Discharge: 2021-08-21 | Disposition: A | Discharge status: Home / Self Care | Payer: Medicare Other | Source: Ambulatory Visit | Attending: Internal Medicine | Admitting: Internal Medicine

## 2021-08-21 DIAGNOSIS — Z780 Asymptomatic menopausal state: Secondary | ICD-10-CM | POA: Diagnosis not present

## 2021-08-21 DIAGNOSIS — M8589 Other specified disorders of bone density and structure, multiple sites: Secondary | ICD-10-CM | POA: Diagnosis not present

## 2021-08-21 DIAGNOSIS — Z1231 Encounter for screening mammogram for malignant neoplasm of breast: Secondary | ICD-10-CM

## 2021-08-21 DIAGNOSIS — Z13820 Encounter for screening for osteoporosis: Secondary | ICD-10-CM

## 2021-08-23 ENCOUNTER — Other Ambulatory Visit: Payer: Self-pay | Admitting: Internal Medicine

## 2021-08-23 DIAGNOSIS — R928 Other abnormal and inconclusive findings on diagnostic imaging of breast: Secondary | ICD-10-CM

## 2021-09-04 ENCOUNTER — Other Ambulatory Visit: Payer: Self-pay | Admitting: Internal Medicine

## 2021-09-04 ENCOUNTER — Other Ambulatory Visit: Payer: Self-pay | Admitting: Neurology

## 2021-09-05 ENCOUNTER — Other Ambulatory Visit: Payer: Self-pay | Admitting: *Deleted

## 2021-09-05 ENCOUNTER — Encounter: Payer: Self-pay | Admitting: Neurology

## 2021-09-05 MED ORDER — CLOPIDOGREL BISULFATE 75 MG PO TABS
75.0000 mg | ORAL_TABLET | Freq: Every day | ORAL | 0 refills | Status: AC
Start: 2021-09-05 — End: ?

## 2021-09-10 ENCOUNTER — Ambulatory Visit
Admission: RE | Admit: 2021-09-10 | Discharge: 2021-09-10 | Disposition: A | Discharge status: Home / Self Care | Payer: Medicare Other | Source: Ambulatory Visit | Attending: Internal Medicine | Admitting: Internal Medicine

## 2021-09-10 DIAGNOSIS — R928 Other abnormal and inconclusive findings on diagnostic imaging of breast: Secondary | ICD-10-CM | POA: Diagnosis not present

## 2021-09-10 DIAGNOSIS — N6489 Other specified disorders of breast: Secondary | ICD-10-CM | POA: Diagnosis not present

## 2021-09-18 ENCOUNTER — Ambulatory Visit: Payer: Medicare Other | Admitting: Internal Medicine

## 2021-09-18 ENCOUNTER — Encounter: Payer: Self-pay | Admitting: Internal Medicine

## 2021-09-18 DIAGNOSIS — J4530 Mild persistent asthma, uncomplicated: Secondary | ICD-10-CM

## 2021-09-18 MED ORDER — QVAR REDIHALER 80 MCG/ACT IN AERB: 2.0000 | INHALATION_SPRAY | Freq: Every day | RESPIRATORY_TRACT | 11 refills | Status: DC

## 2021-09-18 NOTE — Patient Instructions (Signed)
No change in medications  Work on inhaler technique:  relax and gently blow all the way out then take a nice smooth full deep breath back in, triggering the inhaler at same time you start breathing in.  Hold for up to 5 seconds if you can. Blow out thru nose. Rinse and gargle with water when done.  If mouth or throat bother you at all,  try brushing teeth/gums/tongue with arm and hammer toothpaste/ make a slurry and gargle and spit out.    Follow up yearly or for refills

## 2021-09-18 NOTE — Progress Notes (Signed)
Subjective:     Patient ID: Melinda Mckay, female   DOB: 01-20-1946,    MRN: [id number]    Brief patient profile:  20 yobf quit smoking 1995 with dx of asthma by Dr Gwenette Greet final ov  11/10/13 rec  maint asthmanex 220 2 each day/ prn saba  Primary is Dr Maia Petties   History of Present Illness  09/11/2015  1st   office visit/ Melinda Mckay  - transition of care Chief Complaint  Patient presents with   Follow-up    Former Dr Gwenette Greet pt here for medication refill. Pt states that her breathing is overall doing well and denies any co's today. She very rarely uses albuterol.   very confused with meds, thinks qvar is a rescue med/ no problem from coughing on asmanex dpi  rec Plan A = Automatic = asthmanex or qvar 2 puffs daily  Plan B = Backup Only use your albuterol(Proair) as a rescue medication  GERD  Diet     09/10/2016  f/u ov/Melinda Mckay re:  Mild persistent asthma / qvar 80 2 pffs each pm  Chief Complaint  Patient presents with   Follow-up    Breathing is doing well. No new co's. She very rarely uses her rescue inhaler.   Not limited by breathing from desired activities but very sedentary   rec Plan A = Automatic = Qvar 80  2 pffs daily  Work on inhaler technique:  Plan B = Backup Only use your albuterol (proair)  as a rescue medication    01/17/2020  f/u ov/Melinda Mckay re: mild chronic asthma on qvar 80 2pffs /day never increased even with recent uri  Chief Complaint  Patient presents with   Follow-up    Breathing is doing well and she has not needed her albuterol.    Dyspnea:  Not limited by breathing from desired activities   Cough: none  Sleeping: flat is flat/ one pillow SABA use: none  02: none   Rec Plan A = Automatic = Qvar 80 2pffs daily - increase to 2 puffs every 12 hours for any flare of respiratory symptoms  Work on inhaler technique:  Plan B = Backup Only use your albuterol as a rescue medication     09/18/2021  f/u ov/Melinda Mckay re: mild asthma   maint on qvar  80 2 daily  Chief  Complaint  Patient presents with   Follow-up    Pt states she had a minor stroke x1 year ago and pneumonia x6 weeks ago. No cough and no SOB   Dyspnea:  Not limited by breathing from desired activities  / did PT p cva R leg  Cough: none  Sleeping: flat bed/ one pillow SABA use: rarely  02: none  Covid status: all of em   No obvious day to day or daytime variability or assoc excess/ purulent sputum or mucus plugs or hemoptysis or cp or chest tightness, subjective wheeze or overt sinus or hb symptoms.   Sleeping  without nocturnal  or early am exacerbation  of respiratory  c/o's or need for noct saba. Also denies any obvious fluctuation of symptoms with weather or environmental changes or other aggravating or alleviating factors except as outlined above   No unusual exposure hx or h/o childhood pna/ asthma or knowledge of premature birth.  Current Allergies, Complete Past Medical History, Past Surgical History, Family History, and Social History were reviewed in [employer] record.  ROS  The following are not active complaints unless bolded Hoarseness, sore  throat, dysphagia, dental problems, itching, sneezing,  nasal congestion or discharge of excess mucus or purulent secretions, ear ache,   fever, chills, sweats, unintended wt loss or wt gain, classically pleuritic or exertional cp,  orthopnea pnd or arm/hand swelling  or leg swelling min, presyncope, palpitations, abdominal pain, anorexia, nausea, vomiting, diarrhea  or change in bowel habits or change in bladder habits, change in stools or change in urine, dysuria, hematuria,  rash, arthralgias, visual complaints, headache, numbness, weakness or ataxia or problems with walking or coordination,  change in mood or  memory.        Current Meds  Medication Sig   acetaminophen (TYLENOL) 500 MG tablet Take 500 mg by mouth every 6 (six) hours as needed for mild pain.   albuterol (PROAIR HFA) 108 (90 Base) MCG/ACT inhaler  2 puffs every 4 hours as needed only  if your can't catch your breath (Patient taking differently: Inhale 2 puffs into the lungs every 4 (four) hours as needed for wheezing or shortness of breath.)   ALPRAZolam (XANAX) 1 MG tablet Take 1 mg by mouth 2 (two) times daily as needed for anxiety.   amLODipine (NORVASC) 10 MG tablet Take 5 mg by mouth daily.   beclomethasone (QVAR REDIHALER) 80 MCG/ACT inhaler Inhale 2 puffs into the lungs daily.   calcium-vitamin D (OSCAL WITH D) 500-200 MG-UNIT tablet Take 1 tablet by mouth daily with breakfast.   cholecalciferol (VITAMIN D3) 25 MCG (1000 UNIT) tablet Take 1,000 Units by mouth daily.   clopidogrel (PLAVIX) 75 MG tablet Take 1 tablet (75 mg total) by mouth daily.   hydrochlorothiazide (HYDRODIURIL) 25 MG tablet Take 25 mg by mouth daily.   hydrOXYzine (ATARAX/VISTARIL) 25 MG tablet Take 25 mg by mouth at bedtime as needed for itching.   losartan (COZAAR) 100 MG tablet Take 100 mg by mouth daily.   Omega-3 Fatty Acids (FISH OIL) 1000 MG CAPS Take 1,000 mg by mouth daily.   potassium chloride SA (K-DUR,KLOR-CON) 20 MEQ tablet Take 20 mEq by mouth daily.   rosuvastatin (CRESTOR) 40 MG tablet Take 40 mg by mouth daily.                       Objective:   Physical Exam  Wts   09/18/2021       163 01/17/2020       182 10/15/2017         176  09/10/2016         175   09/11/15 178 lb (80.74 kg)  11/10/13 184 lb (83.462 kg)  11/11/12 197 lb (89.359 kg)    Vital signs reviewed  09/18/2021  - Note at rest 02 sats  98% on RA   General appearance:    amb pleasant bf nad   HEENT : Oropharynx  clear      Nasal turbinates nl    NECK :  without  apparent JVD/ palpable Nodes/TM    LUNGS: no acc muscle use,  Nl contour chest which is clear to A and P bilaterally without cough on insp or exp maneuvers   CV:  RRR  no s3 or murmur or increase in P2, and no edema   ABD:  soft and nontender with nl inspiratory excursion in the supine position. No bruits  or organomegaly appreciated   MS:  Nl gait/ ext warm without deformities Or obvious joint restrictions  calf tenderness, cyanosis or clubbing    SKIN: warm and dry without  lesions    NEURO:  alert, approp, nl sensorium with  no motor or cerebellar deficits apparent.     I personally reviewed images and agree with radiology impression as follows:  CXR:   pa and lateral   08/07/21 No active cardiopulmonary disease.                 Assessment:

## 2021-09-18 NOTE — Assessment & Plan Note (Addendum)
Quit smoking 1995  PFT's 08/2011:  FEV1 1.81 (84%), ratio 69, 19% change with BD and return of ratio to normal, ++airtrapping. - 09/10/2016  After extensive coaching HFA effectiveness =   75% even with autohaler (short Ti) - FENO 10/15/2017  =   24   - 01/17/2020  After extensive coaching inhaler device,  effectiveness =  75%  From a baseline 50%  - 09/18/2021  After extensive coaching inhaler device,  effectiveness =   80%   All goals of chronic asthma control met including optimal function and elimination of symptoms with minimal need for rescue therapy on qvar 80 2 each am and pm doses optional  Contingencies discussed in full including contacting this office immediately if not controlling the symptoms using the rule of two's.      - The proper method of use, as well as anticipated side effects, of a metered-dose inhaler were discussed and demonstrated to the patient using teach back method. Improved effectiveness after extensive coaching during this visit to a level of approximately 80 % from a baseline of 60 % > continue hfa        >>> f/u yearly or for refills, whichever comes last  - in meantime prn  Each maintenance medication was reviewed in detail including emphasizing most importantly the difference between maintenance and prns and under what circumstances the prns are to be triggered using an action plan format where appropriate.  Total time for H and P, chart review, counseling, reviewing hfa device(s) and generating customized AVS unique to this office visit / same day charting = 21 min

## 2021-09-19 ENCOUNTER — Encounter: Payer: Self-pay | Admitting: Internal Medicine

## 2021-09-19 NOTE — Telephone Encounter (Signed)
Dr. Melvyn Novas...in your follow up notes, you indicated, patient is "very confused with meds, thinks QVAR is a rescue med". Just to be clear, I'm not confused. I've taken 2 puffs daily of the QVAR Redihaler as instructed since it was first prescribed.  Fortunately, I've only experienced  ONE episode Aug 03, 2021, with shortness of breath,  wheezing,  and extreme fatigue. I used the RESCUE inhaler as I thought of was having an asthma attack,  I sought medical attention at Urgent Care and was diagnosed with pneumonia.   Please be aware I do know and completely understand the appropriate use of both medications.   Thank you.   Dr. Melvyn Novas please advise.

## 2021-09-21 ENCOUNTER — Other Ambulatory Visit: Payer: Self-pay | Admitting: Internal Medicine

## 2021-09-21 DIAGNOSIS — M7989 Other specified soft tissue disorders: Secondary | ICD-10-CM

## 2021-09-27 ENCOUNTER — Other Ambulatory Visit: Payer: Self-pay | Admitting: Adult Health

## 2021-10-02 ENCOUNTER — Other Ambulatory Visit: Payer: Self-pay | Admitting: Adult Health

## 2021-10-25 ENCOUNTER — Ambulatory Visit: Payer: Medicare Other | Admitting: Internal Medicine

## 2021-11-08 DIAGNOSIS — I10 Essential (primary) hypertension: Secondary | ICD-10-CM | POA: Diagnosis not present

## 2021-11-08 DIAGNOSIS — E119 Type 2 diabetes mellitus without complications: Secondary | ICD-10-CM | POA: Diagnosis not present

## 2021-11-08 DIAGNOSIS — H2513 Age-related nuclear cataract, bilateral: Secondary | ICD-10-CM | POA: Diagnosis not present

## 2021-11-08 DIAGNOSIS — H35413 Lattice degeneration of retina, bilateral: Secondary | ICD-10-CM | POA: Diagnosis not present

## 2021-11-20 DIAGNOSIS — Z8673 Personal history of transient ischemic attack (TIA), and cerebral infarction without residual deficits: Secondary | ICD-10-CM | POA: Diagnosis not present

## 2021-11-20 DIAGNOSIS — M791 Myalgia, unspecified site: Secondary | ICD-10-CM | POA: Diagnosis not present

## 2021-11-20 DIAGNOSIS — R5383 Other fatigue: Secondary | ICD-10-CM | POA: Diagnosis not present

## 2021-11-20 DIAGNOSIS — E559 Vitamin D deficiency, unspecified: Secondary | ICD-10-CM | POA: Diagnosis not present

## 2021-11-20 DIAGNOSIS — E785 Hyperlipidemia, unspecified: Secondary | ICD-10-CM | POA: Diagnosis not present

## 2021-11-20 DIAGNOSIS — R7303 Prediabetes: Secondary | ICD-10-CM | POA: Diagnosis not present

## 2021-11-21 ENCOUNTER — Other Ambulatory Visit: Payer: Self-pay | Admitting: Internal Medicine

## 2021-11-21 DIAGNOSIS — D7389 Other diseases of spleen: Secondary | ICD-10-CM

## 2021-12-10 DIAGNOSIS — H524 Presbyopia: Secondary | ICD-10-CM | POA: Diagnosis not present

## 2021-12-12 ENCOUNTER — Ambulatory Visit
Admission: RE | Admit: 2021-12-12 | Discharge: 2021-12-12 | Disposition: A | Discharge status: Home / Self Care | Payer: Medicare Other | Source: Ambulatory Visit | Attending: Internal Medicine | Admitting: Internal Medicine

## 2021-12-12 DIAGNOSIS — R928 Other abnormal and inconclusive findings on diagnostic imaging of breast: Secondary | ICD-10-CM | POA: Diagnosis not present

## 2021-12-12 DIAGNOSIS — M7989 Other specified soft tissue disorders: Secondary | ICD-10-CM

## 2021-12-12 DIAGNOSIS — N641 Fat necrosis of breast: Secondary | ICD-10-CM | POA: Diagnosis not present

## 2021-12-17 ENCOUNTER — Other Ambulatory Visit: Payer: Self-pay | Admitting: Neurosurgery

## 2021-12-17 DIAGNOSIS — R29898 Other symptoms and signs involving the musculoskeletal system: Secondary | ICD-10-CM

## 2021-12-17 DIAGNOSIS — M4802 Spinal stenosis, cervical region: Secondary | ICD-10-CM | POA: Diagnosis not present

## 2021-12-17 DIAGNOSIS — M5030 Other cervical disc degeneration, unspecified cervical region: Secondary | ICD-10-CM | POA: Diagnosis not present

## 2021-12-17 DIAGNOSIS — R200 Anesthesia of skin: Secondary | ICD-10-CM | POA: Diagnosis not present

## 2021-12-22 ENCOUNTER — Other Ambulatory Visit: Payer: Medicare Other

## 2021-12-24 IMAGING — MR MR HEAD W/O CM
14 of 16 series · 37 of 48 positions shown · non-contrast
Comparison: Head CT earlier tonight.

CLINICAL DATA: Transient ischemic attack. Dizziness and right-sided
weakness.

EXAM:
MRI HEAD WITHOUT CONTRAST
TECHNIQUE: Multiplanar, multiecho pulse sequences of the brain and surrounding
structures were obtained without intravenous contrast.

[Series 5: DWI · axial · 3.0mm · 0.88mm/px · z∈[-76,+71]mm · 5 of 100 slices shown (1 of 4)]
[im 1/100]
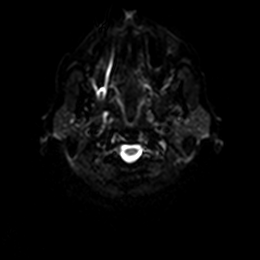
[im 25/100]
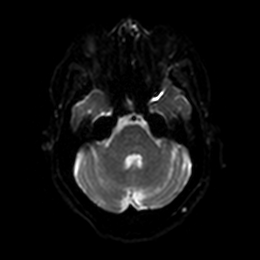
[im 50/100]
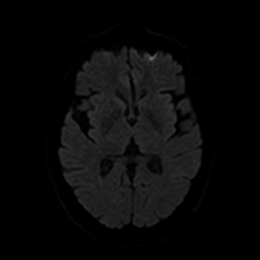
[im 75/100]
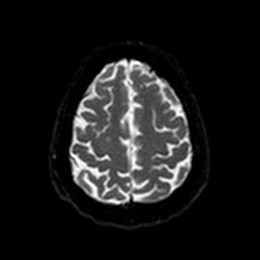
[im 100/100]
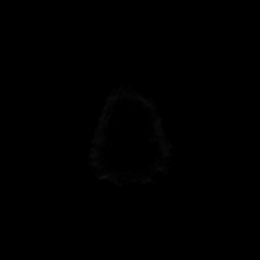

[Series 6: DWI · axial · 3.0mm · 0.88mm/px · z∈[-76,+71]mm · 3 of 50 slices shown (2 of 4)]
[im 1/50]
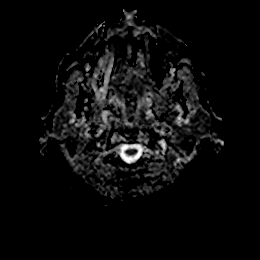
[im 25/50]
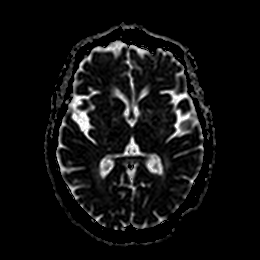
[im 50/50]
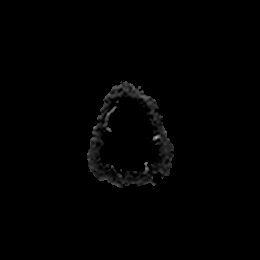

[Series 7: DWI · coronal · 4.0mm · 0.88mm/px · 3 of 70 slices shown (3 of 4)]
[im 1/70]
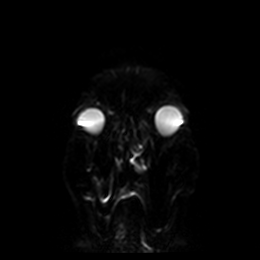
[im 35/70]
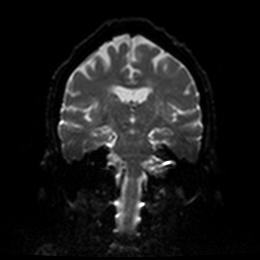
[im 70/70]
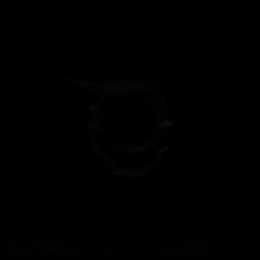

[Series 8: DWI · coronal · 4.0mm · 0.88mm/px · 2 of 35 slices shown (4 of 4)]
[im 1/35]
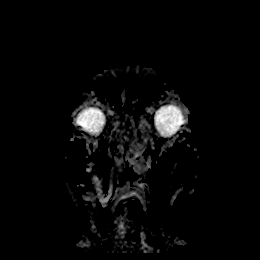
[im 35/35]
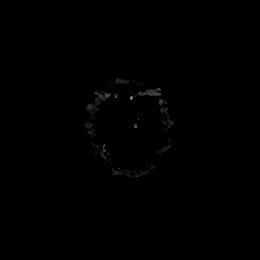

[Series 9: T1 · sagittal · 5.0mm · 0.75mm/px · 1 of 25 slices shown]
[im 1/25]
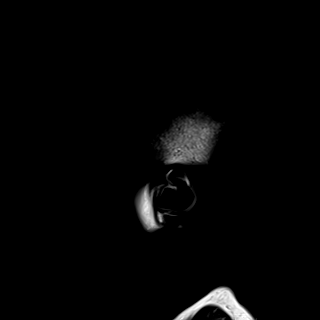

[Series 10: T2 · axial · 5.0mm · 0.72mm/px · 1 of 25 slices shown (1 of 2)]
[im 1/25]
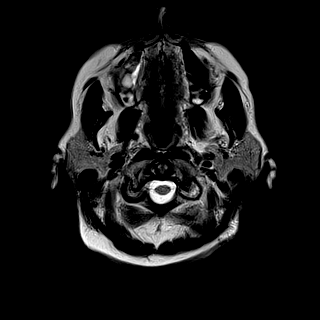

[Series 11: FLAIR · axial · 5.0mm · 0.90mm/px · 1 of 25 slices shown]
[im 1/25]
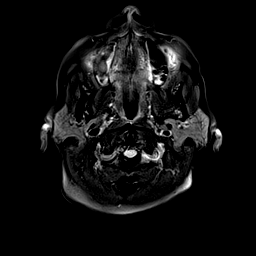

[Series 12: mag_images · axial · 3.0mm · 0.90mm/px · z∈[-76,+76]mm · 2 of 52 slices shown]
[im 1/52]
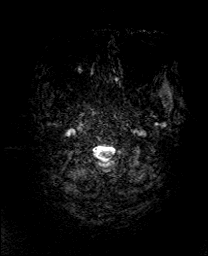
[im 52/52]
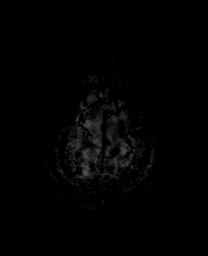

[Series 13: pha_images · axial · 3.0mm · 0.90mm/px · z∈[-76,+76]mm · 2 of 52 slices shown]
[im 1/52]
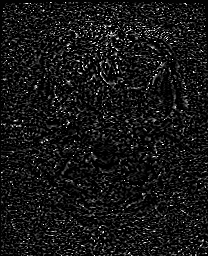
[im 52/52]
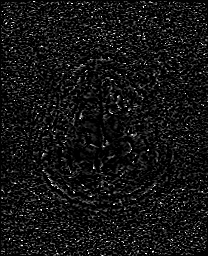

[Series 14: swi_images · axial · 3.0mm · 0.90mm/px · z∈[-76,+76]mm · 2 of 52 slices shown]
[im 1/52]
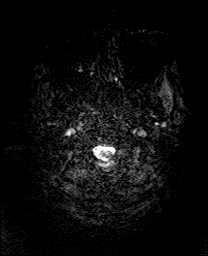
[im 52/52]
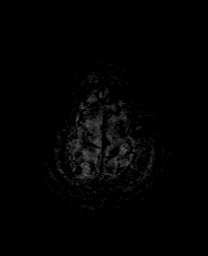

[Series 15: mip_images(sw) · axial · 24.0mm · 0.90mm/px · z∈[-66,+66]mm · 2 of 45 slices shown]
[im 1/45]
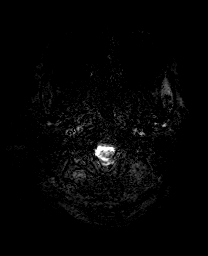
[im 45/45]
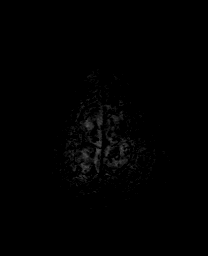

[Series 17: T2 · coronal · 5.0mm · 0.34mm/px · 1 of 30 slices shown (2 of 2)]
[im 1/30]
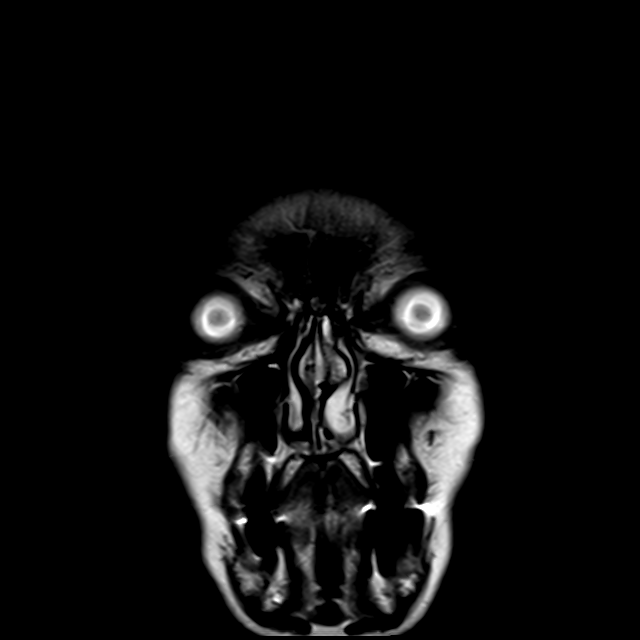

[Series 18: t2_space_dark-fluid_sag_p2_ns-ir · sagittal · 1.0mm · 0.49mm/px · 6 of 144 slices shown]
[im 1/144]
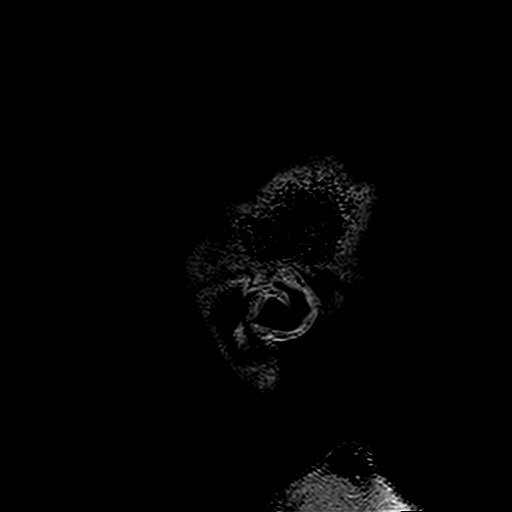
[im 29/144]
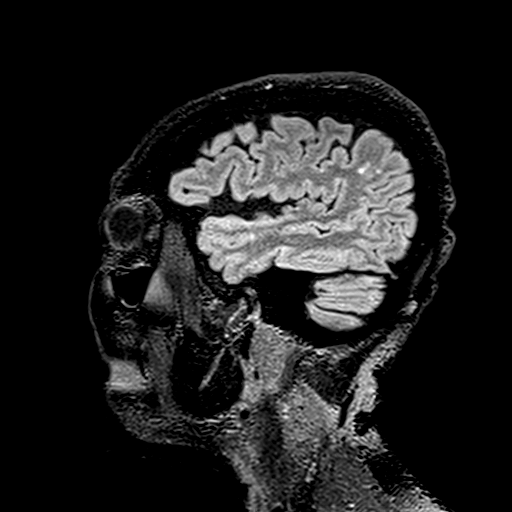
[im 58/144]
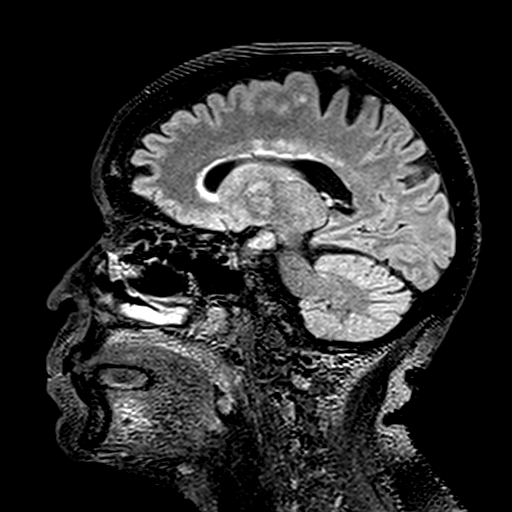
[im 86/144]
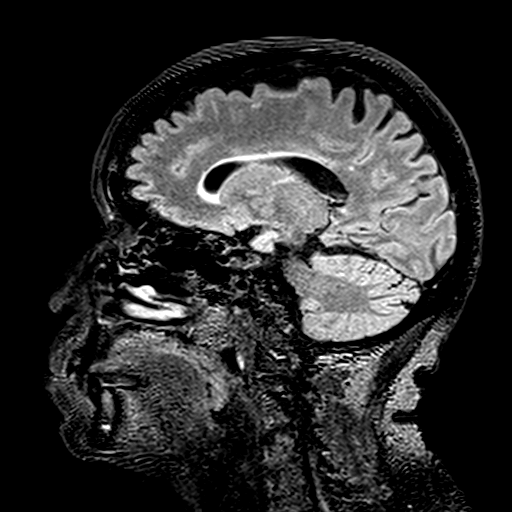
[im 115/144]
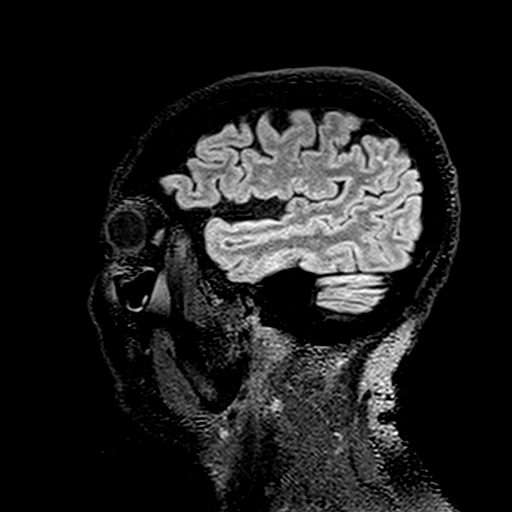
[im 144/144]
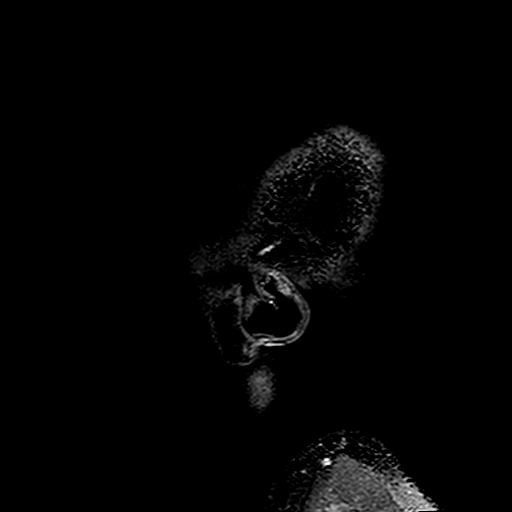

[Series 19: t2_space_dark-fluid_sag_p2_ns-ir_mpr_ axial · axial · 1.0mm · 0.42mm/px · z∈[-85,+49]mm · 6 of 162 slices shown]
[im 1/162]
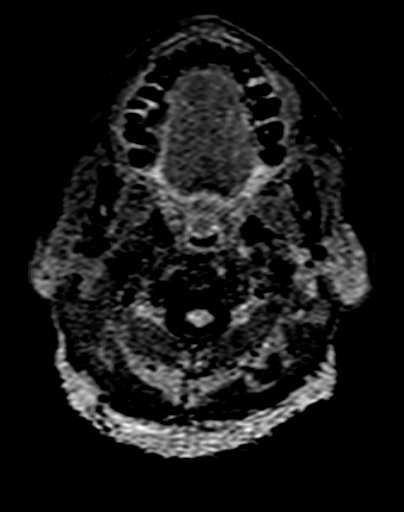
[im 27/162]
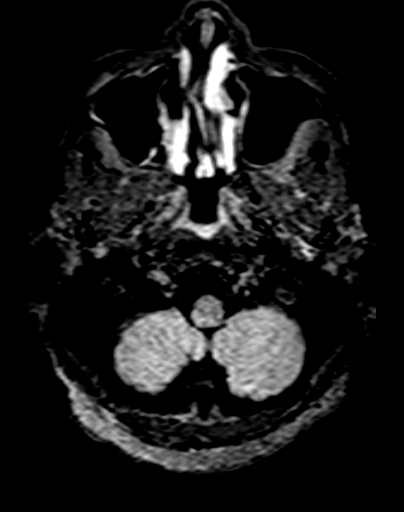
[im 54/162]
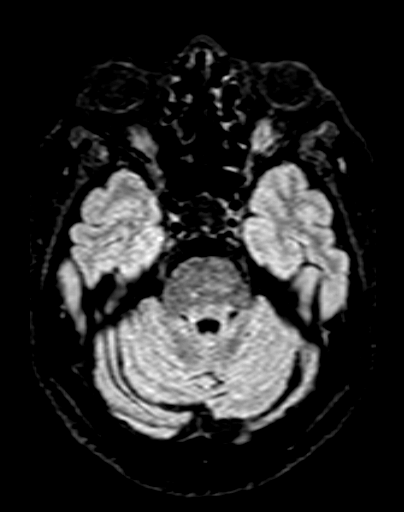
[im 81/162]
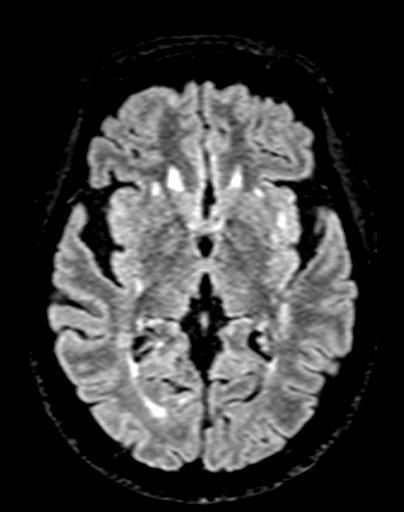
[im 108/162]
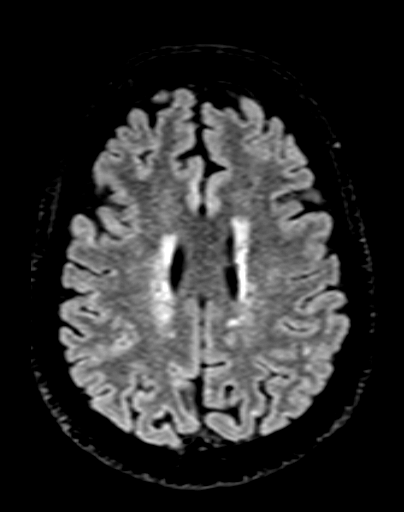
[im 135/162]
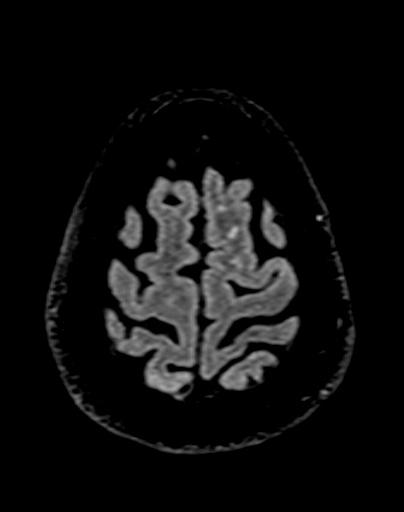

[37 of 48 positions shown; findings below may reference images not displayed]

FINDINGS: Brain: No definite acute infarction. There could be a punctate acute
infarction along a gyral surface at the deep insula on the right.
This is equivocal and minimal and would not seemingly result in the
presenting symptoms even if real. Elsewhere, the brainstem and
cerebellum are normal. Cerebral hemispheres show moderate
small-vessel disease affecting the thalami, basal ganglia and
hemispheric white matter. No cortical or large vessel territory
infarction. No mass lesion, hemorrhage, hydrocephalus or extra-axial
collection.

Vascular: Major vessels at the base of the brain show flow.

Skull and upper cervical spine: Negative

Sinuses/Orbits: Clear/normal

Other: None
IMPRESSION: 1. Question punctate acute infarction along a gyral surface at the
deep insula on the right. This is equivocal and would not seemingly
result in the presenting symptoms even if real.
2. Moderate small-vessel ischemic changes elsewhere throughout the
brain as outlined above.

## 2021-12-27 ENCOUNTER — Ambulatory Visit
Admission: RE | Admit: 2021-12-27 | Discharge: 2021-12-27 | Disposition: A | Discharge status: Home / Self Care | Payer: Medicare Other | Source: Ambulatory Visit | Attending: Internal Medicine | Admitting: Internal Medicine

## 2021-12-27 DIAGNOSIS — K5730 Diverticulosis of large intestine without perforation or abscess without bleeding: Secondary | ICD-10-CM | POA: Diagnosis not present

## 2021-12-27 DIAGNOSIS — R161 Splenomegaly, not elsewhere classified: Secondary | ICD-10-CM | POA: Diagnosis not present

## 2021-12-27 DIAGNOSIS — D7389 Other diseases of spleen: Secondary | ICD-10-CM

## 2021-12-27 MED ORDER — GADOPICLENOL 0.5 MMOL/ML IV SOLN
8.0000 mL | Freq: Once | INTRAVENOUS | Status: AC | PRN
Start: 2021-12-27 — End: 2021-12-27
  Administered 2021-12-27: 8 mL via INTRAVENOUS

## 2022-01-03 IMAGING — CR DG CHEST 2V
2 series · 2 of 2 positions shown · non-contrast
Comparison: 01/16/2012

CLINICAL DATA: History of transient cerebral ischemia

EXAM:
CHEST - 2 VIEW

[w chest pa]
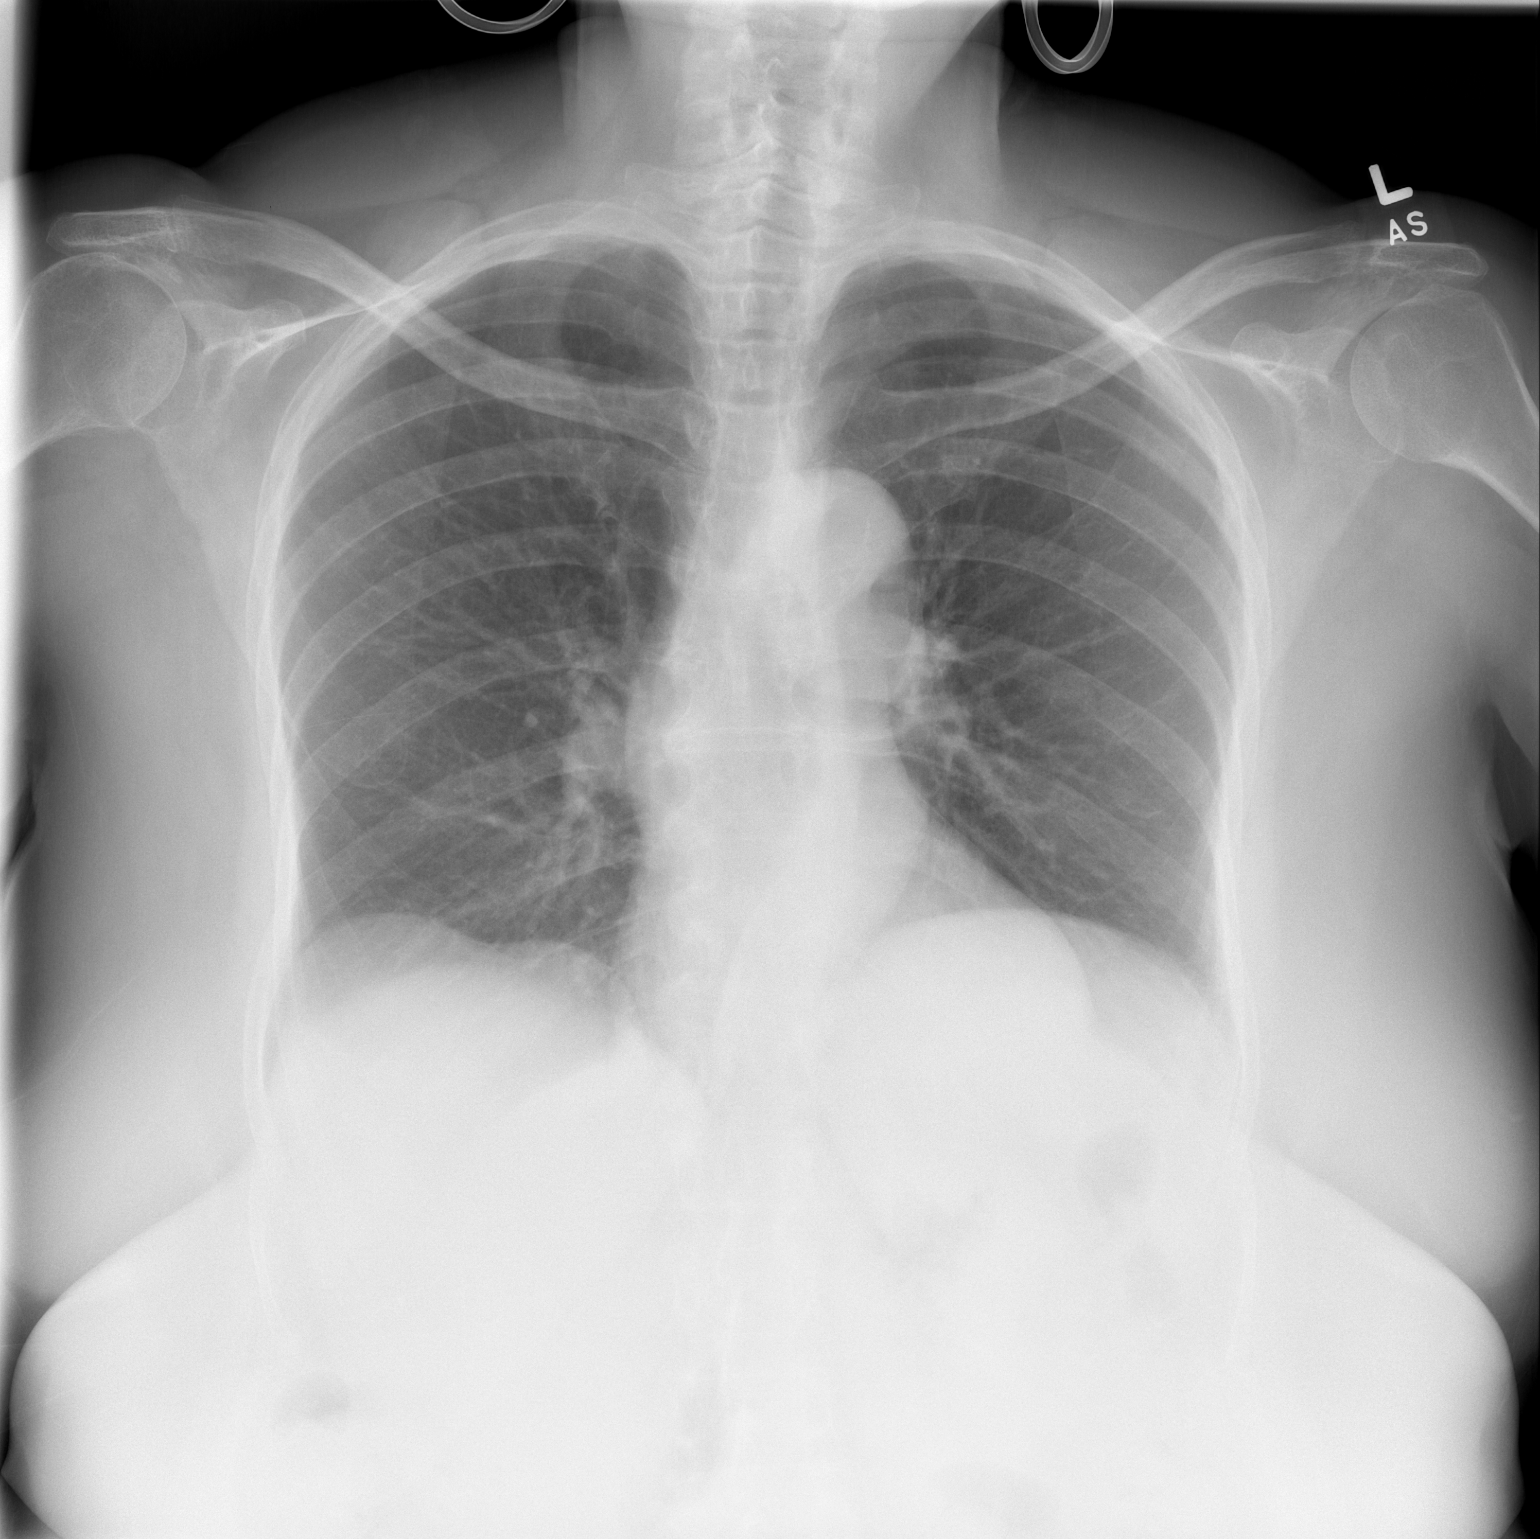

[w chest lat *]
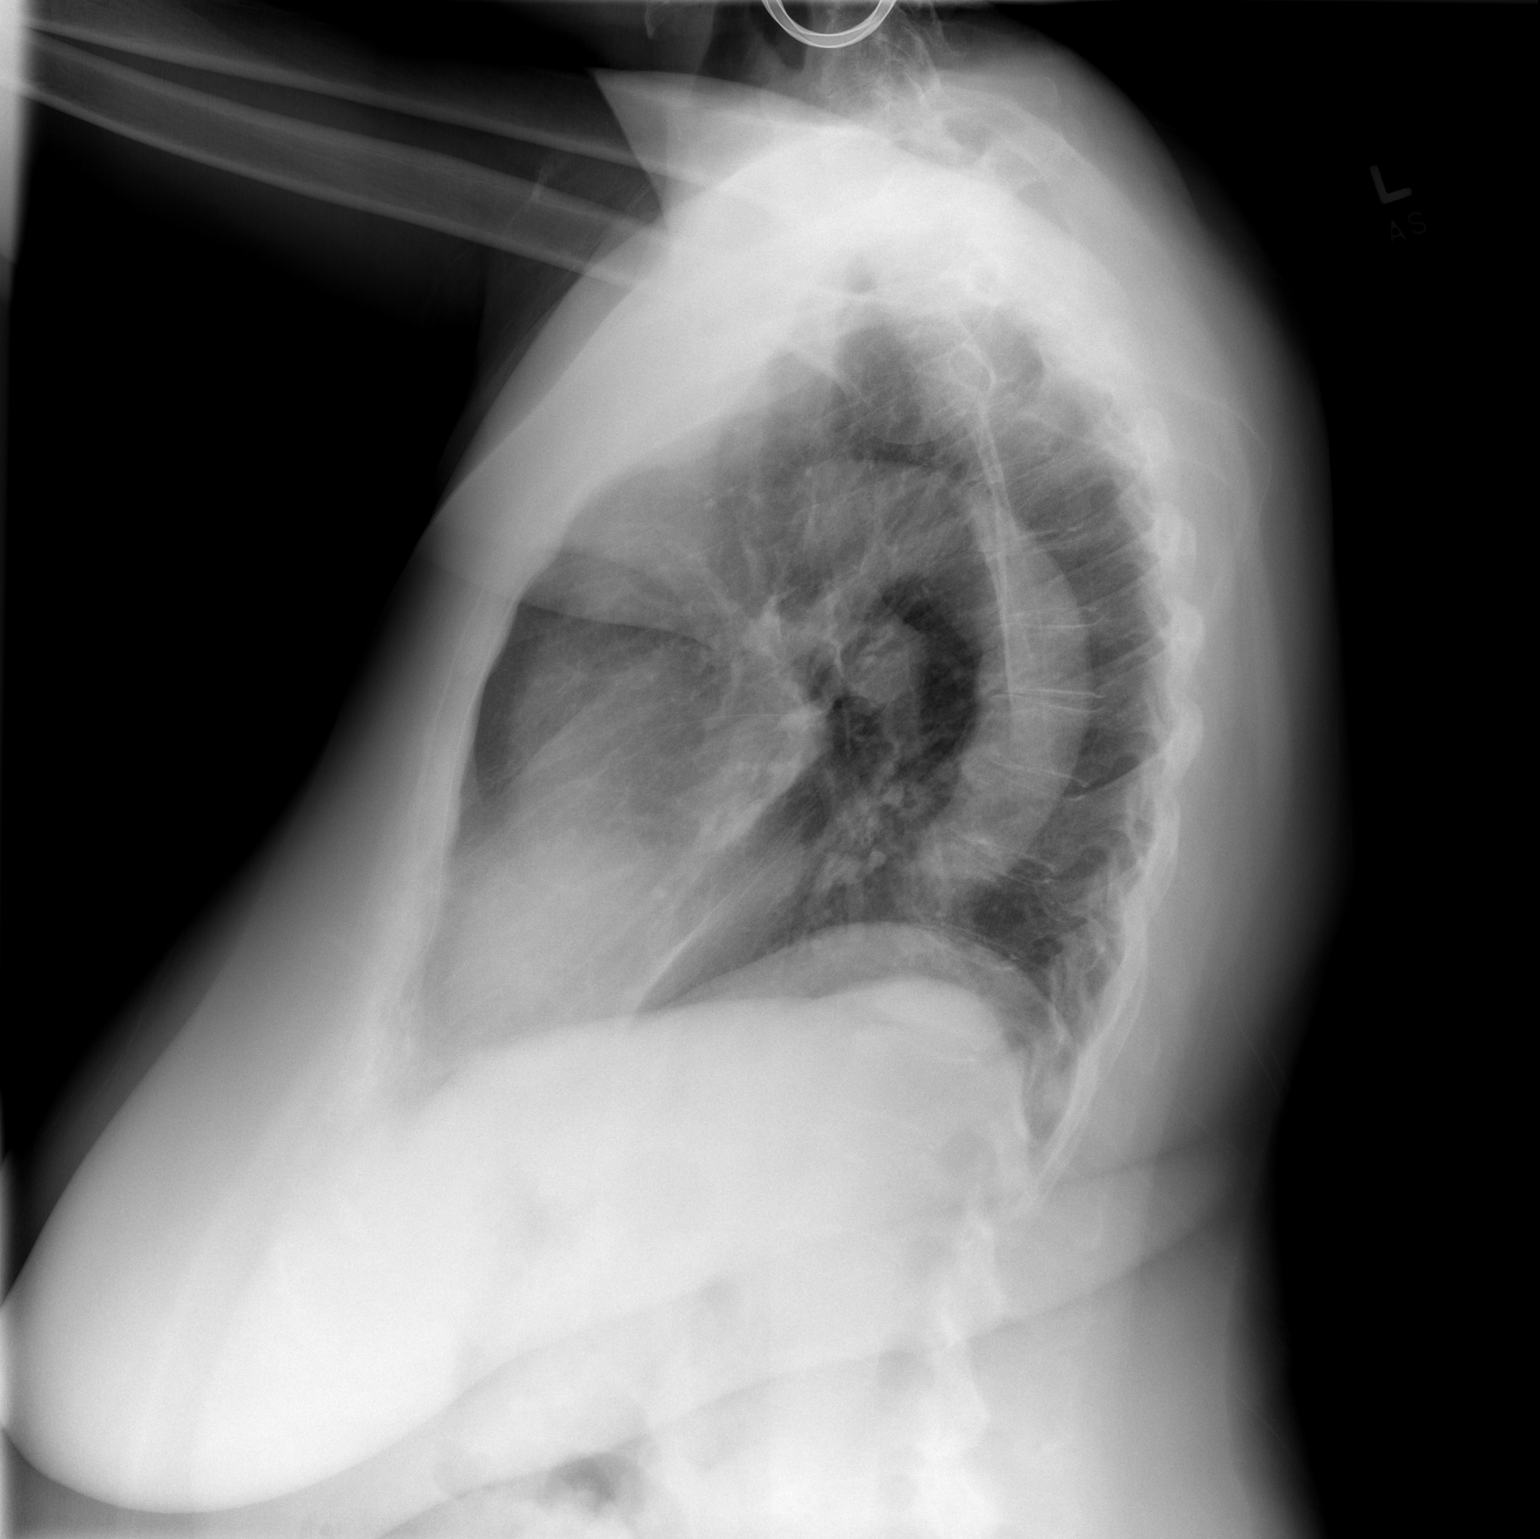

[2 of 2 positions shown; findings below may reference images not displayed]

FINDINGS: Normal heart size. Mild aortic tortuosity. There is no edema,
consolidation, effusion, or pneumothorax.
IMPRESSION: No active cardiopulmonary disease.

## 2022-02-08 DIAGNOSIS — E785 Hyperlipidemia, unspecified: Secondary | ICD-10-CM | POA: Diagnosis not present

## 2022-02-08 DIAGNOSIS — R7303 Prediabetes: Secondary | ICD-10-CM | POA: Diagnosis not present

## 2022-02-08 DIAGNOSIS — Z0001 Encounter for general adult medical examination with abnormal findings: Secondary | ICD-10-CM | POA: Diagnosis not present

## 2022-02-15 DIAGNOSIS — Z23 Encounter for immunization: Secondary | ICD-10-CM | POA: Diagnosis not present

## 2022-02-15 DIAGNOSIS — Z0001 Encounter for general adult medical examination with abnormal findings: Secondary | ICD-10-CM | POA: Diagnosis not present

## 2022-02-15 DIAGNOSIS — E1165 Type 2 diabetes mellitus with hyperglycemia: Secondary | ICD-10-CM | POA: Diagnosis not present

## 2022-02-15 DIAGNOSIS — R3129 Other microscopic hematuria: Secondary | ICD-10-CM | POA: Diagnosis not present

## 2022-02-15 DIAGNOSIS — Z13820 Encounter for screening for osteoporosis: Secondary | ICD-10-CM | POA: Diagnosis not present

## 2022-02-15 DIAGNOSIS — Z1231 Encounter for screening mammogram for malignant neoplasm of breast: Secondary | ICD-10-CM | POA: Diagnosis not present

## 2022-02-25 DIAGNOSIS — E1165 Type 2 diabetes mellitus with hyperglycemia: Secondary | ICD-10-CM | POA: Diagnosis not present

## 2022-03-09 DIAGNOSIS — E1165 Type 2 diabetes mellitus with hyperglycemia: Secondary | ICD-10-CM | POA: Diagnosis not present

## 2022-03-19 ENCOUNTER — Encounter: Payer: Medicare HMO | Attending: Internal Medicine | Admitting: Dietician

## 2022-03-19 VITALS — Ht 65.0 in | Wt 167.0 lb

## 2022-03-19 DIAGNOSIS — E119 Type 2 diabetes mellitus without complications: Secondary | ICD-10-CM | POA: Diagnosis present

## 2022-03-21 ENCOUNTER — Encounter: Payer: Self-pay | Admitting: Dietician

## 2022-03-21 NOTE — Progress Notes (Signed)
Patient was seen on 03/19/2021 for the first of a series of three diabetes self-management courses at the [facility].  Patient Education Plan per assessed needs and concerns is to attend three course education program for Diabetes Self Management Education.  A1C was 6.1% on 02/15/2022 decreased from 6.6% 02/08/2022 from MD notes  The following learning objectives were met by the patient during this class: Describe diabetes, types of diabetes and pathophysiology State some common risk factors for diabetes Defines the role of glucose and insulin Describe the relationship between diabetes and cardiovascular and other risks State the members of the Healthcare Team States the rationale for glucose monitoring and when to test State their individual [place] the importance of logging glucose readings and how to interpret the readings Identifies A1C target Explain the correlation between A1c and eAG values State symptoms and treatment of high blood glucose and low blood glucose Explain proper technique for glucose testing and identify proper sharps disposal  Handouts given during class include: How to Thrive:  A Guide for Your Journey with Diabetes by the ADA Meal Plan Card and carbohydrate content list Dietary intake form Low Sodium Flavoring Tips Types of Fats Dining Out Label reading Snack list The diabetes portion plate Diabetes Resources A1c to eAG Conversion Chart Blood Glucose Log Diabetes Recommended Care Schedule Support Group Diabetes Success Plan Core Class Satisfaction Survey   Follow-Up Plan: Attend core 2

## 2022-03-26 ENCOUNTER — Ambulatory Visit: Payer: Medicare Other

## 2022-04-02 ENCOUNTER — Encounter: Payer: Self-pay | Admitting: Dietician

## 2022-04-02 ENCOUNTER — Encounter: Payer: Medicare HMO | Admitting: Dietician

## 2022-04-02 DIAGNOSIS — E119 Type 2 diabetes mellitus without complications: Secondary | ICD-10-CM | POA: Diagnosis not present

## 2022-04-02 NOTE — Progress Notes (Signed)
Patient was seen on 04/01/22 for the second of a series of three diabetes self-management courses at the [facility]. The following learning objectives were met by the patient during this class:  Describe the role of different macronutrients on glucose Explain how carbohydrates affect blood glucose State what foods contain the most carbohydrates Demonstrate carbohydrate counting Demonstrate how to read Nutrition Facts food label Describe effects of various fats on heart health Describe the importance of good nutrition for health and healthy eating strategies Describe techniques for managing your shopping, cooking and meal planning List strategies to follow meal plan when dining out Describe the effects of alcohol on glucose and how to use it safely  Goals:  Follow Diabetes Meal Plan as instructed  Aim to spread carbs evenly throughout the day  Aim for 3 meals per day and snacks as needed Include lean protein foods to meals/snacks  Monitor glucose levels as instructed by your doctor   Follow-Up Plan: Attend Core 3 Work towards following your personal food plan.     Patient was seen on 04/01/22 for the third of a series of three diabetes self-management courses at the [facility].   State the amount of activity recommended for healthy living Describe activities suitable for individual needs Identify ways to regularly incorporate activity into daily life Identify barriers to activity and ways to over come these barriers Identify diabetes medications being personally used and their primary action for lowering glucose and possible side effects Describe role of stress on blood glucose and develop strategies to address psychosocial issues Identify diabetes complications and ways to prevent them Explain how to manage diabetes during illness Evaluate success in meeting personal goal Establish 2-3 goals that they will plan to  diligently work on  Goals:  I will count my carb choices at most meals and snacks Lose 10 lbs I will be active 30 minutes or more 2 times a week I will take my diabetes medications as scheduled I will eat less unhealthy fats by eating less red meat I will test my glucose at least 5 times a day, 7 days a week I will look at patterns in my record book at least 5 days a month To help manage stress I will  exercise at least 5 times a week  Your patient has identified these potential barriers to change:  NA  Your patient has identified their diabetes self-care support plan as  [facility] Support Group    Plan:  Attend Support Group as desired

## 2022-04-04 ENCOUNTER — Ambulatory Visit: Payer: Self-pay

## 2022-04-25 ENCOUNTER — Encounter: Payer: Medicare HMO | Attending: Internal Medicine | Admitting: Dietician

## 2022-04-25 ENCOUNTER — Encounter: Payer: Self-pay | Admitting: Dietician

## 2022-04-25 DIAGNOSIS — E119 Type 2 diabetes mellitus without complications: Secondary | ICD-10-CM | POA: Diagnosis present

## 2022-04-25 NOTE — Patient Instructions (Addendum)
[place] your friend or sister at the [facility] 3 days per week.  Consider resuming the exercises that you were doing at home  Continue mindful eating Continue label reading Find ways to increase your vegetable intake.   Your mind on... podcast with Drs. Marval Regal and Ardeen Garland

## 2022-04-25 NOTE — Progress Notes (Signed)
Patient is here today alone.  Appointment start:  0950 Appointment End:  1100  Patient attended Diabetes Core Classes 1-3 between 03/19/2022 and 04/02/2022 at [facility]. The purpose of the meeting today is to review information learned during those classes as well as review patient application and goals.   What are one or two positive things that you are doing right now to manage your diabetes?   Mindful eating choices She started to eat dinner a little later.  Avoids snacks before bed unless her blood glucose is below 100.  No further lows during the night since making these changes. Larger breakfast, medium lunch, light dinner. Has not been watching her sodium.  A renal dietitian spoke at her church who discussed sodium, label reading and cooking tips.  What questions do you have today?  Behavioral modification, motivation  History:  Type 2 Diabetes, HTN, HLD A1C:  6.1% 02/2023 Medications include:  none for diabetes Sleep:  good-very good Weight:   165 lbs 04/25/22 185 lbs 12/2021 163 lbs 09/2021 175 lbs 09/12/2020  Blood Glucose:  Dexcom G7 with phone - pays for this out of pocket CGM Results from download: 04/26/2023  % Time CGM active:   >85 %   (Goal >70%)  Average glucose:   116 mg/dL for 30 days  Glucose management indicator:   6.1 %  Time in range (70-180 mg/dL):   96 %   (Goal >70%)  Time High (181-250 mg/dL):   3 %   (Goal < 25%)  Time Very High (>250 mg/dL):    <1 %   (Goal < 5%)  Time Low (54-69 mg/dL):   <1 %   (Goal <4%)  Time Very Low (<54 mg/dL):   <1 %   (Goal <1%)   Social History:  Lives alone, very active in her church, mission groups, sorority, helps sister and others. Exercise:  none due to time, "laziness", traditionally puts other people and things above her.  Wants to get back to the [facility] which is free and 3 minutes from her home.  Considering having an accountability buddy.  Neighbor goes to [employer] many days per week.  24  hour diet recall: valentines day Breakfast:  skips at times OR low sugar breakfast bar from [employer] OR bacon and egg sandwich Snack:   Lunch:   Snack:  low sugar breakfast bar occasional Dinner:  BBQ sandwich OR stuffed chicken breast, 1/2 sweet potato, broccoli Snack:  small slice pound cake with strawberries (Dexcom was 158 after) Beverages:  sparkling water   Specific focus but not limited to the following: Review of blood glucose monitoring and interpretation including the recommended target ranges and Hgb A1c.  Importance of regularly scheduled meals/snacks, and meal planning to improve quality of diet. Review of the effects of physical activity on glucose levels and long-term glucose control.  Recommended goal of 150 minutes of physical activity/week. Discussion of strategies to manage obstacles to diabetes management. Review of short-term complications: hyper- and hypo-glycemia (causes, symptoms, and treatment options) Review of prevention, detection, and treatment of long-term complications.  Discussion of the role of prolonged elevated glucose levels on body systems. Discussed that she could use one Dexcom Sensor once a month to help with behavior modification and decrease overall cost of the sensors.  Continuing Goals: [place] your friend or sister at the [facility] 3 days per week.  Consider resuming the exercises that you were doing at home  Continue mindful eating Continue label reading Find ways to increase your vegetable intake. Your mind on... podcast with Drs. Marval Regal and Ardeen Garland  Future Follow up:  prn  Patient states that she will follow up with a RD at church.

## 2022-06-28 ENCOUNTER — Other Ambulatory Visit: Payer: Self-pay | Admitting: Neurological Surgery

## 2022-06-28 DIAGNOSIS — M4802 Spinal stenosis, cervical region: Secondary | ICD-10-CM

## 2022-07-07 ENCOUNTER — Ambulatory Visit (INDEPENDENT_AMBULATORY_CARE_PROVIDER_SITE_OTHER): Payer: Medicare HMO

## 2022-07-07 DIAGNOSIS — M4802 Spinal stenosis, cervical region: Secondary | ICD-10-CM | POA: Diagnosis not present

## 2022-07-29 ENCOUNTER — Telehealth: Payer: Self-pay | Admitting: Neurology

## 2022-07-29 ENCOUNTER — Other Ambulatory Visit: Payer: Self-pay | Admitting: Neurology

## 2022-07-29 DIAGNOSIS — R270 Ataxia, unspecified: Secondary | ICD-10-CM

## 2022-07-29 DIAGNOSIS — G35 Multiple sclerosis: Secondary | ICD-10-CM

## 2022-07-29 DIAGNOSIS — R29898 Other symptoms and signs involving the musculoskeletal system: Secondary | ICD-10-CM

## 2022-07-29 DIAGNOSIS — G379 Demyelinating disease of central nervous system, unspecified: Secondary | ICD-10-CM

## 2022-07-29 NOTE — Telephone Encounter (Signed)
[employer] Berkley Harvey: [id number] exp. 07/29/22-01/25/23 sent to med center [facility] [phone]

## 2022-07-29 NOTE — Telephone Encounter (Signed)
Spoke with patient, I reviewed MRI of the cervical spine with [name]. Unclear if could be MS. Very unlikely in a 77 year old to have new plaques, and after reviewing the last 2 MRI c-spine we don;t think the T1 lesion mentioned is new. But thank you to our [facility] colleagues for ordering C/T/L w/wo contrast. If we could ask please send her to [facility] again or any [facility] center so we can have the images in EPIC. We also ordered repeat MRI brain w/wo contrast MS protocol to [facility] facility as well. When she has scheduled the MRIs she will let me know and [name] will see her within a few days after ([name] our MS doctor). Thanks to our colleagues.

## 2022-09-02 ENCOUNTER — Ambulatory Visit (INDEPENDENT_AMBULATORY_CARE_PROVIDER_SITE_OTHER): Payer: Medicare HMO

## 2022-09-02 DIAGNOSIS — R270 Ataxia, unspecified: Secondary | ICD-10-CM | POA: Diagnosis not present

## 2022-09-02 DIAGNOSIS — G379 Demyelinating disease of central nervous system, unspecified: Secondary | ICD-10-CM | POA: Diagnosis not present

## 2022-09-02 DIAGNOSIS — G35 Multiple sclerosis: Secondary | ICD-10-CM

## 2022-09-02 DIAGNOSIS — R29898 Other symptoms and signs involving the musculoskeletal system: Secondary | ICD-10-CM

## 2022-09-02 DIAGNOSIS — G35D Multiple sclerosis, unspecified: Secondary | ICD-10-CM

## 2022-09-02 MED ORDER — GADOBUTROL 1 MMOL/ML IV SOLN
7.5000 mL | Freq: Once | INTRAVENOUS | Status: AC | PRN
  Administered 2022-09-02: 7.5 mL via INTRAVENOUS

## 2022-09-03 NOTE — Telephone Encounter (Signed)
Patient called today. Her MRI brain was completed yesterday. She has been scheduled with Dr Epimenio Foot for next Wed 7/3 at 9 AM.

## 2022-09-11 ENCOUNTER — Ambulatory Visit: Payer: Medicare HMO | Admitting: Neurology

## 2022-09-11 ENCOUNTER — Encounter: Payer: Self-pay | Admitting: Neurology

## 2022-09-11 ENCOUNTER — Telehealth: Payer: Self-pay | Admitting: Neurology

## 2022-09-11 VITALS — BP 148/94 | HR 61 | Ht 65.0 in | Wt 161.0 lb

## 2022-09-11 DIAGNOSIS — R200 Anesthesia of skin: Secondary | ICD-10-CM

## 2022-09-11 DIAGNOSIS — R202 Paresthesia of skin: Secondary | ICD-10-CM

## 2022-09-11 DIAGNOSIS — G379 Demyelinating disease of central nervous system, unspecified: Secondary | ICD-10-CM | POA: Diagnosis not present

## 2022-09-11 DIAGNOSIS — R269 Unspecified abnormalities of gait and mobility: Secondary | ICD-10-CM | POA: Diagnosis not present

## 2022-09-11 DIAGNOSIS — R29898 Other symptoms and signs involving the musculoskeletal system: Secondary | ICD-10-CM | POA: Diagnosis not present

## 2022-09-11 DIAGNOSIS — R3915 Urgency of urination: Secondary | ICD-10-CM

## 2022-09-11 DIAGNOSIS — R270 Ataxia, unspecified: Secondary | ICD-10-CM

## 2022-09-11 NOTE — Telephone Encounter (Signed)
Referral sent in Epic  to [facility]

## 2022-09-11 NOTE — Progress Notes (Signed)
[facility]  PATIENT: Melinda Mckay DOB: 09/30/1945  REFERRING DOCTOR OR PCP: 05/21/2020 SOURCE: Patient, note from Dr. Lucia Gaskins, imaging and lab reports, MRI images personally reviewed.  _________________________________   HISTORICAL  CHIEF COMPLAINT:  Chief Complaint  Patient presents with   Room 11    Pt is here with her Daughter. Pt states that her RLS isn't a problem anymore. Pt states that her balance is off.    HISTORY OF PRESENT ILLNESS:  Melinda Mckay is a 77 yo woman with   She was found on imaging studies to have multiple foci within the spinal cord worrisome for MS.   She also has foci in the brain but these have an appearance that is more typical for chronic microvascular ischemic change.    She had a TIA in 04/2020.   She had right sided tingling/numbness including her face.  She noted she could still walk and could talk but made her way back to her car.  She then drove to an urgent care.  Her BP was elevated and she was positive for Covid-19.  She was taken to [facility] ED.   MRI showed cahnges c/w SVID and she had a possible tiny spot on DWI.    Iitial symptoms lasted 5-10 minutes but then she had intermittent Numbness over the next couple weeks.   She noted balance issues later in 2022 and had MRI of the brain and cervical spine.   By my interpretation she had several foci in the cervical spinal cord c/w MS chronic demyelination though read as normal spinal cord ad multilevel DJD without critical stenosis.     Currently, she is off balanced and rarely uses a cane.  She veers when she walks probably more to the right.   She has right sided weakness.   She still gets right sided intermittent numbness and the right hand is cold and tingling and she wears a glove.    She has urinary frequency and occasional urge incontinence and 3 times nocturia.   Vision is doing well.      Imaging: MRI of the cervical spine 07/07/2022 showed T2 hyperintense foci  posteriorly to the right adjacent to C2, to the right adjacent to C3 posteriorly adjacent to C3, posterolaterally to the right adjacent to C4, posterolaterally to the left adjacent to C5, towards the right adjacent to C6 and posterolaterally to the right adjacent to T1.  The T1 focus is not clearly seen on the previous MRI from 05/21/2020 while other foci seem unchanged.  There are multilevel degenerative changes noted.  There is mild spinal stenosis at C3-C4, C5-C6 and C6-C7.  There is foraminal narrowing that could affect the nerve roots bilaterally at C5-C6 and C6-C7 and to the left at C4-C5  MRI of the brain 09/02/2022 shows multiple T2/FLAIR hyperintense foci.  The vast majority are in the deep white matter with confluencies in the frontal lobes and the periatrial white matter.  Foci also noted in the pons.  There are very few periventricular or juxtacortical foci.  Most of the changes are likely due to chronic microvascular ischemic disease rather than demyelination.  There is very slight progression compared to the MRI from 04/24/2020   REVIEW OF SYSTEMS: Constitutional: No fevers, chills, sweats, or change in appetite Eyes: No visual changes, double vision, eye pain Ear, nose and throat: No hearing loss, ear pain, nasal congestion, sore throat Cardiovascular: No chest pain, palpitations Respiratory:  No shortness of breath at rest or  with exertion.   No wheezes GastrointestinaI: No nausea, vomiting, diarrhea, abdominal pain, fecal incontinence Genitourinary:  No dysuria, urinary retention or frequency.  No nocturia. Musculoskeletal:  No neck pain, back pain Integumentary: No rash, pruritus, skin lesions Neurological: as above Psychiatric: No depression at this time.  No anxiety Endocrine: No palpitations, diaphoresis, change in appetite, change in weigh or increased thirst Hematologic/Lymphatic:  No anemia, purpura, petechiae. Allergic/Immunologic: No itchy/runny eyes, nasal congestion,  recent allergic reactions, rashes  ALLERGIES: No Known Allergies  HOME MEDICATIONS:  Current Outpatient Medications:    acetaminophen (TYLENOL) 500 MG tablet, Take 500 mg by mouth every 6 (six) hours as needed for mild pain., Disp: , Rfl:    albuterol (PROAIR HFA) 108 (90 Base) MCG/ACT inhaler, 2 puffs every 4 hours as needed only  if your can't catch your breath (Patient taking differently: Inhale 2 puffs into the lungs every 4 (four) hours as needed for wheezing or shortness of breath.), Disp: 18 g, Rfl: 2   ALPRAZolam (XANAX) 1 MG tablet, Take 1 mg by mouth 2 (two) times daily as needed for anxiety., Disp: , Rfl:    amLODipine (NORVASC) 10 MG tablet, Take 5 mg by mouth daily., Disp: , Rfl:    beclomethasone (QVAR REDIHALER) 80 MCG/ACT inhaler, Inhale 2 puffs into the lungs daily., Disp: 10.6 g, Rfl: 11   calcium-vitamin D (OSCAL WITH D) 500-200 MG-UNIT tablet, Take 1 tablet by mouth daily with breakfast., Disp: , Rfl:    cholecalciferol (VITAMIN D3) 25 MCG (1000 UNIT) tablet, Take 1,000 Units by mouth daily., Disp: , Rfl:    clopidogrel (PLAVIX) 75 MG tablet, Take 1 tablet (75 mg total) by mouth daily., Disp: 30 tablet, Rfl: 0   clopidogrel (PLAVIX) 75 MG tablet, Take 1 tablet by mouth daily., Disp: , Rfl:    ezetimibe (ZETIA) 10 MG tablet, TAKE 1 TABLET BY MOUTH ONCE DAILY FOR 90 DAYS, Disp: , Rfl:    hydrochlorothiazide (HYDRODIURIL) 25 MG tablet, Take 25 mg by mouth daily., Disp: , Rfl:    hydrOXYzine (ATARAX/VISTARIL) 25 MG tablet, Take 25 mg by mouth at bedtime as needed for itching., Disp: , Rfl:    losartan (COZAAR) 100 MG tablet, Take 100 mg by mouth daily., Disp: , Rfl:    metFORMIN (GLUCOPHAGE-XR) 500 MG 24 hr tablet, Take 500 mg by mouth 2 (two) times daily., Disp: , Rfl:    Omega-3 Fatty Acids (FISH OIL) 1000 MG CAPS, Take 1,000 mg by mouth daily., Disp: , Rfl:    potassium chloride SA (K-DUR,KLOR-CON) 20 MEQ tablet, Take 20 mEq by mouth daily., Disp: , Rfl:    rosuvastatin  (CRESTOR) 40 MG tablet, Take 40 mg by mouth daily., Disp: , Rfl:   PAST MEDICAL HISTORY: Past Medical History:  Diagnosis Date   Anxiety    Asthma    Diabetes mellitus without complication (HCC)    Edema of lower extremity    Hyperlipidemia    Hypertension    Prediabetes    Restless legs     PAST SURGICAL HISTORY: Past Surgical History:  Procedure Laterality Date   bone spur removal Right    BUNIONECTOMY Bilateral 2005   PARTIAL HYSTERECTOMY  2008   TUBAL LIGATION  1982    FAMILY HISTORY: Family History  Problem Relation Age of Onset   Cancer Sister        KIDNEY    Allergies Sister    Asthma Sister    Allergies Brother    Diabetes Brother  Hypertension Brother    Asthma Brother     SOCIAL HISTORY: Social History   Socioeconomic History   Marital status: Widowed    Spouse name: Not on file   Number of children: 2   Years of education: Not on file   Highest education level: Bachelor's degree (e.g., BA, AB, BS)  Occupational History   Occupation: RETIRED   Tobacco Use   Smoking status: Former    Packs/day: 0.40    Years: 20.00    Additional pack years: 0.00    Total pack years: 8.00    Types: Cigarettes    Quit date: 03/11/1993    Years since quitting: 29.5    Passive exposure: Never   Smokeless tobacco: Never   Tobacco comments:    PT STATES ABOUT 1 PACK A WEEK   Vaping Use   Vaping Use: Never used  Substance and Sexual Activity   Alcohol use: No    Comment: quit 2015   Drug use: No   Sexual activity: Not on file  Other Topics Concern   Not on file  Social History Narrative   Lives at home alone   Right handed   Drinks 1 cup of coffee daily   Social Determinants of Health   Financial Resource Strain: Not on file  Food Insecurity: Not on file  Transportation Needs: Not on file  Physical Activity: Not on file  Stress: Not on file  Social Connections: Not on file  Intimate Partner Violence: Not on file       PHYSICAL EXAM  Vitals:    09/11/22 0845  BP: (!) 148/94  Pulse: 61  Weight: 161 lb (73 kg)  Height: 5\' 5"  (1.651 m)    Body mass index is 26.79 kg/m.   General: The patient is well-developed and well-nourished and in no acute distress  HEENT:  Head is /AT.  Sclera are anicteric.  Funduscopic exam shows normal optic discs and retinal vessels.  Neck: No carotid bruits are noted.  The neck is nontender.  Cardiovascular: The heart has a regular rate and rhythm with a normal S1 and S2. There were no murmurs, gallops or rubs.    Skin: Extremities are without rash or  edema.  Musculoskeletal:  Back is nontender  Neurologic Exam  Mental status: The patient is alert and oriented x 3 at the time of the examination. The patient has apparent normal recent and remote memory, with an apparently normal attention span and concentration ability.   Speech is normal.  Cranial nerves: Extraocular movements are full. Pupils are equal, round, and reactive to light and accomodation.  Visual fields are full.  Facial symmetry is present. Slight reduced lower face sensation to temperature.  .Facial strength is normal.  Trapezius and sternocleidomastoid strength is normal. No dysarthria is noted.  The tongue is midline, and the patient has symmetric elevation of the soft palate. No obvious hearing deficits are noted.  Motor:  Muscle bulk is normal.   Tone is normal. Strength is  5 / 5 ion the left but 4+/5 strength in mos right arm muscles,right iliopsoas and right toe extension.   Sensory: Sensory testing is intact to pinprick, soft touch and vibration sensation in all 4 extremities.  Coordination: Cerebellar testing reveals good finger-nose-finger and heel-to-shin bilaterally.  Gait and station: Station is normal.   Gait is normal. Tandem gait is normal. Romberg is negative.   Reflexes: Deep tendon reflexes are symmetric and normal in arms and increased at knees with  spread.   No ankle clonus.  Plantar responses are  flexor.    DIAGNOSTIC DATA (LABS, IMAGING, TESTING) - I reviewed patient records, labs, notes, testing and imaging myself where available.  Lab Results  Component Value Date   WBC 5.6 04/24/2020   HGB 12.7 04/24/2020   HCT 38.9 04/24/2020   MCV 87.8 04/24/2020   PLT 250 04/24/2020      Component Value Date/Time   NA 141 04/24/2020 0410   K 3.8 04/24/2020 0410   CL 104 04/24/2020 0410   CO2 28 04/24/2020 0410   GLUCOSE 113 (H) 04/24/2020 0410   BUN 16 04/24/2020 0410   CREATININE 0.72 04/24/2020 0410   CALCIUM 9.7 04/24/2020 0410   PROT 6.8 04/24/2020 0410   ALBUMIN 3.1 (L) 04/24/2020 0410   AST 19 04/24/2020 0410   ALT 15 04/24/2020 0410   ALKPHOS 54 04/24/2020 0410   BILITOT 0.6 04/24/2020 0410   GFRNONAA >60 04/24/2020 0410   GFRAA >60 02/28/2017 1340   Lab Results  Component Value Date   CHOL 171 04/24/2020   HDL 56 04/24/2020   LDLCALC 104 (H) 04/24/2020   TRIG 55 04/24/2020   CHOLHDL 3.1 04/24/2020   Lab Results  Component Value Date   HGBA1C 6.3 (H) 04/24/2020       ASSESSMENT AND PLAN  Gait disturbance - Plan: Ambulatory referral to Physical Therapy  Demyelinating disease of the spinal cord (HCC) - Plan: Ambulatory referral to Physical Therapy  Right leg weakness - Plan: Ambulatory referral to Physical Therapy  Ataxia  Numbness and tingling in right hand  Urinary urgency   I discussed with her daughter that I would classify her as probable MS.  The lesions in the spinal cord definitely very consistent with that diagnosis.  Most of the lesions in the brain actually have an appearance more consistent with chronic microvascular ischemic change but there could be some MS lesions among them.  To be more certain of the diagnosis, I would need to see spinal fluid consistent with MS or change over time consistent with MS (as opposed to changes consistent with microvascular ischemic change).  She will consider doing a lumbar puncture to more definitively  diagnose her.  Regardless, even if she has MS, at age 46, I would be reluctant to initiate therapy as risks can sometimes be higher than benefit later in the MS disease course.  We spent significant time discussing the typical course of MS which is relapsing remitting for a while before converting to secondary progressive MS (especially with spinal cord plaques) We also discussed that I believe it is probable that the events in February 22 were due to a new lesion in the spinal cord.  She apparently had been very well compensated before that time.  We will have her do some physical therapy. She is having some blood work with her primary care next week and I asked her to see if she can get vitamin D added and supplement if needed Stable return to see me in 6 months or sooner if there are new or worsening neurologic symptoms.     65-minute office visit with the majority of the time spent face-to-face for history and physical, discussion/counseling and decision-making.  Additional time with record review and documentation.   Sawsan Riggio A. Epimenio Foot, MD, [facility] 09/11/2022, 8:58 AM Certified in Neurology, Clinical Neurophysiology, Sleep Medicine and Neuroimaging  [facility] [address] [place], [place] [address] [phone]

## 2022-09-26 ENCOUNTER — Other Ambulatory Visit: Payer: Self-pay

## 2022-09-26 ENCOUNTER — Ambulatory Visit: Payer: Medicare HMO | Attending: Neurology | Admitting: Physical Therapy

## 2022-09-26 ENCOUNTER — Encounter: Payer: Self-pay | Admitting: Physical Therapy

## 2022-09-26 DIAGNOSIS — R2681 Unsteadiness on feet: Secondary | ICD-10-CM | POA: Diagnosis present

## 2022-09-26 DIAGNOSIS — R262 Difficulty in walking, not elsewhere classified: Secondary | ICD-10-CM | POA: Insufficient documentation

## 2022-09-26 DIAGNOSIS — G379 Demyelinating disease of central nervous system, unspecified: Secondary | ICD-10-CM | POA: Diagnosis not present

## 2022-09-26 DIAGNOSIS — M6281 Muscle weakness (generalized): Secondary | ICD-10-CM | POA: Insufficient documentation

## 2022-09-26 DIAGNOSIS — R269 Unspecified abnormalities of gait and mobility: Secondary | ICD-10-CM | POA: Insufficient documentation

## 2022-09-26 DIAGNOSIS — R29898 Other symptoms and signs involving the musculoskeletal system: Secondary | ICD-10-CM | POA: Diagnosis not present

## 2022-09-26 NOTE — Therapy (Signed)
OUTPATIENT PHYSICAL THERAPY NEURO EVALUATION   Patient Name: Melinda Mckay MRN: [id number] DOB:[birth year], 77 y.o., female Today's Date: 09/26/2022   PCP: Harvest Forest, MD REFERRING PROVIDER:   Asa Lente, MD    END OF SESSION:  PT End of Session - 09/26/22 1149     Visit Number 1    Number of Visits 16    Date for PT Re-Evaluation 11/21/22    Authorization Type [employer]    Progress Note Due on Visit 10    PT Start Time 1150    PT Stop Time 1230    PT Time Calculation (min) 40 min    Equipment Utilized During Treatment Gait belt    Activity Tolerance Patient tolerated treatment well    Behavior During Therapy WFL for tasks assessed/performed             Past Medical History:  Diagnosis Date   Anxiety    Asthma    Diabetes mellitus without complication (HCC)    Edema of lower extremity    Hyperlipidemia    Hypertension    Prediabetes    Restless legs    Past Surgical History:  Procedure Laterality Date   bone spur removal Right    BUNIONECTOMY Bilateral 2005   PARTIAL HYSTERECTOMY  2008   TUBAL LIGATION  1982   Patient Active Problem List   Diagnosis Date Noted   Cerebrovascular accident (CVA) due to occlusion of small artery (HCC) 10/30/2020   Numbness and tingling in right hand 10/30/2020   Acute stroke due to ischemia (HCC) 06/29/2020   Snoring 06/29/2020   Sleep apnea in adult 06/29/2020   Mild intermittent asthma 06/29/2020   Hypokalemia 04/24/2020   Lab test positive for detection of COVID-19 virus 04/24/2020   TIA (transient ischemic attack) 04/23/2020   Diverticulosis of colon 01/16/2018   Internal hemorrhoids 01/16/2018   Tubular adenoma 01/16/2018   Paresthesias 04/28/2017   Anxiety disorder 11/05/2016   Edema of lower extremity 11/05/2016   Essential hypertension 11/05/2016   Hyperlipidemia 11/05/2016   Prediabetes 11/05/2016   Restless legs 11/05/2016   Mild persistent asthma in adult without complication  12/12/2011    ONSET DATE: ~Feb 2022  REFERRING DIAG:  G37.9 (ICD-10-CM) - Demyelinating disease of the spinal cord (HCC)  R29.898 (ICD-10-CM) - Right leg weakness  R26.9 (ICD-10-CM) - Gait disturbance    THERAPY DIAG:  Muscle weakness (generalized)  Difficulty in walking, not elsewhere classified  Unsteadiness on feet  Rationale for Evaluation and Treatment: Rehabilitation  SUBJECTIVE:  SUBJECTIVE STATEMENT: Pt reports she saw [name] (Neurologist) on 09/09/22. Pt had MRI and was diagnosed with MS. Pt is to have a spinal tap to decide further management. Pt reports increased R UE and LE weakness since February 2022 when she initially thought she had a TIA. As time went on, pt reports increased complications on her R side. Pt states she is working with a Systems analyst and silver sneakers (2x/wk). Has started water aerobics in early July but has not been consistent due to vacation and schedule. Pt does note some aching bilat hip pain that can radiate when she's in bed but otherwise no other abnormal sensations or pain. She has a cane but does not use all the time and tries not to use regularly. Pt accompanied by: self  PERTINENT HISTORY: From Dr. [name] referral notes: 77 yo woman with probable MS and gait disorder and minimal right weakness   PAIN:  Are you having pain? None  PRECAUTIONS: None  RED FLAGS: None   WEIGHT BEARING RESTRICTIONS: No  FALLS: Has patient fallen in last 6 months? No  LIVING ENVIRONMENT: Lives with: lives alone; has daughters (live in [place]), 3 sisters in [place] Lives in: House/apartment Stairs: No Has upstairs but doesn't need to go up there Has following equipment at home: Single point cane, 4 prong cane, walker  PLOF: Independent  PATIENT GOALS:  Decrease use of cane, strengthen bilat LEs  OBJECTIVE:   DIAGNOSTIC FINDINGS: Recent MRI 09/02/22: IMPRESSION: Findings consistent with the clinical diagnosis of multiple sclerosis. Compared to prior MRI, there is interval progression of the T2 hyperintense lesions in the bilateral corona radiata and genu of the corpus callosum. No focus of abnormal contrast enhancement to suggest active demyelination.  COGNITION: Overall cognitive status: Within functional limits for tasks assessed   SENSATION: R hand cold and numb  EDEMA:  none  MUSCLE TONE: None noted on eval   POSTURE: No Significant postural limitations  LOWER EXTREMITY ROM:     Active  Right Eval Left Eval  Hip flexion 85 >100  Hip extension    Hip abduction    Hip adduction    Hip internal rotation    Hip external rotation    Knee flexion    Knee extension    Ankle dorsiflexion    Ankle plantarflexion    Ankle inversion    Ankle eversion     (Blank rows = not tested)  LOWER EXTREMITY MMT:    MMT Right Eval Left Eval  Hip flexion 3- 4  Hip extension 3+ 3+  Hip abduction 2+ 3+  Hip adduction    Hip internal rotation    Hip external rotation    Knee flexion 3+ 5  Knee extension 3+ 5  Ankle dorsiflexion    Ankle plantarflexion    Ankle inversion    Ankle eversion    (Blank rows = not tested)  BED MOBILITY:  Independent  TRANSFERS: Assistive device utilized: None  Sit to stand: Complete Independence Stand to sit: Complete Independence  STAIRS: Level of Assistance: CGA Stair Negotiation Technique: Alternating Pattern  with Single Rail on Left Number of Stairs: 5 steps x 3  Height of Stairs: 4"-6"   Comments: Mild LOB when attempted no rail with descent  GAIT: Gait pattern: step through pattern, decreased step length- Right, decreased stance time- Right, decreased ankle dorsiflexion- Right, Right foot flat, lateral lean- Right, and poor foot clearance- Right Increased R knee valgus during  stance Distance walked: Multiple laps  in clinic during DGI Assistive device utilized: None Level of assistance: CGA  FUNCTIONAL TESTS:  5 times sit to stand: 19.5 sec (needed use of UEs) Dynamic Gait Index: 16/24  [facility] PT Assessment - 09/26/22 0001       Standardized Balance Assessment   Standardized Balance Assessment Dynamic Gait Index      Dynamic Gait Index   Level Surface Mild Impairment    Change in Gait Speed Mild Impairment    Gait with Horizontal Head Turns Mild Impairment    Gait with Vertical Head Turns Mild Impairment    Gait and Pivot Turn Mild Impairment    Step Over Obstacle Mild Impairment    Step Around Obstacles Mild Impairment    Steps Mild Impairment    Total Score 16    DGI comment: 16/24             TODAY'S TREATMENT:                                                                                                                              DATE: 09/26/22  See HEP below  PATIENT EDUCATION: Education details: Exam findings, POC, initial HEP Person educated: Patient Education method: Explanation, Demonstration, and Handouts Education comprehension: verbalized understanding, returned demonstration, and needs further education  HOME EXERCISE PROGRAM: Access Code: [email or web address] URL: [email or web address] Date: 09/26/2022 Prepared by: Vernon Prey April Kirstie Peri  Exercises - Supine Bridge  - 1 x daily - 7 x weekly - 1 sets - 10 reps - Small Range Straight Leg Raise  - 1 x daily - 7 x weekly - 1 sets - 10 reps - Clamshell  - 1 x daily - 7 x weekly - 1 sets - 10 reps - Prone Knee Flexion  - 1 x daily - 7 x weekly - 1 sets - 10 reps  GOALS: Goals reviewed with patient? Yes  SHORT TERM GOALS: Target date: 10/24/2022   Pt will be ind with initial strength and balance HEP Baseline: Goal status: INITIAL  2.  Pt will be able to perform 5x STS without UE assist Baseline:  Goal status: INITIAL  3.  Pt will have improved DGI to  >/=18/24 Baseline:  Goal status: INITIAL   LONG TERM GOALS: Target date: 11/21/2022   Pt will be ind with management and progression of HEP and continue community wellness Baseline:  Goal status: INITIAL  2.  Pt will be able to perform 5x STS in </=13 sec to demo decreased fall risk Baseline:  Goal status: INITIAL  3.  Pt will be able to demo improved DGI to >/=22/24 for decreased fall risk Baseline:  Goal status: INITIAL  4.  Pt will be able to amb grocery store (>1000') independently Baseline:  Goal status: INITIAL  5.  Pt will be able to demo at least 4/5 bilat LE strength per pt's personal goals Baseline:  Goal status: INITIAL   ASSESSMENT:  CLINICAL IMPRESSION: Patient is a  77 y.o. F who was seen today for physical therapy evaluation and treatment for new diagnosis of MS with gait instability. Pt notes history of TIA in 2022 which may have been her initial MS exacerbation. Assessment significant for R LE weakness, decreased ROM, decreased balance with high fall risk, and decreased endurance affecting home and community mobility. Pt will benefit from PT to address these deficits for continued safe participation in recreational activities.   OBJECTIVE IMPAIRMENTS: Abnormal gait, decreased activity tolerance, decreased balance, decreased endurance, decreased mobility, difficulty walking, decreased ROM, decreased strength, impaired UE functional use, and pain.   ACTIVITY LIMITATIONS: carrying, lifting, standing, squatting, sleeping, stairs, and locomotion level  PARTICIPATION LIMITATIONS: meal prep, cleaning, shopping, community activity, and yard work  PERSONAL FACTORS: Fitness, Past/current experiences, and Time since onset of injury/illness/exacerbation are also affecting patient's functional outcome.   REHAB POTENTIAL: Good  CLINICAL DECISION MAKING: Evolving/moderate complexity  EVALUATION COMPLEXITY: Moderate  PLAN:  PT FREQUENCY: 1-2x/week (initially 2x/wk for  4 weeks with plans to wean to 1x/wk)  PT DURATION: 8 weeks  PLANNED INTERVENTIONS: Therapeutic exercises, Therapeutic activity, Neuromuscular re-education, Balance training, Gait training, Patient/Family education, Self Care, Joint mobilization, Stair training, Dry Needling, Electrical stimulation, Cryotherapy, Moist heat, Taping, Ionotophoresis 4mg /ml Dexamethasone, Manual therapy, and Re-evaluation  PLAN FOR NEXT SESSION: Assess UE (grip strength). Assess response to HEP. Work on Catering manager.    Izael Bessinger April Ma L Terricka Onofrio, PT 09/26/2022, 12:54 PM

## 2022-10-02 ENCOUNTER — Ambulatory Visit: Payer: Medicare HMO

## 2022-10-02 DIAGNOSIS — R2681 Unsteadiness on feet: Secondary | ICD-10-CM

## 2022-10-02 DIAGNOSIS — M6281 Muscle weakness (generalized): Secondary | ICD-10-CM | POA: Diagnosis not present

## 2022-10-02 DIAGNOSIS — R262 Difficulty in walking, not elsewhere classified: Secondary | ICD-10-CM

## 2022-10-02 NOTE — Therapy (Signed)
OUTPATIENT PHYSICAL THERAPY NEURO TREATMENT   Patient Name: Melinda Mckay MRN: [id number] DOB:1945/11/04, 77 y.o., female Today's Date: 10/02/2022   PCP: Harvest Forest, MD REFERRING PROVIDER:   Asa Lente, MD    END OF SESSION:  PT End of Session - 10/02/22 1442     Visit Number 2    Number of Visits 16    Date for PT Re-Evaluation 11/21/22    Authorization Type [employer]    Progress Note Due on Visit 10    PT Start Time 1445    PT Stop Time 1533    PT Time Calculation (min) 48 min    Activity Tolerance Patient tolerated treatment well    Behavior During Therapy WFL for tasks assessed/performed             Past Medical History:  Diagnosis Date   Anxiety    Asthma    Diabetes mellitus without complication (HCC)    Edema of lower extremity    Hyperlipidemia    Hypertension    Prediabetes    Restless legs    Past Surgical History:  Procedure Laterality Date   bone spur removal Right    BUNIONECTOMY Bilateral 2005   PARTIAL HYSTERECTOMY  2008   TUBAL LIGATION  1982   Patient Active Problem List   Diagnosis Date Noted   Cerebrovascular accident (CVA) due to occlusion of small artery (HCC) 10/30/2020   Numbness and tingling in right hand 10/30/2020   Acute stroke due to ischemia (HCC) 06/29/2020   Snoring 06/29/2020   Sleep apnea in adult 06/29/2020   Mild intermittent asthma 06/29/2020   Hypokalemia 04/24/2020   Lab test positive for detection of COVID-19 virus 04/24/2020   TIA (transient ischemic attack) 04/23/2020   Diverticulosis of colon 01/16/2018   Internal hemorrhoids 01/16/2018   Tubular adenoma 01/16/2018   Paresthesias 04/28/2017   Anxiety disorder 11/05/2016   Edema of lower extremity 11/05/2016   Essential hypertension 11/05/2016   Hyperlipidemia 11/05/2016   Prediabetes 11/05/2016   Restless legs 11/05/2016   Mild persistent asthma in adult without complication 12/12/2011    ONSET DATE: ~Feb 2022  REFERRING  DIAG:  G37.9 (ICD-10-CM) - Demyelinating disease of the spinal cord (HCC)  R29.898 (ICD-10-CM) - Right leg weakness  R26.9 (ICD-10-CM) - Gait disturbance    THERAPY DIAG:  Muscle weakness (generalized)  Difficulty in walking, not elsewhere classified  Unsteadiness on feet  Rationale for Evaluation and Treatment: Rehabilitation  SUBJECTIVE:  SUBJECTIVE STATEMENT: Patient reports she attended silver sneakers class earlier today, states she has been trying to stay mobile throughout the day while she is between personal trainers. Patient reports no pain in hips or anywhere else today. Patient states she had a busy weekend and was not able to review HEP.  EVAL: Pt reports she saw [name] (Neurologist) on 09/09/22. Pt had MRI and was diagnosed with MS. Pt is to have a spinal tap to decide further management. Pt reports increased R UE and LE weakness since February 2022 when she initially thought she had a TIA. As time went on, pt reports increased complications on her R side. Pt states she is working with a Systems analyst and silver sneakers (2x/wk). Has started water aerobics in early July but has not been consistent due to vacation and schedule. Pt does note some aching bilat hip pain that can radiate when she's in bed but otherwise no other abnormal sensations or pain. She has a cane but does not use all the time and tries not to use regularly. Pt accompanied by: self  PERTINENT HISTORY: From Dr. [name] referral notes: 77 yo woman with probable MS and gait disorder and minimal right weakness   PAIN:  Are you having pain? None  PRECAUTIONS: None  RED FLAGS: None   WEIGHT BEARING RESTRICTIONS: No  FALLS: Has patient fallen in last 6 months? No  LIVING ENVIRONMENT: Lives with: lives alone; has  daughters (live in [place]), 3 sisters in [place] Lives in: House/apartment Stairs: No Has upstairs but doesn't need to go up there Has following equipment at home: Single point cane, 4 prong cane, walker  PLOF: Independent  PATIENT GOALS: Decrease use of cane, strengthen bilat LEs  OBJECTIVE:   DIAGNOSTIC FINDINGS: Recent MRI 09/02/22: IMPRESSION: Findings consistent with the clinical diagnosis of multiple sclerosis. Compared to prior MRI, there is interval progression of the T2 hyperintense lesions in the bilateral corona radiata and genu of the corpus callosum. No focus of abnormal contrast enhancement to suggest active demyelination.  COGNITION: Overall cognitive status: Within functional limits for tasks assessed   SENSATION: R hand cold and numb  EDEMA:  none  MUSCLE TONE: None noted on eval   POSTURE: No Significant postural limitations  LOWER EXTREMITY ROM:     Active  Right Eval Left Eval  Hip flexion 85 >100  Hip extension    Hip abduction    Hip adduction    Hip internal rotation    Hip external rotation    Knee flexion    Knee extension    Ankle dorsiflexion    Ankle plantarflexion    Ankle inversion    Ankle eversion     (Blank rows = not tested)  LOWER EXTREMITY MMT:    MMT Right Eval Left Eval  Hip flexion 3- 4  Hip extension 3+ 3+  Hip abduction 2+ 3+  Hip adduction    Hip internal rotation    Hip external rotation    Knee flexion 3+ 5  Knee extension 3+ 5  Ankle dorsiflexion    Ankle plantarflexion    Ankle inversion    Ankle eversion    Grip strength 22# 40#  (Blank rows = not tested)  BED MOBILITY:  Independent  TRANSFERS: Assistive device utilized: None  Sit to stand: Complete Independence Stand to sit: Complete Independence  STAIRS: Level of Assistance: CGA Stair Negotiation Technique: Alternating Pattern  with Single Rail on Left Number of Stairs: 5 steps x 3  Height of Stairs: 4"-6"   Comments: Mild LOB  when attempted no rail with descent  GAIT: Gait pattern: step through pattern, decreased step length- Right, decreased stance time- Right, decreased ankle dorsiflexion- Right, Right foot flat, lateral lean- Right, and poor foot clearance- Right Increased R knee valgus during stance Distance walked: Multiple laps in clinic during DGI Assistive device utilized: None Level of assistance: CGA  FUNCTIONAL TESTS:  5 times sit to stand: 19.5 sec (needed use of UEs) Dynamic Gait Index: 16/24    TODAY'S TREATMENT:   [facility] Adult PT Treatment:                                                DATE: 10/02/2022 Therapeutic Exercise: Recumbent bike L2 x 5 min Mat Table: Supine quad set (towel roll) 5x5" R SLR (therapist stabilizing L hip) x10 S/L (R) clamshells RTB 2x10 S/L (R) straight leg hip abd raises in IR x10 S/L (R) hip extension on diagonal (foot on block) x10 Hooklying R hip add iso ball squeeze 5x5" Supine bridges + ball squeeze 2x10 Prone (R) knee flexion (stabilizing pelvis) x10 Standing heel raises --> added small ball b/w ankles x10 STS (seated on airex) --> added RTB around knees                                                                                                                            DATE: 09/26/22  See HEP below   PATIENT EDUCATION: Education details: Exam findings, POC, initial HEP Person educated: Patient Education method: Explanation, Demonstration, and Handouts Education comprehension: verbalized understanding, returned demonstration, and needs further education  HOME EXERCISE PROGRAM: Access Code: [email or web address] URL: [email or web address] Date: 09/26/2022 Prepared by: Vernon Prey April Kirstie Peri  Exercises - Supine Bridge  - 1 x daily - 7 x weekly - 1 sets - 10 reps - Small Range Straight Leg Raise  - 1 x daily - 7 x weekly - 1 sets - 10 reps - Clamshell  - 1 x daily - 7 x weekly - 1 sets - 10 reps - Prone Knee Flexion  - 1 x daily - 7 x  weekly - 1 sets - 10 reps  GOALS: Goals reviewed with patient? Yes  SHORT TERM GOALS: Target date: 10/24/2022  Pt will be ind with initial strength and balance HEP Baseline: Goal status: INITIAL  2.  Pt will be able to perform 5x STS without UE assist Baseline:  Goal status: INITIAL  3.  Pt will have improved DGI to >/=18/24 Baseline:  Goal status: INITIAL   LONG TERM GOALS: Target date: 11/21/2022  Pt will be ind with management and progression of HEP and continue community wellness Baseline:  Goal status: INITIAL  2.  Pt will be able to perform 5x STS in </=13 sec to demo decreased fall risk Baseline:  Goal status: INITIAL  3.  Pt will be able to demo improved DGI to >/=22/24 for decreased fall risk Baseline:  Goal status: INITIAL  4.  Pt will be able to amb grocery store (>1000') independently Baseline:  Goal status: INITIAL  5.  Pt will be able to demo at least 4/5 bilat LE strength per pt's personal goals Baseline:  Goal status: INITIAL   ASSESSMENT:  CLINICAL IMPRESSION:  Strengthening exercises continued, focusing on hip and glute activation. Tactile cues provided to improve pelvic stability; increased difficulty due to LE weakness with therapist stabilizing pelvis during prone knee flexion. Min A provided for anterior weight shifting during STS exercise with added resistance for hip abd isometric stabilization.    EVAL: Patient is a 77 y.o. F who was seen today for physical therapy evaluation and treatment for new diagnosis of MS with gait instability. Pt notes history of TIA in 2022 which may have been her initial MS exacerbation. Assessment significant for R LE weakness, decreased ROM, decreased balance with high fall risk, and decreased endurance affecting home and community mobility. Pt will benefit from PT to address these deficits for continued safe participation in recreational activities.   OBJECTIVE IMPAIRMENTS: Abnormal gait, decreased activity  tolerance, decreased balance, decreased endurance, decreased mobility, difficulty walking, decreased ROM, decreased strength, impaired UE functional use, and pain.   ACTIVITY LIMITATIONS: carrying, lifting, standing, squatting, sleeping, stairs, and locomotion level  PARTICIPATION LIMITATIONS: meal prep, cleaning, shopping, community activity, and yard work  PERSONAL FACTORS: Fitness, Past/current experiences, and Time since onset of injury/illness/exacerbation are also affecting patient's functional outcome.   REHAB POTENTIAL: Good  CLINICAL DECISION MAKING: Evolving/moderate complexity  EVALUATION COMPLEXITY: Moderate  PLAN:  PT FREQUENCY: 1-2x/week (initially 2x/wk for 4 weeks with plans to wean to 1x/wk)  PT DURATION: 8 weeks  PLANNED INTERVENTIONS: Therapeutic exercises, Therapeutic activity, Neuromuscular re-education, Balance training, Gait training, Patient/Family education, Self Care, Joint mobilization, Stair training, Dry Needling, Electrical stimulation, Cryotherapy, Moist heat, Taping, Ionotophoresis 4mg /ml Dexamethasone, Manual therapy, and Re-evaluation  PLAN FOR NEXT SESSION: Add on to HEP. Work on general LE strengthening and balance    Sanjuana Mae, PTA 10/02/2022, 3:43 PM

## 2022-10-03 ENCOUNTER — Ambulatory Visit: Payer: Medicare HMO

## 2022-10-03 DIAGNOSIS — R262 Difficulty in walking, not elsewhere classified: Secondary | ICD-10-CM

## 2022-10-03 DIAGNOSIS — M6281 Muscle weakness (generalized): Secondary | ICD-10-CM | POA: Diagnosis not present

## 2022-10-03 DIAGNOSIS — R2681 Unsteadiness on feet: Secondary | ICD-10-CM

## 2022-10-03 NOTE — Therapy (Signed)
OUTPATIENT PHYSICAL THERAPY NEURO TREATMENT   Patient Name: Melinda Mckay MRN: [id number] DOB:[birth year], 77 y.o., female Today's Date: 10/03/2022   PCP: Harvest Forest, MD REFERRING PROVIDER:   Asa Lente, MD    END OF SESSION:  PT End of Session - 10/03/22 1402     Visit Number 3    Number of Visits 16    Date for PT Re-Evaluation 11/21/22    Authorization Type [employer]    Progress Note Due on Visit 10    PT Start Time 1402    PT Stop Time 1445    PT Time Calculation (min) 43 min    Activity Tolerance Patient tolerated treatment well    Behavior During Therapy WFL for tasks assessed/performed             Past Medical History:  Diagnosis Date   Anxiety    Asthma    Diabetes mellitus without complication (HCC)    Edema of lower extremity    Hyperlipidemia    Hypertension    Prediabetes    Restless legs    Past Surgical History:  Procedure Laterality Date   bone spur removal Right    BUNIONECTOMY Bilateral 2005   PARTIAL HYSTERECTOMY  2008   TUBAL LIGATION  1982   Patient Active Problem List   Diagnosis Date Noted   Cerebrovascular accident (CVA) due to occlusion of small artery (HCC) 10/30/2020   Numbness and tingling in right hand 10/30/2020   Acute stroke due to ischemia (HCC) 06/29/2020   Snoring 06/29/2020   Sleep apnea in adult 06/29/2020   Mild intermittent asthma 06/29/2020   Hypokalemia 04/24/2020   Lab test positive for detection of COVID-19 virus 04/24/2020   TIA (transient ischemic attack) 04/23/2020   Diverticulosis of colon 01/16/2018   Internal hemorrhoids 01/16/2018   Tubular adenoma 01/16/2018   Paresthesias 04/28/2017   Anxiety disorder 11/05/2016   Edema of lower extremity 11/05/2016   Essential hypertension 11/05/2016   Hyperlipidemia 11/05/2016   Prediabetes 11/05/2016   Restless legs 11/05/2016   Mild persistent asthma in adult without complication 12/12/2011    ONSET DATE: ~Feb 2022  REFERRING  DIAG:  G37.9 (ICD-10-CM) - Demyelinating disease of the spinal cord (HCC)  R29.898 (ICD-10-CM) - Right leg weakness  R26.9 (ICD-10-CM) - Gait disturbance    THERAPY DIAG:  Muscle weakness (generalized)  Difficulty in walking, not elsewhere classified  Unsteadiness on feet  Rationale for Evaluation and Treatment: Rehabilitation  SUBJECTIVE:  SUBJECTIVE STATEMENT: Patient reports she is feeling tight and tired due to the rainy weather today. Patient states she felt fine after last PT visit, no pain or soreness.  EVAL: Pt reports she saw [name] (Neurologist) on 09/09/22. Pt had MRI and was diagnosed with MS. Pt is to have a spinal tap to decide further management. Pt reports increased R UE and LE weakness since February 2022 when she initially thought she had a TIA. As time went on, pt reports increased complications on her R side. Pt states she is working with a Systems analyst and silver sneakers (2x/wk). Has started water aerobics in early July but has not been consistent due to vacation and schedule. Pt does note some aching bilat hip pain that can radiate when she's in bed but otherwise no other abnormal sensations or pain. She has a cane but does not use all the time and tries not to use regularly. Pt accompanied by: self  PERTINENT HISTORY: From Dr. [name] referral notes: 77 yo woman with probable MS and gait disorder and minimal right weakness   PAIN:  Are you having pain? None  PRECAUTIONS: None  RED FLAGS: None   WEIGHT BEARING RESTRICTIONS: No  FALLS: Has patient fallen in last 6 months? No  LIVING ENVIRONMENT: Lives with: lives alone; has daughters (live in [place]), 3 sisters in [place] Lives in: House/apartment Stairs: No Has upstairs but doesn't need to go up there Has  following equipment at home: Single point cane, 4 prong cane, walker  PLOF: Independent  PATIENT GOALS: Decrease use of cane, strengthen bilat LEs  OBJECTIVE:   DIAGNOSTIC FINDINGS: Recent MRI 09/02/22: IMPRESSION: Findings consistent with the clinical diagnosis of multiple sclerosis. Compared to prior MRI, there is interval progression of the T2 hyperintense lesions in the bilateral corona radiata and genu of the corpus callosum. No focus of abnormal contrast enhancement to suggest active demyelination.  COGNITION: Overall cognitive status: Within functional limits for tasks assessed   SENSATION: R hand cold and numb  EDEMA:  none  MUSCLE TONE: None noted on eval   POSTURE: No Significant postural limitations  LOWER EXTREMITY ROM:     Active  Right Eval Left Eval  Hip flexion 85 >100  Hip extension    Hip abduction    Hip adduction    Hip internal rotation    Hip external rotation    Knee flexion    Knee extension    Ankle dorsiflexion    Ankle plantarflexion    Ankle inversion    Ankle eversion     (Blank rows = not tested)  LOWER EXTREMITY MMT:    MMT Right Eval Left Eval  Hip flexion 3- 4  Hip extension 3+ 3+  Hip abduction 2+ 3+  Hip adduction    Hip internal rotation    Hip external rotation    Knee flexion 3+ 5  Knee extension 3+ 5  Ankle dorsiflexion    Ankle plantarflexion    Ankle inversion    Ankle eversion    Grip strength 22# 40#  (Blank rows = not tested)  BED MOBILITY:  Independent  TRANSFERS: Assistive device utilized: None  Sit to stand: Complete Independence Stand to sit: Complete Independence  STAIRS: Level of Assistance: CGA Stair Negotiation Technique: Alternating Pattern  with Single Rail on Left Number of Stairs: 5 steps x 3  Height of Stairs: 4"-6"   Comments: Mild LOB when attempted no rail with descent  GAIT: Gait pattern: step through pattern,  decreased step length- Right, decreased stance time- Right,  decreased ankle dorsiflexion- Right, Right foot flat, lateral lean- Right, and poor foot clearance- Right Increased R knee valgus during stance Distance walked: Multiple laps in clinic during DGI Assistive device utilized: None Level of assistance: CGA  FUNCTIONAL TESTS:  5 times sit to stand: 19.5 sec (needed use of UEs) Dynamic Gait Index: 16/24    TODAY'S TREATMENT:   [facility] Adult PT Treatment:                                                DATE: 10/03/2022 Therapeutic Exercise: Hooklying hip add ball squeeze (R) 10x5" Supine bridges + ball squeeze x10 --> staggered stance (R back) + R ball squeeze x10 S/L clamshells RTB (R) x12 --> bent knee hip abd RTB x12 Prone frog leg heel squeeze 10x3" Prone R knee flexion AROM Seated unilateral clamshell RTB x10 each leg Resisted side stepping RTB crossed at ankles (counter for support) Long Sitting Isometric Ankle Eversion in Dorsiflexion with Delfino Lovett Adult PT Treatment:                                                DATE: 10/02/2022 Therapeutic Exercise: Recumbent bike L2 x 5 min Mat Table: Supine quad set (towel roll) 5x5" R SLR (therapist stabilizing L hip) x10 S/L (R) clamshells RTB 2x10 S/L (R) straight leg hip abd raises in IR x10 S/L (R) hip extension on diagonal (foot on block) x10 Hooklying R hip add iso ball squeeze 5x5" Supine bridges + ball squeeze 2x10 Prone (R) knee flexion (stabilizing pelvis) x10 Standing heel raises --> added small ball b/w ankles x10 STS (seated on airex) --> added RTB around knees                                                                                                                            PATIENT EDUCATION: Education details: Exam findings, POC, initial HEP Person educated: Patient Education method: Explanation, Demonstration, and Handouts Education comprehension: verbalized understanding, returned demonstration, and needs further education  HOME EXERCISE PROGRAM: Access  Code: [email or web address] URL: [email or web address] Date: 10/03/2022 Prepared by: Carlynn Herald  Exercises - Small Range Straight Leg Raise  - 1 x daily - 7 x weekly - 1 sets - 10 reps - Prone Knee Flexion  - 1 x daily - 7 x weekly - 1 sets - 10 reps - Supine Bridge with Mini Swiss Ball Between Knees  - 1 x daily - 7 x weekly - 3 sets - 10 reps - Clam with Resistance  - 1 x daily - 7 x weekly - 3 sets - 10 reps - Prone Heel Squeeze  -  1 x daily - 7 x weekly - 3 sets - 10 reps - Side Stepping with Resistance at Ankles and Counter Support  - 1 x daily - 7 x weekly - 3 sets - 10 reps - Long Sitting Isometric Ankle Eversion in Dorsiflexion with Ball at Wall  - 1 x daily - 7 x weekly - 2 sets - 10 reps - 5 sec hold   GOALS: Goals reviewed with patient? Yes  SHORT TERM GOALS: Target date: 10/24/2022  Pt will be ind with initial strength and balance HEP Baseline: Goal status: INITIAL  2.  Pt will be able to perform 5x STS without UE assist Baseline:  Goal status: INITIAL  3.  Pt will have improved DGI to >/=18/24 Baseline:  Goal status: INITIAL   LONG TERM GOALS: Target date: 11/21/2022  Pt will be ind with management and progression of HEP and continue community wellness Baseline:  Goal status: INITIAL  2.  Pt will be able to perform 5x STS in </=13 sec to demo decreased fall risk Baseline:  Goal status: INITIAL  3.  Pt will be able to demo improved DGI to >/=22/24 for decreased fall risk Baseline:  Goal status: INITIAL  4.  Pt will be able to amb grocery store (>1000') independently Baseline:  Goal status: INITIAL  5.  Pt will be able to demo at least 4/5 bilat LE strength per pt's personal goals Baseline:  Goal status: INITIAL   ASSESSMENT:  CLINICAL IMPRESSION:  Patient presented with low energy due to physical reaction to weather and barometric pressure. Hip and glute strengthening continued, progressing hip abd with resistance in side lying and standing.     EVAL: Patient is a 77 y.o. F who was seen today for physical therapy evaluation and treatment for new diagnosis of MS with gait instability. Pt notes history of TIA in 2022 which may have been her initial MS exacerbation. Assessment significant for R LE weakness, decreased ROM, decreased balance with high fall risk, and decreased endurance affecting home and community mobility. Pt will benefit from PT to address these deficits for continued safe participation in recreational activities.   OBJECTIVE IMPAIRMENTS: Abnormal gait, decreased activity tolerance, decreased balance, decreased endurance, decreased mobility, difficulty walking, decreased ROM, decreased strength, impaired UE functional use, and pain.   ACTIVITY LIMITATIONS: carrying, lifting, standing, squatting, sleeping, stairs, and locomotion level  PARTICIPATION LIMITATIONS: meal prep, cleaning, shopping, community activity, and yard work  PERSONAL FACTORS: Fitness, Past/current experiences, and Time since onset of injury/illness/exacerbation are also affecting patient's functional outcome.   REHAB POTENTIAL: Good  CLINICAL DECISION MAKING: Evolving/moderate complexity  EVALUATION COMPLEXITY: Moderate  PLAN:  PT FREQUENCY: 1-2x/week (initially 2x/wk for 4 weeks with plans to wean to 1x/wk)  PT DURATION: 8 weeks  PLANNED INTERVENTIONS: Therapeutic exercises, Therapeutic activity, Neuromuscular re-education, Balance training, Gait training, Patient/Family education, Self Care, Joint mobilization, Stair training, Dry Needling, Electrical stimulation, Cryotherapy, Moist heat, Taping, Ionotophoresis 4mg /ml Dexamethasone, Manual therapy, and Re-evaluation  PLAN FOR NEXT SESSION: Work on general LE strengthening and balance    Sanjuana Mae, PTA 10/03/2022, 2:45 PM

## 2022-10-07 ENCOUNTER — Ambulatory Visit: Payer: Medicare HMO

## 2022-10-07 DIAGNOSIS — M6281 Muscle weakness (generalized): Secondary | ICD-10-CM | POA: Diagnosis not present

## 2022-10-07 DIAGNOSIS — R2681 Unsteadiness on feet: Secondary | ICD-10-CM

## 2022-10-07 DIAGNOSIS — R262 Difficulty in walking, not elsewhere classified: Secondary | ICD-10-CM

## 2022-10-07 NOTE — Therapy (Signed)
OUTPATIENT PHYSICAL THERAPY NEURO TREATMENT   Patient Name: Melinda Mckay MRN: [id number] DOB:March 31, 1945, 77 y.o., female Today's Date: 10/07/2022   PCP: Harvest Forest, MD REFERRING PROVIDER:   Asa Lente, MD    END OF SESSION:  PT End of Session - 10/07/22 1448     Visit Number 4    Number of Visits 16    Date for PT Re-Evaluation 11/21/22    Authorization Type [employer]    Progress Note Due on Visit 10    PT Start Time 1448    PT Stop Time 1530    PT Time Calculation (min) 42 min    Activity Tolerance Patient tolerated treatment well    Behavior During Therapy WFL for tasks assessed/performed             Past Medical History:  Diagnosis Date   Anxiety    Asthma    Diabetes mellitus without complication (HCC)    Edema of lower extremity    Hyperlipidemia    Hypertension    Prediabetes    Restless legs    Past Surgical History:  Procedure Laterality Date   bone spur removal Right    BUNIONECTOMY Bilateral 2005   PARTIAL HYSTERECTOMY  2008   TUBAL LIGATION  1982   Patient Active Problem List   Diagnosis Date Noted   Cerebrovascular accident (CVA) due to occlusion of small artery (HCC) 10/30/2020   Numbness and tingling in right hand 10/30/2020   Acute stroke due to ischemia (HCC) 06/29/2020   Snoring 06/29/2020   Sleep apnea in adult 06/29/2020   Mild intermittent asthma 06/29/2020   Hypokalemia 04/24/2020   Lab test positive for detection of COVID-19 virus 04/24/2020   TIA (transient ischemic attack) 04/23/2020   Diverticulosis of colon 01/16/2018   Internal hemorrhoids 01/16/2018   Tubular adenoma 01/16/2018   Paresthesias 04/28/2017   Anxiety disorder 11/05/2016   Edema of lower extremity 11/05/2016   Essential hypertension 11/05/2016   Hyperlipidemia 11/05/2016   Prediabetes 11/05/2016   Restless legs 11/05/2016   Mild persistent asthma in adult without complication 12/12/2011    ONSET DATE: ~Feb 2022  REFERRING  DIAG:  G37.9 (ICD-10-CM) - Demyelinating disease of the spinal cord (HCC)  R29.898 (ICD-10-CM) - Right leg weakness  R26.9 (ICD-10-CM) - Gait disturbance    THERAPY DIAG:  Muscle weakness (generalized)  Difficulty in walking, not elsewhere classified  Unsteadiness on feet  Rationale for Evaluation and Treatment: Rehabilitation  SUBJECTIVE:  SUBJECTIVE STATEMENT: Patient reports she is feeling tight and sore today due to the weather. Patient states she had a session with personal trainer this morning and went to a silver sneakers class.   EVAL: Pt reports she saw [name] (Neurologist) on 09/09/22. Pt had MRI and was diagnosed with MS. Pt is to have a spinal tap to decide further management. Pt reports increased R UE and LE weakness since February 2022 when she initially thought she had a TIA. As time went on, pt reports increased complications on her R side. Pt states she is working with a Systems analyst and silver sneakers (2x/wk). Has started water aerobics in early July but has not been consistent due to vacation and schedule. Pt does note some aching bilat hip pain that can radiate when she's in bed but otherwise no other abnormal sensations or pain. She has a cane but does not use all the time and tries not to use regularly. Pt accompanied by: self  PERTINENT HISTORY: From Dr. [name] referral notes: 77 yo woman with probable MS and gait disorder and minimal right weakness   PAIN:  Are you having pain? None  PRECAUTIONS: None  RED FLAGS: None   WEIGHT BEARING RESTRICTIONS: No  FALLS: Has patient fallen in last 6 months? No  LIVING ENVIRONMENT: Lives with: lives alone; has daughters (live in [place]), 3 sisters in [place] Lives in: House/apartment Stairs: No Has upstairs but doesn't  need to go up there Has following equipment at home: Single point cane, 4 prong cane, walker  PLOF: Independent  PATIENT GOALS: Decrease use of cane, strengthen bilat LEs  OBJECTIVE:   DIAGNOSTIC FINDINGS: Recent MRI 09/02/22: IMPRESSION: Findings consistent with the clinical diagnosis of multiple sclerosis. Compared to prior MRI, there is interval progression of the T2 hyperintense lesions in the bilateral corona radiata and genu of the corpus callosum. No focus of abnormal contrast enhancement to suggest active demyelination.  COGNITION: Overall cognitive status: Within functional limits for tasks assessed   SENSATION: R hand cold and numb  EDEMA:  none  MUSCLE TONE: None noted on eval   POSTURE: No Significant postural limitations  LOWER EXTREMITY ROM:     Active  Right Eval Left Eval  Hip flexion 85 >100  Hip extension    Hip abduction    Hip adduction    Hip internal rotation    Hip external rotation    Knee flexion    Knee extension    Ankle dorsiflexion    Ankle plantarflexion    Ankle inversion    Ankle eversion     (Blank rows = not tested)  LOWER EXTREMITY MMT:    MMT Right Eval Left Eval  Hip flexion 3- 4  Hip extension 3+ 3+  Hip abduction 2+ 3+  Hip adduction    Hip internal rotation    Hip external rotation    Knee flexion 3+ 5  Knee extension 3+ 5  Ankle dorsiflexion    Ankle plantarflexion    Ankle inversion    Ankle eversion    Grip strength 22# 40#  (Blank rows = not tested)  BED MOBILITY:  Independent  TRANSFERS: Assistive device utilized: None  Sit to stand: Complete Independence Stand to sit: Complete Independence  STAIRS: Level of Assistance: CGA Stair Negotiation Technique: Alternating Pattern  with Single Rail on Left Number of Stairs: 5 steps x 3  Height of Stairs: 4"-6"   Comments: Mild LOB when attempted no rail with descent  GAIT:  Gait pattern: step through pattern, decreased step length- Right, decreased  stance time- Right, decreased ankle dorsiflexion- Right, Right foot flat, lateral lean- Right, and poor foot clearance- Right Increased R knee valgus during stance Distance walked: Multiple laps in clinic during DGI Assistive device utilized: None Level of assistance: CGA  FUNCTIONAL TESTS:  5 times sit to stand: 19.5 sec (needed use of UEs) Dynamic Gait Index: 16/24    TODAY'S TREATMENT:   [facility] Adult PT Treatment:                                                DATE: 10/07/2022 Therapeutic Exercise: Recumbent bike L2 x 3 min Seated edge of table: hip flexor stretch Quadruped: Fwd/bkwd rocking x10 Leg extension (toe on ground) x5 each leg Hydrants x10 (B) Leg extension kick out x10 (B) Seated LAQ + ball squeeze 10x3" (B) Long sitting isometric ankle DF/ever with ball against wall 5x5" Prone knee flexion (R) --> min A --> active assist with strap   [facility] Adult PT Treatment:                                                DATE: 10/03/2022 Therapeutic Exercise: Hooklying hip add ball squeeze (R) 10x5" Supine bridges + ball squeeze x10 --> staggered stance (R back) + R ball squeeze x10 S/L clamshells RTB (R) x12 --> bent knee hip abd RTB x12 Prone frog leg heel squeeze 10x3" Prone R knee flexion AROM Seated unilateral clamshell RTB x10 each leg Resisted side stepping RTB crossed at ankles (counter for support) Long Sitting Isometric Ankle Eversion in Dorsiflexion with Delfino Lovett Adult PT Treatment:                                                DATE: 10/02/2022 Therapeutic Exercise: Recumbent bike L2 x 5 min Mat Table: Supine quad set (towel roll) 5x5" R SLR (therapist stabilizing L hip) x10 S/L (R) clamshells RTB 2x10 S/L (R) straight leg hip abd raises in IR x10 S/L (R) hip extension on diagonal (foot on block) x10 Hooklying R hip add iso ball squeeze 5x5" Supine bridges + ball squeeze 2x10 Prone (R) knee flexion (stabilizing pelvis) x10 Standing heel raises --> added  small ball b/w ankles x10 STS (seated on airex) --> added RTB around knees                                                                                                                            PATIENT EDUCATION: Education details: Updated HEP Person  educated: Patient Education method: Explanation, Demonstration, and Handouts Education comprehension: verbalized understanding, returned demonstration, and needs further education  HOME EXERCISE PROGRAM: Access Code: [email or web address] URL: [email or web address] Date: 10/03/2022 Prepared by: Carlynn Herald  Exercises - Small Range Straight Leg Raise  - 1 x daily - 7 x weekly - 1 sets - 10 reps - Prone Knee Flexion  - 1 x daily - 7 x weekly - 1 sets - 10 reps - Supine Bridge with Mini Swiss Ball Between Knees  - 1 x daily - 7 x weekly - 3 sets - 10 reps - Clam with Resistance  - 1 x daily - 7 x weekly - 3 sets - 10 reps - Prone Heel Squeeze  - 1 x daily - 7 x weekly - 3 sets - 10 reps - Side Stepping with Resistance at Ankles and Counter Support  - 1 x daily - 7 x weekly - 3 sets - 10 reps - Long Sitting Isometric Ankle Eversion in Dorsiflexion with Ball at Wall  - 1 x daily - 7 x weekly - 2 sets - 10 reps - 5 sec hold   GOALS: Goals reviewed with patient? Yes  SHORT TERM GOALS: Target date: 10/24/2022  Pt will be ind with initial strength and balance HEP Baseline: Goal status: INITIAL  2.  Pt will be able to perform 5x STS without UE assist Baseline:  Goal status: INITIAL  3.  Pt will have improved DGI to >/=18/24 Baseline:  Goal status: INITIAL   LONG TERM GOALS: Target date: 11/21/2022  Pt will be ind with management and progression of HEP and continue community wellness Baseline:  Goal status: INITIAL  2.  Pt will be able to perform 5x STS in </=13 sec to demo decreased fall risk Baseline:  Goal status: INITIAL  3.  Pt will be able to demo improved DGI to >/=22/24 for decreased fall risk Baseline:  Goal  status: INITIAL  4.  Pt will be able to amb grocery store (>1000') independently Baseline:  Goal status: INITIAL  5.  Pt will be able to demo at least 4/5 bilat LE strength per pt's personal goals Baseline:  Goal status: INITIAL   ASSESSMENT:  CLINICAL IMPRESSION:  Exercises in quadruped introduced to challenge core strength and hip stability; patient challenged with weight bearing on R LE during quadruped kick outs. Min assist provided during prone knee flexion due to fatigue and weakness; stretch provided active assist.   EVAL: Patient is a 77 y.o. F who was seen today for physical therapy evaluation and treatment for new diagnosis of MS with gait instability. Pt notes history of TIA in 2022 which may have been her initial MS exacerbation. Assessment significant for R LE weakness, decreased ROM, decreased balance with high fall risk, and decreased endurance affecting home and community mobility. Pt will benefit from PT to address these deficits for continued safe participation in recreational activities.   OBJECTIVE IMPAIRMENTS: Abnormal gait, decreased activity tolerance, decreased balance, decreased endurance, decreased mobility, difficulty walking, decreased ROM, decreased strength, impaired UE functional use, and pain.   ACTIVITY LIMITATIONS: carrying, lifting, standing, squatting, sleeping, stairs, and locomotion level  PARTICIPATION LIMITATIONS: meal prep, cleaning, shopping, community activity, and yard work  PERSONAL FACTORS: Fitness, Past/current experiences, and Time since onset of injury/illness/exacerbation are also affecting patient's functional outcome.   REHAB POTENTIAL: Good  CLINICAL DECISION MAKING: Evolving/moderate complexity  EVALUATION COMPLEXITY: Moderate  PLAN:  PT FREQUENCY: 1-2x/week (initially 2x/wk for 4 weeks with plans to wean  to 1x/wk)  PT DURATION: 8 weeks  PLANNED INTERVENTIONS: Therapeutic exercises, Therapeutic activity, Neuromuscular  re-education, Balance training, Gait training, Patient/Family education, Self Care, Joint mobilization, Stair training, Dry Needling, Electrical stimulation, Cryotherapy, Moist heat, Taping, Ionotophoresis 4mg /ml Dexamethasone, Manual therapy, and Re-evaluation  PLAN FOR NEXT SESSION: Work on general LE strengthening and balance    Sanjuana Mae, PTA 10/07/2022, 3:33 PM

## 2022-10-10 ENCOUNTER — Ambulatory Visit: Payer: Medicare HMO | Attending: Neurology

## 2022-10-10 DIAGNOSIS — R2681 Unsteadiness on feet: Secondary | ICD-10-CM | POA: Diagnosis present

## 2022-10-10 DIAGNOSIS — M6281 Muscle weakness (generalized): Secondary | ICD-10-CM | POA: Insufficient documentation

## 2022-10-10 DIAGNOSIS — R262 Difficulty in walking, not elsewhere classified: Secondary | ICD-10-CM | POA: Insufficient documentation

## 2022-10-10 NOTE — Therapy (Signed)
OUTPATIENT PHYSICAL THERAPY NEURO TREATMENT   Patient Name: Melinda Mckay MRN: [id number] DOB:02/28/1946, 77 y.o., female Today's Date: 10/10/2022   PCP: Harvest Forest, MD REFERRING PROVIDER:   Asa Lente, MD    END OF SESSION:  PT End of Session - 10/10/22 1147     Visit Number 5    Number of Visits 16    Date for PT Re-Evaluation 11/21/22    Authorization Type [employer]    Progress Note Due on Visit 10    PT Start Time 1148    PT Stop Time 1233    PT Time Calculation (min) 45 min    Activity Tolerance Patient tolerated treatment well    Behavior During Therapy WFL for tasks assessed/performed             Past Medical History:  Diagnosis Date   Anxiety    Asthma    Diabetes mellitus without complication (HCC)    Edema of lower extremity    Hyperlipidemia    Hypertension    Prediabetes    Restless legs    Past Surgical History:  Procedure Laterality Date   bone spur removal Right    BUNIONECTOMY Bilateral 2005   PARTIAL HYSTERECTOMY  2008   TUBAL LIGATION  1982   Patient Active Problem List   Diagnosis Date Noted   Cerebrovascular accident (CVA) due to occlusion of small artery (HCC) 10/30/2020   Numbness and tingling in right hand 10/30/2020   Acute stroke due to ischemia (HCC) 06/29/2020   Snoring 06/29/2020   Sleep apnea in adult 06/29/2020   Mild intermittent asthma 06/29/2020   Hypokalemia 04/24/2020   Lab test positive for detection of COVID-19 virus 04/24/2020   TIA (transient ischemic attack) 04/23/2020   Diverticulosis of colon 01/16/2018   Internal hemorrhoids 01/16/2018   Tubular adenoma 01/16/2018   Paresthesias 04/28/2017   Anxiety disorder 11/05/2016   Edema of lower extremity 11/05/2016   Essential hypertension 11/05/2016   Hyperlipidemia 11/05/2016   Prediabetes 11/05/2016   Restless legs 11/05/2016   Mild persistent asthma in adult without complication 12/12/2011    ONSET DATE: ~Feb 2022  REFERRING DIAG:   G37.9 (ICD-10-CM) - Demyelinating disease of the spinal cord (HCC)  R29.898 (ICD-10-CM) - Right leg weakness  R26.9 (ICD-10-CM) - Gait disturbance    THERAPY DIAG:  Muscle weakness (generalized)  Difficulty in walking, not elsewhere classified  Unsteadiness on feet  Rationale for Evaluation and Treatment: Rehabilitation  SUBJECTIVE:  SUBJECTIVE STATEMENT: Patient reports she had a session with personal trainer earlier today; states she is feeling better today.  EVAL: Pt reports she saw [name] (Neurologist) on 09/09/22. Pt had MRI and was diagnosed with MS. Pt is to have a spinal tap to decide further management. Pt reports increased R UE and LE weakness since February 2022 when she initially thought she had a TIA. As time went on, pt reports increased complications on her R side. Pt states she is working with a Systems analyst and silver sneakers (2x/wk). Has started water aerobics in early July but has not been consistent due to vacation and schedule. Pt does note some aching bilat hip pain that can radiate when she's in bed but otherwise no other abnormal sensations or pain. She has a cane but does not use all the time and tries not to use regularly. Pt accompanied by: self  PERTINENT HISTORY: From Dr. [name] referral notes: 77 yo woman with probable MS and gait disorder and minimal right weakness   PAIN:  Are you having pain? None  PRECAUTIONS: None  RED FLAGS: None   WEIGHT BEARING RESTRICTIONS: No  FALLS: Has patient fallen in last 6 months? No  LIVING ENVIRONMENT: Lives with: lives alone; has daughters (live in [place]), 3 sisters in [place] Lives in: House/apartment Stairs: No Has upstairs but doesn't need to go up there Has following equipment at home: Single point cane, 4  prong cane, walker  PLOF: Independent  PATIENT GOALS: Decrease use of cane, strengthen bilat LEs  OBJECTIVE:   DIAGNOSTIC FINDINGS: Recent MRI 09/02/22: IMPRESSION: Findings consistent with the clinical diagnosis of multiple sclerosis. Compared to prior MRI, there is interval progression of the T2 hyperintense lesions in the bilateral corona radiata and genu of the corpus callosum. No focus of abnormal contrast enhancement to suggest active demyelination.  COGNITION: Overall cognitive status: Within functional limits for tasks assessed   SENSATION: R hand cold and numb  EDEMA:  none  MUSCLE TONE: None noted on eval   POSTURE: No Significant postural limitations  LOWER EXTREMITY ROM:     Active  Right Eval Left Eval  Hip flexion 85 >100  Hip extension    Hip abduction    Hip adduction    Hip internal rotation    Hip external rotation    Knee flexion    Knee extension    Ankle dorsiflexion    Ankle plantarflexion    Ankle inversion    Ankle eversion     (Blank rows = not tested)  LOWER EXTREMITY MMT:    MMT Right Eval Left Eval  Hip flexion 3- 4  Hip extension 3+ 3+  Hip abduction 2+ 3+  Hip adduction    Hip internal rotation    Hip external rotation    Knee flexion 3+ 5  Knee extension 3+ 5  Ankle dorsiflexion    Ankle plantarflexion    Ankle inversion    Ankle eversion    Grip strength 22# 40#  (Blank rows = not tested)  BED MOBILITY:  Independent  TRANSFERS: Assistive device utilized: None  Sit to stand: Complete Independence Stand to sit: Complete Independence  STAIRS: Level of Assistance: CGA Stair Negotiation Technique: Alternating Pattern  with Single Rail on Left Number of Stairs: 5 steps x 3  Height of Stairs: 4"-6"   Comments: Mild LOB when attempted no rail with descent  GAIT: Gait pattern: step through pattern, decreased step length- Right, decreased stance time- Right, decreased ankle  dorsiflexion- Right, Right foot flat,  lateral lean- Right, and poor foot clearance- Right Increased R knee valgus during stance Distance walked: Multiple laps in clinic during DGI Assistive device utilized: None Level of assistance: CGA  FUNCTIONAL TESTS:  5 times sit to stand: 19.5 sec (needed use of UEs) Dynamic Gait Index: 16/24    TODAY'S TREATMENT:   [facility] Adult PT Treatment:                                                DATE: 10/10/2022 Therapeutic Exercise: Recumbent bike L3 x STS no HHA, RTB above knees (R hip abd iso) 3x10 Prone knee flexion (R) 2x8 Leading R leg:  Front step up/down 8" stepper 2x10 Lateral step up/down (straddling step) 8" stepper 2x10 Walking stepping over high blocks R leg step over high blocks fwd/bkwd  Self Care: MS online resources Slow marching next to wall for HEP to challenge balance and strength   [facility] Adult PT Treatment:                                                DATE: 10/07/2022 Therapeutic Exercise: Recumbent bike L2 x 3 min Seated edge of table: hip flexor stretch Quadruped: Fwd/bkwd rocking x10 Leg extension (toe on ground) x5 each leg Hydrants x10 (B) Leg extension kick out x10 (B) Seated LAQ + ball squeeze 10x3" (B) Long sitting isometric ankle DF/ever with ball against wall 5x5" Prone knee flexion (R) --> min A --> active assist with strap   [facility] Adult PT Treatment:                                                DATE: 10/03/2022 Therapeutic Exercise: Hooklying hip add ball squeeze (R) 10x5" Supine bridges + ball squeeze x10 --> staggered stance (R back) + R ball squeeze x10 S/L clamshells RTB (R) x12 --> bent knee hip abd RTB x12 Prone frog leg heel squeeze 10x3" Prone R knee flexion AROM Seated unilateral clamshell RTB x10 each leg Resisted side stepping RTB crossed at ankles (counter for support) Long Sitting Isometric Ankle Eversion in Dorsiflexion with [employer]                                                                                                                             PATIENT EDUCATION: Education details: Updated HEP Person educated: Patient Education method: Programmer, multimedia, Facilities manager, and Handouts Education comprehension: verbalized understanding, returned demonstration, and needs further education  HOME EXERCISE PROGRAM: Access Code: [email or web address] URL: [email or web address] Date: 10/03/2022 Prepared by: Carlynn Herald  Exercises - Small Range Straight Leg Raise  - 1 x daily - 7 x weekly - 1 sets - 10 reps - Prone Knee Flexion  - 1 x daily - 7 x weekly - 1 sets - 10 reps - Supine Bridge with Mini Swiss Ball Between Knees  - 1 x daily - 7 x weekly - 3 sets - 10 reps - Clam with Resistance  - 1 x daily - 7 x weekly - 3 sets - 10 reps - Prone Heel Squeeze  - 1 x daily - 7 x weekly - 3 sets - 10 reps - Side Stepping with Resistance at Ankles and Counter Support  - 1 x daily - 7 x weekly - 3 sets - 10 reps - Long Sitting Isometric Ankle Eversion in Dorsiflexion with Ball at Wall  - 1 x daily - 7 x weekly - 2 sets - 10 reps - 5 sec hold   GOALS: Goals reviewed with patient? Yes  SHORT TERM GOALS: Target date: 10/24/2022  Pt will be ind with initial strength and balance HEP Baseline: Goal status: INITIAL  2.  Pt will be able to perform 5x STS without UE assist Baseline:  Goal status: INITIAL  3.  Pt will have improved DGI to >/=18/24 Baseline:  Goal status: INITIAL   LONG TERM GOALS: Target date: 11/21/2022  Pt will be ind with management and progression of HEP and continue community wellness Baseline:  Goal status: INITIAL  2.  Pt will be able to perform 5x STS in </=13 sec to demo decreased fall risk Baseline:  Goal status: INITIAL  3.  Pt will be able to demo improved DGI to >/=22/24 for decreased fall risk Baseline:  Goal status: INITIAL  4.  Pt will be able to amb grocery store (>1000') independently Baseline:  Goal status: INITIAL  5.  Pt will be able to demo at least 4/5 bilat LE  strength per pt's personal goals Baseline:  Goal status: INITIAL   ASSESSMENT:  CLINICAL IMPRESSION:  Improved strength and mobility noted during prone knee flexion. Hip flexion strength and foot clearance progressed with fwd/bkwd stepping over high blocks. Resources provided for patient on multiple sclerosis for personal research.  EVAL: Patient is a 77 y.o. F who was seen today for physical therapy evaluation and treatment for new diagnosis of MS with gait instability. Pt notes history of TIA in 2022 which may have been her initial MS exacerbation. Assessment significant for R LE weakness, decreased ROM, decreased balance with high fall risk, and decreased endurance affecting home and community mobility. Pt will benefit from PT to address these deficits for continued safe participation in recreational activities.   OBJECTIVE IMPAIRMENTS: Abnormal gait, decreased activity tolerance, decreased balance, decreased endurance, decreased mobility, difficulty walking, decreased ROM, decreased strength, impaired UE functional use, and pain.   ACTIVITY LIMITATIONS: carrying, lifting, standing, squatting, sleeping, stairs, and locomotion level  PARTICIPATION LIMITATIONS: meal prep, cleaning, shopping, community activity, and yard work  PERSONAL FACTORS: Fitness, Past/current experiences, and Time since onset of injury/illness/exacerbation are also affecting patient's functional outcome.   REHAB POTENTIAL: Good  CLINICAL DECISION MAKING: Evolving/moderate complexity  EVALUATION COMPLEXITY: Moderate  PLAN:  PT FREQUENCY: 1-2x/week (initially 2x/wk for 4 weeks with plans to wean to 1x/wk)  PT DURATION: 8 weeks  PLANNED INTERVENTIONS: Therapeutic exercises, Therapeutic activity, Neuromuscular re-education, Balance training, Gait training, Patient/Family education, Self Care, Joint mobilization, Stair training, Dry Needling, Electrical stimulation, Cryotherapy, Moist heat, Taping, Ionotophoresis  4mg /ml Dexamethasone, Manual therapy,  and Re-evaluation  PLAN FOR NEXT SESSION: Work on general LE strengthening and balance    Sanjuana Mae, PTA 10/10/2022, 12:53 PM

## 2022-10-15 ENCOUNTER — Ambulatory Visit: Payer: Medicare HMO | Admitting: Physical Therapy

## 2022-10-15 ENCOUNTER — Encounter: Payer: Self-pay | Admitting: Physical Therapy

## 2022-10-15 DIAGNOSIS — M6281 Muscle weakness (generalized): Secondary | ICD-10-CM

## 2022-10-15 DIAGNOSIS — R2681 Unsteadiness on feet: Secondary | ICD-10-CM

## 2022-10-15 DIAGNOSIS — R262 Difficulty in walking, not elsewhere classified: Secondary | ICD-10-CM

## 2022-10-15 NOTE — Therapy (Signed)
OUTPATIENT PHYSICAL THERAPY NEURO TREATMENT   Patient Name: Melinda Mckay MRN: [id number] DOB:Apr 25, 1945, 77 y.o., female Today's Date: 10/15/2022   PCP: Harvest Forest, MD REFERRING PROVIDER:   Asa Lente, MD    END OF SESSION:  PT End of Session - 10/15/22 1444     Visit Number 6    Number of Visits 16    Date for PT Re-Evaluation 11/21/22    Authorization Type [employer] Medicare    Progress Note Due on Visit 10    PT Start Time 1445    PT Stop Time 1525    PT Time Calculation (min) 40 min    Activity Tolerance Patient tolerated treatment well    Behavior During Therapy WFL for tasks assessed/performed              Past Medical History:  Diagnosis Date   Anxiety    Asthma    Diabetes mellitus without complication (HCC)    Edema of lower extremity    Hyperlipidemia    Hypertension    Prediabetes    Restless legs    Past Surgical History:  Procedure Laterality Date   bone spur removal Right    BUNIONECTOMY Bilateral 2005   PARTIAL HYSTERECTOMY  2008   TUBAL LIGATION  1982   Patient Active Problem List   Diagnosis Date Noted   Cerebrovascular accident (CVA) due to occlusion of small artery (HCC) 10/30/2020   Numbness and tingling in right hand 10/30/2020   Acute stroke due to ischemia (HCC) 06/29/2020   Snoring 06/29/2020   Sleep apnea in adult 06/29/2020   Mild intermittent asthma 06/29/2020   Hypokalemia 04/24/2020   Lab test positive for detection of COVID-19 virus 04/24/2020   TIA (transient ischemic attack) 04/23/2020   Diverticulosis of colon 01/16/2018   Internal hemorrhoids 01/16/2018   Tubular adenoma 01/16/2018   Paresthesias 04/28/2017   Anxiety disorder 11/05/2016   Edema of lower extremity 11/05/2016   Essential hypertension 11/05/2016   Hyperlipidemia 11/05/2016   Prediabetes 11/05/2016   Restless legs 11/05/2016   Mild persistent asthma in adult without complication 12/12/2011    ONSET DATE: ~Feb 2022  REFERRING  DIAG:  G37.9 (ICD-10-CM) - Demyelinating disease of the spinal cord (HCC)  R29.898 (ICD-10-CM) - Right leg weakness  R26.9 (ICD-10-CM) - Gait disturbance    THERAPY DIAG:  Muscle weakness (generalized)  Difficulty in walking, not elsewhere classified  Unsteadiness on feet  Rationale for Evaluation and Treatment: Rehabilitation  SUBJECTIVE:  SUBJECTIVE STATEMENT: Pt states she is feeling tired and achy today -- thinks may be achy. Did additional exercises with her trainer. States she is sore in her shoulders. Has been doing about 5-8 lbs for upper body exercises.   EVAL: Pt reports she saw [name] (Neurologist) on 09/09/22. Pt had MRI and was diagnosed with MS. Pt is to have a spinal tap to decide further management. Pt reports increased R UE and LE weakness since February 2022 when she initially thought she had a TIA. As time went on, pt reports increased complications on her R side. Pt states she is working with a Systems analyst and silver sneakers (2x/wk). Has started water aerobics in early July but has not been consistent due to vacation and schedule. Pt does note some aching bilat hip pain that can radiate when she's in bed but otherwise no other abnormal sensations or pain. She has a cane but does not use all the time and tries not to use regularly. Pt accompanied by: self  PERTINENT HISTORY: From Dr. [name] referral notes: 77 yo woman with probable MS and gait disorder and minimal right weakness   PAIN:  Are you having pain? None  PRECAUTIONS: None  RED FLAGS: None   WEIGHT BEARING RESTRICTIONS: No  FALLS: Has patient fallen in last 6 months? No  LIVING ENVIRONMENT: Lives with: lives alone; has daughters (live in [place]), 3 sisters in [place] Lives in: House/apartment Stairs:  No Has upstairs but doesn't need to go up there Has following equipment at home: Single point cane, 4 prong cane, walker  PLOF: Independent  PATIENT GOALS: Decrease use of cane, strengthen bilat LEs  OBJECTIVE:   DIAGNOSTIC FINDINGS: Recent MRI 09/02/22: IMPRESSION: Findings consistent with the clinical diagnosis of multiple sclerosis. Compared to prior MRI, there is interval progression of the T2 hyperintense lesions in the bilateral corona radiata and genu of the corpus callosum. No focus of abnormal contrast enhancement to suggest active demyelination.  SENSATION: R hand cold and numb  LOWER EXTREMITY ROM:     Active  Right Eval Left Eval  Hip flexion 85 >100  Hip extension    Hip abduction    Hip adduction    Hip internal rotation    Hip external rotation    Knee flexion    Knee extension    Ankle dorsiflexion    Ankle plantarflexion    Ankle inversion    Ankle eversion     (Blank rows = not tested)  LOWER EXTREMITY MMT:    MMT Right Eval Left Eval  Hip flexion 3- 4  Hip extension 3+ 3+  Hip abduction 2+ 3+  Hip adduction    Hip internal rotation    Hip external rotation    Knee flexion 3+ 5  Knee extension 3+ 5  Ankle dorsiflexion    Ankle plantarflexion    Ankle inversion    Ankle eversion    Grip strength 22# 40#  (Blank rows = not tested)  BED MOBILITY:  Independent  TRANSFERS: Assistive device utilized: None  Sit to stand: Complete Independence Stand to sit: Complete Independence  STAIRS: Level of Assistance: CGA Stair Negotiation Technique: Alternating Pattern  with Single Rail on Left Number of Stairs: 5 steps x 3  Height of Stairs: 4"-6"   Comments: Mild LOB when attempted no rail with descent  GAIT: Gait pattern: step through pattern, decreased step length- Right, decreased stance time- Right, decreased ankle dorsiflexion- Right, Right foot flat, lateral lean- Right, and  poor foot clearance- Right Increased R knee valgus during  stance Distance walked: Multiple laps in clinic during DGI Assistive device utilized: None Level of assistance: CGA  FUNCTIONAL TESTS:  5 times sit to stand: 19.5 sec (needed use of UEs) Dynamic Gait Index: 16/24    TODAY'S TREATMENT:   [facility] Adult PT Treatment:                                                DATE: 10/15/22 Therapeutic Exercise: Recumbent bike L3 x 3 min Gastroc stretch against wall x30 sec Leg press 65# double leg 3x10 (red band around knees) Pball roll knee flex/ext 2x10 (working on control) Knee bent foot on pball, hip IR/ER 2x10 Foot on pball SLR off it 2x10 Sidelying clamshell 2x10 Prone hip ext knee flexed 2x10    [facility] Adult PT Treatment:                                                DATE: 10/10/2022 Therapeutic Exercise: Recumbent bike L3 x 3 min STS no HHA, RTB above knees (R hip abd iso) 3x10 Prone knee flexion (R) 2x8 Leading R leg:  Front step up/down 8" stepper 2x10 Lateral step up/down (straddling step) 8" stepper 2x10 Walking stepping over high blocks R leg step over high blocks fwd/bkwd  Self Care: MS online resources Slow marching next to wall for HEP to challenge balance and strength   [facility] Adult PT Treatment:                                                DATE: 10/07/2022 Therapeutic Exercise: Recumbent bike L2 x 3 min Seated edge of table: hip flexor stretch Quadruped: Fwd/bkwd rocking x10 Leg extension (toe on ground) x5 each leg Hydrants x10 (B) Leg extension kick out x10 (B) Seated LAQ + ball squeeze 10x3" (B) Long sitting isometric ankle DF/ever with ball against wall 5x5" Prone knee flexion (R) --> min A --> active assist with strap                          PATIENT EDUCATION: Education details: Updated HEP Person educated: Patient Education method: Explanation, Demonstration, and Handouts Education comprehension: verbalized understanding, returned demonstration, and needs further education  HOME EXERCISE PROGRAM: Access  Code: [email or web address] URL: [email or web address] Date: 10/03/2022 Prepared by: Carlynn Herald  Exercises - Small Range Straight Leg Raise  - 1 x daily - 7 x weekly - 1 sets - 10 reps - Prone Knee Flexion  - 1 x daily - 7 x weekly - 1 sets - 10 reps - Supine Bridge with Mini Swiss Ball Between Knees  - 1 x daily - 7 x weekly - 3 sets - 10 reps - Clam with Resistance  - 1 x daily - 7 x weekly - 3 sets - 10 reps - Prone Heel Squeeze  - 1 x daily - 7 x weekly - 3 sets - 10 reps - Side Stepping with Resistance at Ankles and Counter Support  - 1 x daily - 7 x weekly -  3 sets - 10 reps - Long Sitting Isometric Ankle Eversion in Dorsiflexion with Ball at Wall  - 1 x daily - 7 x weekly - 2 sets - 10 reps - 5 sec hold   GOALS: Goals reviewed with patient? Yes  SHORT TERM GOALS: Target date: 10/24/2022  Pt will be ind with initial strength and balance HEP Baseline: Goal status: INITIAL  2.  Pt will be able to perform 5x STS without UE assist Baseline:  Goal status: INITIAL  3.  Pt will have improved DGI to >/=18/24 Baseline:  Goal status: INITIAL   LONG TERM GOALS: Target date: 11/21/2022  Pt will be ind with management and progression of HEP and continue community wellness Baseline:  Goal status: INITIAL  2.  Pt will be able to perform 5x STS in </=13 sec to demo decreased fall risk Baseline:  Goal status: INITIAL  3.  Pt will be able to demo improved DGI to >/=22/24 for decreased fall risk Baseline:  Goal status: INITIAL  4.  Pt will be able to amb grocery store (>1000') independently Baseline:  Goal status: INITIAL  5.  Pt will be able to demo at least 4/5 bilat LE strength per pt's personal goals Baseline:  Goal status: INITIAL   ASSESSMENT:  CLINICAL IMPRESSION:  Continued to work on hip control and strength. Difficulty keeping R LE from wobbling when flexing/extending knee with her foot on pball. Remains weak with glute activation.   EVAL: Patient is a 77  y.o. F who was seen today for physical therapy evaluation and treatment for new diagnosis of MS with gait instability. Pt notes history of TIA in 2022 which may have been her initial MS exacerbation. Assessment significant for R LE weakness, decreased ROM, decreased balance with high fall risk, and decreased endurance affecting home and community mobility. Pt will benefit from PT to address these deficits for continued safe participation in recreational activities.   OBJECTIVE IMPAIRMENTS: Abnormal gait, decreased activity tolerance, decreased balance, decreased endurance, decreased mobility, difficulty walking, decreased ROM, decreased strength, impaired UE functional use, and pain.    PLAN:  PT FREQUENCY: 1-2x/week (initially 2x/wk for 4 weeks with plans to wean to 1x/wk)  PT DURATION: 8 weeks  PLANNED INTERVENTIONS: Therapeutic exercises, Therapeutic activity, Neuromuscular re-education, Balance training, Gait training, Patient/Family education, Self Care, Joint mobilization, Stair training, Dry Needling, Electrical stimulation, Cryotherapy, Moist heat, Taping, Ionotophoresis 4mg /ml Dexamethasone, Manual therapy, and Re-evaluation  PLAN FOR NEXT SESSION: Work on general LE strengthening and balance    Symone Cornman April Ma L Eligah Anello, PT 10/15/2022, 2:44 PM

## 2022-10-17 ENCOUNTER — Ambulatory Visit: Payer: Medicare HMO

## 2022-10-17 ENCOUNTER — Other Ambulatory Visit: Payer: Self-pay

## 2022-10-17 DIAGNOSIS — M6281 Muscle weakness (generalized): Secondary | ICD-10-CM | POA: Diagnosis not present

## 2022-10-17 DIAGNOSIS — R2681 Unsteadiness on feet: Secondary | ICD-10-CM

## 2022-10-17 DIAGNOSIS — R262 Difficulty in walking, not elsewhere classified: Secondary | ICD-10-CM

## 2022-10-17 NOTE — Therapy (Signed)
OUTPATIENT PHYSICAL THERAPY NEURO TREATMENT   Patient Name: Melinda Mckay MRN: [id number] DOB:1945/05/20, 77 y.o., female Today's Date: 10/17/2022   PCP: Harvest [place], MD REFERRING PROVIDER:   Asa Lente, MD    END OF SESSION:  PT End of Session - 10/17/22 1548     Visit Number 7    Number of Visits 16    Date for PT Re-Evaluation 11/21/22    Authorization Type [employer]    Progress Note Due on Visit 10    PT Start Time 1445    PT Stop Time 1530    PT Time Calculation (min) 45 min               Past Medical History:  Diagnosis Date   Anxiety    Asthma    Diabetes mellitus without complication (HCC)    Edema of lower extremity    Hyperlipidemia    Hypertension    Prediabetes    Restless legs    Past Surgical History:  Procedure Laterality Date   bone spur removal Right    BUNIONECTOMY Bilateral 2005   PARTIAL HYSTERECTOMY  2008   TUBAL LIGATION  1982   Patient Active Problem List   Diagnosis Date Noted   Cerebrovascular accident (CVA) due to occlusion of small artery (HCC) 10/30/2020   Numbness and tingling in right hand 10/30/2020   Acute stroke due to ischemia (HCC) 06/29/2020   Snoring 06/29/2020   Sleep apnea in adult 06/29/2020   Mild intermittent asthma 06/29/2020   Hypokalemia 04/24/2020   Lab test positive for detection of COVID-19 virus 04/24/2020   TIA (transient ischemic attack) 04/23/2020   Diverticulosis of colon 01/16/2018   Internal hemorrhoids 01/16/2018   Tubular adenoma 01/16/2018   Paresthesias 04/28/2017   Anxiety disorder 11/05/2016   Edema of lower extremity 11/05/2016   Essential hypertension 11/05/2016   Hyperlipidemia 11/05/2016   Prediabetes 11/05/2016   Restless legs 11/05/2016   Mild persistent asthma in adult without complication 12/12/2011    ONSET DATE: ~Feb 2022  REFERRING DIAG:  G37.9 (ICD-10-CM) - Demyelinating disease of the spinal cord (HCC)  R29.898 (ICD-10-CM) - Right leg weakness   R26.9 (ICD-10-CM) - Gait disturbance    THERAPY DIAG:  Muscle weakness (generalized)  Difficulty in walking, not elsewhere classified  Unsteadiness on feet  Rationale for Evaluation and Treatment: Rehabilitation  SUBJECTIVE:                                                                                                                                                                                             SUBJECTIVE STATEMENT: Patient  stated she has been continuing with her personal trainer, focusing on upper body - strengthening shoulders and core, back. Going pretty good. She has been doing her home exercises with bands and tries to go up and down steps regularly to maintain the ability. She is using the hand rail. She noted her balance varies based on the day - she did not have use cane today but does need to use the cane on some days.   EVAL: Pt reports she saw [name] (Neurologist) on 09/09/22. Pt had MRI and was diagnosed with MS. Pt is to have a spinal tap to decide further management. Pt reports increased R UE and LE weakness since February 2022 when she initially thought she had a TIA. As time went on, pt reports increased complications on her R side. Pt states she is working with a Systems analyst and silver sneakers (2x/wk). Has started water aerobics in early July but has not been consistent due to vacation and schedule. Pt does note some aching bilat hip pain that can radiate when she's in bed but otherwise no other abnormal sensations or pain. She has a cane but does not use all the time and tries not to use regularly. Pt accompanied by: self  PERTINENT HISTORY: From Dr. [name] referral notes: 77 yo woman with probable MS and gait disorder and minimal right weakness   PAIN:  Are you having pain? None  PRECAUTIONS: None  RED FLAGS: None   WEIGHT BEARING RESTRICTIONS: No  FALLS: Has patient fallen in last 6 months? No  LIVING ENVIRONMENT: Lives with: lives  alone; has daughters (live in [place]), 3 sisters in [place] Lives in: House/apartment Stairs: No Has upstairs but doesn't need to go up there Has following equipment at home: Single point cane, 4 prong cane, walker  PLOF: Independent  PATIENT GOALS: Decrease use of cane, strengthen bilat LEs  OBJECTIVE:   DIAGNOSTIC FINDINGS: Recent MRI 09/02/22: IMPRESSION: Findings consistent with the clinical diagnosis of multiple sclerosis. Compared to prior MRI, there is interval progression of the T2 hyperintense lesions in the bilateral corona radiata and genu of the corpus callosum. No focus of abnormal contrast enhancement to suggest active demyelination.  SENSATION: R hand cold and numb  LOWER EXTREMITY ROM:     Active  Right Eval Left Eval  Hip flexion 85 >100  Hip extension    Hip abduction    Hip adduction    Hip internal rotation    Hip external rotation    Knee flexion    Knee extension    Ankle dorsiflexion    Ankle plantarflexion    Ankle inversion    Ankle eversion     (Blank rows = not tested)  LOWER EXTREMITY MMT:    MMT Right Eval Left Eval  Hip flexion 3- 4  Hip extension 3+ 3+  Hip abduction 2+ 3+  Hip adduction    Hip internal rotation    Hip external rotation    Knee flexion 3+ 5  Knee extension 3+ 5  Ankle dorsiflexion    Ankle plantarflexion    Ankle inversion    Ankle eversion    Grip strength 22# 40#  (Blank rows = not tested)  BED MOBILITY:  Independent  TRANSFERS: Assistive device utilized: None  Sit to stand: Complete Independence Stand to sit: Complete Independence  STAIRS: Level of Assistance: CGA Stair Negotiation Technique: Alternating Pattern  with Single Rail on Left Number of Stairs: 5 steps x 3  Height of Stairs:  4"-6"   Comments: Mild LOB when attempted no rail with descent  GAIT: Gait pattern: step through pattern, decreased step length- Right, decreased stance time- Right, decreased ankle dorsiflexion- Right,  Right foot flat, lateral lean- Right, and poor foot clearance- Right Increased R knee valgus during stance Distance walked: Multiple laps in clinic during DGI Assistive device utilized: None Level of assistance: CGA  FUNCTIONAL TESTS:  5 times sit to stand: 19.5 sec (needed use of UEs) Dynamic Gait Index: 16/24    TODAY'S TREATMENT:   [facility] Adult PT Treatment:                                                DATE: 10/17/2022  Therapeutic Exercise: Seated Fitter board hamstring curls, 1 band, 2x10 reps, performed R and L Supine short arc quad isometric hold drop sets, 14lbs/9lbs/5lbs/2lbs, removing each weight at fatigue, performed R and L Neuromuscular re-ed: Airex pad double limb balance, shoulder width stance, +/- eyes open, eyes closed, head turns R and L, head turns up and down Airex pad double limb balance, narrow stance, +/- eyes open, eyes closed Trialed assisted foam roller step ups - discontinue due to being too advanced BAPS board double limb balance, level 2 ball, upper extremity assist as needed BAPS board double limb balance with clinician perturbations on board, level 1 ball, upper extremity assist as needed  [facility] Adult PT Treatment:                                                DATE: 10/15/22 Therapeutic Exercise: Recumbent bike L3 x 3 min Gastroc stretch against wall x30 sec Leg press 65# double leg 3x10 (red band around knees) Pball roll knee flex/ext 2x10 (working on control) Knee bent foot on pball, hip IR/ER 2x10 Foot on pball SLR off it 2x10 Sidelying clamshell 2x10 Prone hip ext knee flexed 2x10    [facility] Adult PT Treatment:                                                DATE: 10/10/2022 Therapeutic Exercise: Recumbent bike L3 x 3 min STS no HHA, RTB above knees (R hip abd iso) 3x10 Prone knee flexion (R) 2x8 Leading R leg:  Front step up/down 8" stepper 2x10 Lateral step up/down (straddling step) 8" stepper 2x10 Walking stepping over high blocks R leg step  over high blocks fwd/bkwd  Self Care: MS online resources Slow marching next to wall for HEP to challenge balance and strength   [facility] Adult PT Treatment:                                                DATE: 10/07/2022 Therapeutic Exercise: Recumbent bike L2 x 3 min Seated edge of table: hip flexor stretch Quadruped: Fwd/bkwd rocking x10 Leg extension (toe on ground) x5 each leg Hydrants x10 (B) Leg extension kick out x10 (B) Seated LAQ + ball squeeze 10x3" (B) Long sitting isometric ankle  DF/ever with ball against wall 5x5" Prone knee flexion (R) --> min A --> active assist with strap                          PATIENT EDUCATION: Education details: Updated HEP Person educated: Patient Education method: Explanation, Demonstration, and Handouts Education comprehension: verbalized understanding, returned demonstration, and needs further education  HOME EXERCISE PROGRAM: Access Code: [email or web address] URL: [email or web address] Date: 10/03/2022 Prepared by: Carlynn Herald  Exercises - Small Range Straight Leg Raise  - 1 x daily - 7 x weekly - 1 sets - 10 reps - Prone Knee Flexion  - 1 x daily - 7 x weekly - 1 sets - 10 reps - Supine Bridge with Mini Swiss Ball Between Knees  - 1 x daily - 7 x weekly - 3 sets - 10 reps - Clam with Resistance  - 1 x daily - 7 x weekly - 3 sets - 10 reps - Prone Heel Squeeze  - 1 x daily - 7 x weekly - 3 sets - 10 reps - Side Stepping with Resistance at Ankles and Counter Support  - 1 x daily - 7 x weekly - 3 sets - 10 reps - Long Sitting Isometric Ankle Eversion in Dorsiflexion with Ball at Wall  - 1 x daily - 7 x weekly - 2 sets - 10 reps - 5 sec hold   GOALS: Goals reviewed with patient? Yes  SHORT TERM GOALS: Target date: 10/24/2022  Pt will be ind with initial strength and balance HEP Baseline: Goal status: INITIAL  2.  Pt will be able to perform 5x STS without UE assist Baseline:  Goal status: INITIAL  3.  Pt will have improved  DGI to >/=18/24 Baseline:  Goal status: INITIAL   LONG TERM GOALS: Target date: 11/21/2022  Pt will be ind with management and progression of HEP and continue community wellness Baseline:  Goal status: INITIAL  2.  Pt will be able to perform 5x STS in </=13 sec to demo decreased fall risk Baseline:  Goal status: INITIAL  3.  Pt will be able to demo improved DGI to >/=22/24 for decreased fall risk Baseline:  Goal status: INITIAL  4.  Pt will be able to amb grocery store (>1000') independently Baseline:  Goal status: INITIAL  5.  Pt will be able to demo at least 4/5 bilat LE strength per pt's personal goals Baseline:  Goal status: INITIAL   ASSESSMENT:  CLINICAL IMPRESSION: Balance exercises on Airex pad appeared sufficiently challenging causing postural sway but no loss of balance. Upper extremity assist was needed to perform BAPS board exercises appropriately. Fitter hamstring curls were much more difficult on the L compared to the R. Short arc quad isometrics were challenging due to the length of the holds but greater challenge to the quadriceps will be appropriate at future sessions. Physical therapy remains indicated.  EVAL: Patient is a 77 y.o. F who was seen today for physical therapy evaluation and treatment for new diagnosis of MS with gait instability. Pt notes history of TIA in 2022 which may have been her initial MS exacerbation. Assessment significant for R LE weakness, decreased ROM, decreased balance with high fall risk, and decreased endurance affecting home and community mobility. Pt will benefit from PT to address these deficits for continued safe participation in recreational activities.   OBJECTIVE IMPAIRMENTS: Abnormal gait, decreased activity tolerance, decreased balance, decreased endurance, decreased mobility, difficulty walking, decreased ROM, decreased strength,  impaired UE functional use, and pain.    PLAN:  PT FREQUENCY: 1-2x/week (initially 2x/wk for  4 weeks with plans to wean to 1x/wk)  PT DURATION: 8 weeks  PLANNED INTERVENTIONS: Therapeutic exercises, Therapeutic activity, Neuromuscular re-education, Balance training, Gait training, Patient/Family education, Self Care, Joint mobilization, Stair training, Dry Needling, Electrical stimulation, Cryotherapy, Moist heat, Taping, Ionotophoresis 4mg /ml Dexamethasone, Manual therapy, and Re-evaluation  PLAN FOR NEXT SESSION: Work on general LE strengthening and balance   Edmonia Caprio, PT, PhD, DPT  10/17/2022, 3:52 PM

## 2022-10-21 ENCOUNTER — Other Ambulatory Visit: Payer: Self-pay | Admitting: Neurology

## 2022-10-21 ENCOUNTER — Telehealth: Payer: Self-pay | Admitting: Neurology

## 2022-10-21 DIAGNOSIS — G379 Demyelinating disease of central nervous system, unspecified: Secondary | ICD-10-CM

## 2022-10-21 DIAGNOSIS — R202 Paresthesia of skin: Secondary | ICD-10-CM

## 2022-10-21 NOTE — Telephone Encounter (Signed)
I called patient. [name] ordered the LP. I advised her that GI would call her in about one week. Pt verbalized understanding.

## 2022-10-21 NOTE — Telephone Encounter (Signed)
At 9:56 Pt left a vm stating she would like a call for assistance in getting scheduled for her spinal tap, she is asking to be called after 12:15 at [phone]

## 2022-10-28 NOTE — Therapy (Signed)
OUTPATIENT PHYSICAL THERAPY NEURO TREATMENT   Patient Name: Melinda Mckay MRN: [id number] DOB:November 13, 1945, 77 y.o., female Today's Date: 10/29/2022   PCP: Harvest [place], MD REFERRING PROVIDER:   Asa Lente, MD    END OF SESSION:  PT End of Session - 10/29/22 1532     Visit Number 8    Date for PT Re-Evaluation 11/21/22    Authorization Type [employer]    Progress Note Due on Visit 10    PT Start Time 1447    PT Stop Time 1532    PT Time Calculation (min) 45 min    Activity Tolerance Patient tolerated treatment well    Behavior During Therapy WFL for tasks assessed/performed                Past Medical History:  Diagnosis Date   Anxiety    Asthma    Diabetes mellitus without complication (HCC)    Edema of lower extremity    Hyperlipidemia    Hypertension    Prediabetes    Restless legs    Past Surgical History:  Procedure Laterality Date   bone spur removal Right    BUNIONECTOMY Bilateral 2005   PARTIAL HYSTERECTOMY  2008   TUBAL LIGATION  1982   Patient Active Problem List   Diagnosis Date Noted   Cerebrovascular accident (CVA) due to occlusion of small artery (HCC) 10/30/2020   Numbness and tingling in right hand 10/30/2020   Acute stroke due to ischemia (HCC) 06/29/2020   Snoring 06/29/2020   Sleep apnea in adult 06/29/2020   Mild intermittent asthma 06/29/2020   Hypokalemia 04/24/2020   Lab test positive for detection of COVID-19 virus 04/24/2020   TIA (transient ischemic attack) 04/23/2020   Diverticulosis of colon 01/16/2018   Internal hemorrhoids 01/16/2018   Tubular adenoma 01/16/2018   Paresthesias 04/28/2017   Anxiety disorder 11/05/2016   Edema of lower extremity 11/05/2016   Essential hypertension 11/05/2016   Hyperlipidemia 11/05/2016   Prediabetes 11/05/2016   Restless legs 11/05/2016   Mild persistent asthma in adult without complication 12/12/2011    ONSET DATE: ~Feb 2022  REFERRING DIAG:  G37.9  (ICD-10-CM) - Demyelinating disease of the spinal cord (HCC)  R29.898 (ICD-10-CM) - Right leg weakness  R26.9 (ICD-10-CM) - Gait disturbance    THERAPY DIAG:  Muscle weakness (generalized)  Difficulty in walking, not elsewhere classified  Unsteadiness on feet  Rationale for Evaluation and Treatment: Rehabilitation  SUBJECTIVE:  SUBJECTIVE STATEMENT: Patient is still struggling with fatigue.   EVAL: Pt reports she saw [name] (Neurologist) on 09/09/22. Pt had MRI and was diagnosed with MS. Pt is to have a spinal tap to decide further management. Pt reports increased R UE and LE weakness since February 2022 when she initially thought she had a TIA. As time went on, pt reports increased complications on her R side. Pt states she is working with a Systems analyst and silver sneakers (2x/wk). Has started water aerobics in early July but has not been consistent due to vacation and schedule. Pt does note some aching bilat hip pain that can radiate when she's in bed but otherwise no other abnormal sensations or pain. She has a cane but does not use all the time and tries not to use regularly. Pt accompanied by: self  PERTINENT HISTORY: From Dr. [name] referral notes: 77 yo woman with probable MS and gait disorder and minimal right weakness   PAIN:  Are you having pain? None  PRECAUTIONS: None  RED FLAGS: None   WEIGHT BEARING RESTRICTIONS: No  FALLS: Has patient fallen in last 6 months? No  LIVING ENVIRONMENT: Lives with: lives alone; has daughters (live in [place]), 3 sisters in [place] Lives in: House/apartment Stairs: No Has upstairs but doesn't need to go up there Has following equipment at home: Single point cane, 4 prong cane, walker  PLOF: Independent  PATIENT GOALS: Decrease use of  cane, strengthen bilat LEs  OBJECTIVE:   DIAGNOSTIC FINDINGS: Recent MRI 09/02/22: IMPRESSION: Findings consistent with the clinical diagnosis of multiple sclerosis. Compared to prior MRI, there is interval progression of the T2 hyperintense lesions in the bilateral corona radiata and genu of the corpus callosum. No focus of abnormal contrast enhancement to suggest active demyelination.  SENSATION: R hand cold and numb  LOWER EXTREMITY ROM:     Active  Right Eval Left Eval  Hip flexion 85 >100  Hip extension    Hip abduction    Hip adduction    Hip internal rotation    Hip external rotation    Knee flexion    Knee extension    Ankle dorsiflexion    Ankle plantarflexion    Ankle inversion    Ankle eversion     (Blank rows = not tested)  LOWER EXTREMITY MMT:    MMT Right Eval Left Eval  Hip flexion 3- 4  Hip extension 3+ 3+  Hip abduction 2+ 3+  Hip adduction    Hip internal rotation    Hip external rotation    Knee flexion 3+ 5  Knee extension 3+ 5  Ankle dorsiflexion    Ankle plantarflexion    Ankle inversion    Ankle eversion    Grip strength 22# 40#  (Blank rows = not tested)  BED MOBILITY:  Independent  TRANSFERS: Assistive device utilized: None  Sit to stand: Complete Independence Stand to sit: Complete Independence  STAIRS: Level of Assistance: CGA Stair Negotiation Technique: Alternating Pattern  with Single Rail on Left Number of Stairs: 5 steps x 3  Height of Stairs: 4"-6"   Comments: Mild LOB when attempted no rail with descent  GAIT: Gait pattern: step through pattern, decreased step length- Right, decreased stance time- Right, decreased ankle dorsiflexion- Right, Right foot flat, lateral lean- Right, and poor foot clearance- Right Increased R knee valgus during stance Distance walked: Multiple laps in clinic during DGI Assistive device utilized: None Level of assistance: CGA  FUNCTIONAL TESTS:  5  times sit to stand: 19.5 sec (needed  use of UEs) Dynamic Gait Index: 16/24    TODAY'S TREATMENT:   [facility] Adult PT Treatment:                                                DATE: 10/29/2022  Therapeutic Exercise: Bike 2 min at L 3, then L1 x 4 min Supine short arc quad isometric hold drop sets, 12.5lbs/9.5lbs/5lbs/2lbs on L, 14lbs/9.5 lbs/5lbs/2lbs on R removing each weight at fatigue, Neuromuscular re-ed: Airex pad double limb balance, shoulder width stance, +/- eyes open, eyes closed, head turns R and L, head turns up and down Airex pad double limb balance, narrow stance, +/- eyes open, eyes closed Self Care: Discussion of energy level and diet. Patient looking into shake supplement.   [facility] Adult PT Treatment:                                                DATE: 10/17/2022  Therapeutic Exercise: Seated Fitter board hamstring curls, 1 band, 2x10 reps, performed R and L Supine short arc quad isometric hold drop sets, 14lbs/9lbs/5lbs/2lbs, removing each weight at fatigue, performed R and L Neuromuscular re-ed: Airex pad double limb balance, shoulder width stance, +/- eyes open, eyes closed, head turns R and L, head turns up and down Airex pad double limb balance, narrow stance, +/- eyes open, eyes closed Trialed assisted foam roller step ups - discontinue due to being too advanced BAPS board double limb balance, level 2 ball, upper extremity assist as needed BAPS board double limb balance with clinician perturbations on board, level 1 ball, upper extremity assist as needed  [facility] Adult PT Treatment:                                                DATE: 10/15/22 Therapeutic Exercise: Recumbent bike L3 x 3 min Gastroc stretch against wall x30 sec Leg press 65# double leg 3x10 (red band around knees) Pball roll knee flex/ext 2x10 (working on control) Knee bent foot on pball, hip IR/ER 2x10 Foot on pball SLR off it 2x10 Sidelying clamshell 2x10 Prone hip ext knee flexed 2x10    [facility] Adult PT Treatment:                                                 DATE: 10/10/2022 Therapeutic Exercise: Recumbent bike L3 x 3 min STS no HHA, RTB above knees (R hip abd iso) 3x10 Prone knee flexion (R) 2x8 Leading R leg:  Front step up/down 8" stepper 2x10 Lateral step up/down (straddling step) 8" stepper 2x10 Walking stepping over high blocks R leg step over high blocks fwd/bkwd  Self Care: MS online resources Slow marching next to wall for HEP to challenge balance and strength   [facility] Adult PT Treatment:  DATE: 10/07/2022 Therapeutic Exercise: Recumbent bike L2 x 3 min Seated edge of table: hip flexor stretch Quadruped: Fwd/bkwd rocking x10 Leg extension (toe on ground) x5 each leg Hydrants x10 (B) Leg extension kick out x10 (B) Seated LAQ + ball squeeze 10x3" (B) Long sitting isometric ankle DF/ever with ball against wall 5x5" Prone knee flexion (R) --> min A --> active assist with strap                          PATIENT EDUCATION: Education details: Updated HEP Person educated: Patient Education method: Explanation, Demonstration, and Handouts Education comprehension: verbalized understanding, returned demonstration, and needs further education  HOME EXERCISE PROGRAM: Access Code: [email or web address] URL: [email or web address] Date: 10/03/2022 Prepared by: Carlynn Herald  Exercises - Small Range Straight Leg Raise  - 1 x daily - 7 x weekly - 1 sets - 10 reps - Prone Knee Flexion  - 1 x daily - 7 x weekly - 1 sets - 10 reps - Supine Bridge with Mini Swiss Ball Between Knees  - 1 x daily - 7 x weekly - 3 sets - 10 reps - Clam with Resistance  - 1 x daily - 7 x weekly - 3 sets - 10 reps - Prone Heel Squeeze  - 1 x daily - 7 x weekly - 3 sets - 10 reps - Side Stepping with Resistance at Ankles and Counter Support  - 1 x daily - 7 x weekly - 3 sets - 10 reps - Long Sitting Isometric Ankle Eversion in Dorsiflexion with Ball at Wall  - 1 x daily - 7 x weekly - 2 sets -  10 reps - 5 sec hold   GOALS: Goals reviewed with patient? Yes  SHORT TERM GOALS: Target date: 10/24/2022  Pt will be ind with initial strength and balance HEP Baseline: Goal status: INITIAL  2.  Pt will be able to perform 5x STS without UE assist Baseline:  Goal status: INITIAL  3.  Pt will have improved DGI to >/=18/24 Baseline:  Goal status: INITIAL   LONG TERM GOALS: Target date: 11/21/2022  Pt will be ind with management and progression of HEP and continue community wellness Baseline:  Goal status: INITIAL  2.  Pt will be able to perform 5x STS in </=13 sec to demo decreased fall risk Baseline:  Goal status: INITIAL  3.  Pt will be able to demo improved DGI to >/=22/24 for decreased fall risk Baseline:  Goal status: INITIAL  4.  Pt will be able to amb grocery store (>1000') independently Baseline:  Goal status: INITIAL  5.  Pt will be able to demo at least 4/5 bilat LE strength per pt's personal goals Baseline:  Goal status: INITIAL   ASSESSMENT:  CLINICAL IMPRESSION: Phenicia continues to report overall fatigue. We discussed nutrition as she reports she has not been very good lately with her diet. She was not able to tolerate as much weight on the R today with drop set, but overall did well. Balance exercises continue to challenge her on the foam. She reports she is not using her cane in the community at all times (specifically at church). We discussed the importance of avoiding falls and PT encouraged compliance. STGs need to be assessed next visit.  EVAL: Patient is a 77 y.o. F who was seen today for physical therapy evaluation and treatment for new diagnosis of MS with gait instability. Pt notes history of TIA in 2022 which may  have been her initial MS exacerbation. Assessment significant for R LE weakness, decreased ROM, decreased balance with high fall risk, and decreased endurance affecting home and community mobility. Pt will benefit from PT to address these  deficits for continued safe participation in recreational activities.   OBJECTIVE IMPAIRMENTS: Abnormal gait, decreased activity tolerance, decreased balance, decreased endurance, decreased mobility, difficulty walking, decreased ROM, decreased strength, impaired UE functional use, and pain.    PLAN:  PT FREQUENCY: 1-2x/week (initially 2x/wk for 4 weeks with plans to wean to 1x/wk)  PT DURATION: 8 weeks  PLANNED INTERVENTIONS: Therapeutic exercises, Therapeutic activity, Neuromuscular re-education, Balance training, Gait training, Patient/Family education, Self Care, Joint mobilization, Stair training, Dry Needling, Electrical stimulation, Cryotherapy, Moist heat, Taping, Ionotophoresis 4mg /ml Dexamethasone, Manual therapy, and Re-evaluation  PLAN FOR NEXT SESSION: Check STGs Work on general LE strengthening and balance   Solon Palm, PT  10/29/2022, 4:38 PM

## 2022-10-29 ENCOUNTER — Ambulatory Visit: Payer: Medicare HMO | Admitting: Physical Therapy

## 2022-10-29 ENCOUNTER — Encounter: Payer: Self-pay | Admitting: Physical Therapy

## 2022-10-29 DIAGNOSIS — R262 Difficulty in walking, not elsewhere classified: Secondary | ICD-10-CM

## 2022-10-29 DIAGNOSIS — M6281 Muscle weakness (generalized): Secondary | ICD-10-CM

## 2022-10-29 DIAGNOSIS — R2681 Unsteadiness on feet: Secondary | ICD-10-CM

## 2022-10-31 ENCOUNTER — Other Ambulatory Visit: Payer: Self-pay

## 2022-10-31 ENCOUNTER — Ambulatory Visit: Payer: Medicare HMO

## 2022-10-31 DIAGNOSIS — R2681 Unsteadiness on feet: Secondary | ICD-10-CM

## 2022-10-31 DIAGNOSIS — R262 Difficulty in walking, not elsewhere classified: Secondary | ICD-10-CM

## 2022-10-31 DIAGNOSIS — M6281 Muscle weakness (generalized): Secondary | ICD-10-CM

## 2022-10-31 NOTE — Therapy (Addendum)
OUTPATIENT PHYSICAL THERAPY NEURO TREATMENT   Patient Name: Melinda Mckay MRN: [id number] DOB:February 15, 1946, 77 y.o., female Today's Date: 10/31/2022   PCP: Harvest [place], MD REFERRING PROVIDER:   Asa Lente, MD    END OF SESSION:  PT End of Session - 10/31/22 1248     Visit Number 9    Number of Visits 16    Date for PT Re-Evaluation 11/21/22    Authorization Type [employer]    Progress Note Due on Visit 10    PT Start Time 1145    PT Stop Time 1230    PT Time Calculation (min) 45 min                 Past Medical History:  Diagnosis Date   Anxiety    Asthma    Diabetes mellitus without complication (HCC)    Edema of lower extremity    Hyperlipidemia    Hypertension    Prediabetes    Restless legs    Past Surgical History:  Procedure Laterality Date   bone spur removal Right    BUNIONECTOMY Bilateral 2005   PARTIAL HYSTERECTOMY  2008   TUBAL LIGATION  1982   Patient Active Problem List   Diagnosis Date Noted   Cerebrovascular accident (CVA) due to occlusion of small artery (HCC) 10/30/2020   Numbness and tingling in right hand 10/30/2020   Acute stroke due to ischemia (HCC) 06/29/2020   Snoring 06/29/2020   Sleep apnea in adult 06/29/2020   Mild intermittent asthma 06/29/2020   Hypokalemia 04/24/2020   Lab test positive for detection of COVID-19 virus 04/24/2020   TIA (transient ischemic attack) 04/23/2020   Diverticulosis of colon 01/16/2018   Internal hemorrhoids 01/16/2018   Tubular adenoma 01/16/2018   Paresthesias 04/28/2017   Anxiety disorder 11/05/2016   Edema of lower extremity 11/05/2016   Essential hypertension 11/05/2016   Hyperlipidemia 11/05/2016   Prediabetes 11/05/2016   Restless legs 11/05/2016   Mild persistent asthma in adult without complication 12/12/2011    ONSET DATE: ~Feb 2022  REFERRING DIAG:  G37.9 (ICD-10-CM) - Demyelinating disease of the spinal cord (HCC)  R29.898 (ICD-10-CM) - Right leg  weakness  R26.9 (ICD-10-CM) - Gait disturbance    THERAPY DIAG:  Muscle weakness (generalized)  Difficulty in walking, not elsewhere classified  Unsteadiness on feet  Rationale for Evaluation and Treatment: Rehabilitation  SUBJECTIVE:                                                                                                                                                                                             SUBJECTIVE  STATEMENT: Patient states she is tired today which is usual for her. She exercised with her trainer this morning - they worked on some lower extremity, some upper extremity, and some balance today, but her legs are not fatigued to the point where she feels like she cannot exercise more today.  EVAL: Pt reports she saw [name] (Neurologist) on 09/09/22. Pt had MRI and was diagnosed with MS. Pt is to have a spinal tap to decide further management. Pt reports increased R UE and LE weakness since February 2022 when she initially thought she had a TIA. As time went on, pt reports increased complications on her R side. Pt states she is working with a Systems analyst and silver sneakers (2x/wk). Has started water aerobics in early July but has not been consistent due to vacation and schedule. Pt does note some aching bilat hip pain that can radiate when she's in bed but otherwise no other abnormal sensations or pain. She has a cane but does not use all the time and tries not to use regularly. Pt accompanied by: self  PERTINENT HISTORY: From Dr. [name] referral notes: 77 yo woman with probable MS and gait disorder and minimal right weakness   PAIN:  Are you having pain? None  PRECAUTIONS: None  RED FLAGS: None   WEIGHT BEARING RESTRICTIONS: No  FALLS: Has patient fallen in last 6 months? No  LIVING ENVIRONMENT: Lives with: lives alone; has daughters (live in [place]), 3 sisters in [place] Lives in: House/apartment Stairs: No Has upstairs but doesn't need  to go up there Has following equipment at home: Single point cane, 4 prong cane, walker  PLOF: Independent  PATIENT GOALS: Decrease use of cane, strengthen bilat LEs  OBJECTIVE:   DIAGNOSTIC FINDINGS: Recent MRI 09/02/22: IMPRESSION: Findings consistent with the clinical diagnosis of multiple sclerosis. Compared to prior MRI, there is interval progression of the T2 hyperintense lesions in the bilateral corona radiata and genu of the corpus callosum. No focus of abnormal contrast enhancement to suggest active demyelination.  SENSATION: R hand cold and numb  LOWER EXTREMITY ROM:     Active  Right Eval Left Eval  Hip flexion 85 >100  Hip extension    Hip abduction    Hip adduction    Hip internal rotation    Hip external rotation    Knee flexion    Knee extension    Ankle dorsiflexion    Ankle plantarflexion    Ankle inversion    Ankle eversion     (Blank rows = not tested)  LOWER EXTREMITY MMT:    MMT Right Eval Left Eval  Hip flexion 3- 4  Hip extension 3+ 3+  Hip abduction 2+ 3+  Hip adduction    Hip internal rotation    Hip external rotation    Knee flexion 3+ 5  Knee extension 3+ 5  Ankle dorsiflexion    Ankle plantarflexion    Ankle inversion    Ankle eversion    Grip strength 22# 40#  (Blank rows = not tested)  BED MOBILITY:  Independent  TRANSFERS: Assistive device utilized: None  Sit to stand: Complete Independence Stand to sit: Complete Independence  STAIRS: Level of Assistance: CGA Stair Negotiation Technique: Alternating Pattern  with Single Rail on Left Number of Stairs: 5 steps x 3  Height of Stairs: 4"-6"   Comments: Mild LOB when attempted no rail with descent  GAIT: Gait pattern: step through pattern, decreased step length- Right, decreased stance time- Right,  decreased ankle dorsiflexion- Right, Right foot flat, lateral lean- Right, and poor foot clearance- Right Increased R knee valgus during stance Distance walked: Multiple  laps in clinic during DGI Assistive device utilized: None Level of assistance: CGA  FUNCTIONAL TESTS:  5 times sit to stand: 19.5 sec (needed use of UEs) Dynamic Gait Index: 16/24    TODAY'S TREATMENT:   [facility] Adult PT Treatment:                                                DATE: 10/31/2022  Therapeutic Exercise: Standing: lateral hip hinge/squat (standing with hands on elevated table, placing one knee behind the other and sliding the knee down the back of the opposite leg), x 10 per side Standing: wide stance pelvic rotations + hip extension (rotating pelvis to one side, contracting contralateral glute toward front of contralateral hip), 5 x 10 sec holds per side Standing: closed-chain foot/ankle inversion (turning pelvis toward working side, maintaining ipsilateral 1st metatarsal head on floor), 10 x 10 sec hold per side Standing: staggered stance progressed tibia (lead leg) hip hinge, hand on table for balance, x 10 per side - pain on R Straddle sitting edge of table: pelvic rocker to low hip hinge (seated, rocking trunk/pelvis forward to lift from table), x 10 Neuromuscular re-ed: Standing: single leg stance on pool noodle, upper extremity support on table as needed, 10 x 10 sec holds  DATE: 10/29/2022  Therapeutic Exercise: Bike 2 min at L 3, then L1 x 4 min Supine short arc quad isometric hold drop sets, 12.5lbs/9.5lbs/5lbs/2lbs on L, 14lbs/9.5 lbs/5lbs/2lbs on R removing each weight at fatigue, Neuromuscular re-ed: Airex pad double limb balance, shoulder width stance, +/- eyes open, eyes closed, head turns R and L, head turns up and down Airex pad double limb balance, narrow stance, +/- eyes open, eyes closed Self Care: Discussion of energy level and diet. Patient looking into shake supplement.   [facility] Adult PT Treatment:                                                DATE: 10/17/2022  Therapeutic Exercise: Seated Fitter board hamstring curls, 1 band, 2x10 reps, performed R and  L Supine short arc quad isometric hold drop sets, 14lbs/9lbs/5lbs/2lbs, removing each weight at fatigue, performed R and L Neuromuscular re-ed: Airex pad double limb balance, shoulder width stance, +/- eyes open, eyes closed, head turns R and L, head turns up and down Airex pad double limb balance, narrow stance, +/- eyes open, eyes closed Trialed assisted foam roller step ups - discontinue due to being too advanced BAPS board double limb balance, level 2 ball, upper extremity assist as needed BAPS board double limb balance with clinician perturbations on board, level 1 ball, upper extremity assist as needed  [facility] Adult PT Treatment:                                                DATE: 10/15/22 Therapeutic Exercise: Recumbent bike L3 x 3 min Gastroc stretch against wall x30 sec Leg press 65# double leg 3x10 (red band  around knees) Pball roll knee flex/ext 2x10 (working on control) Knee bent foot on pball, hip IR/ER 2x10 Foot on pball SLR off it 2x10 Sidelying clamshell 2x10 Prone hip ext knee flexed 2x10    [facility] Adult PT Treatment:                                                DATE: 10/10/2022 Therapeutic Exercise: Recumbent bike L3 x 3 min STS no HHA, RTB above knees (R hip abd iso) 3x10 Prone knee flexion (R) 2x8 Leading R leg:  Front step up/down 8" stepper 2x10 Lateral step up/down (straddling step) 8" stepper 2x10 Walking stepping over high blocks R leg step over high blocks fwd/bkwd  Self Care: MS online resources Slow marching next to wall for HEP to challenge balance and strength   [facility] Adult PT Treatment:                                                DATE: 10/07/2022 Therapeutic Exercise: Recumbent bike L2 x 3 min Seated edge of table: hip flexor stretch Quadruped: Fwd/bkwd rocking x10 Leg extension (toe on ground) x5 each leg Hydrants x10 (B) Leg extension kick out x10 (B) Seated LAQ + ball squeeze 10x3" (B) Long sitting isometric ankle DF/ever with ball against  wall 5x5" Prone knee flexion (R) --> min A --> active assist with strap  PATIENT EDUCATION: Education details: Updated HEP Person educated: Patient Education method: Explanation, Demonstration, and Handouts Education comprehension: verbalized understanding, returned demonstration, and needs further education  HOME EXERCISE PROGRAM: Access Code: [email or web address] URL: [email or web address] Date: 10/03/2022 Prepared by: Carlynn Herald  Exercises - Small Range Straight Leg Raise  - 1 x daily - 7 x weekly - 1 sets - 10 reps - Prone Knee Flexion  - 1 x daily - 7 x weekly - 1 sets - 10 reps - Supine Bridge with Mini Swiss Ball Between Knees  - 1 x daily - 7 x weekly - 3 sets - 10 reps - Clam with Resistance  - 1 x daily - 7 x weekly - 3 sets - 10 reps - Prone Heel Squeeze  - 1 x daily - 7 x weekly - 3 sets - 10 reps - Side Stepping with Resistance at Ankles and Counter Support  - 1 x daily - 7 x weekly - 3 sets - 10 reps - Long Sitting Isometric Ankle Eversion in Dorsiflexion with Ball at Wall  - 1 x daily - 7 x weekly - 2 sets - 10 reps - 5 sec hold   GOALS: Goals reviewed with patient? Yes  SHORT TERM GOALS: Target date: 10/24/2022  Pt will be ind with initial strength and balance HEP Baseline: Goal status: Met  2.  Pt will be able to perform 5x STS without UE assist Baseline:  Goal status: Not met - still requires use of hands to stand from chair  3.  Pt will have improved DGI to >/=18/24 Baseline:  Goal status: INITIAL   LONG TERM GOALS: Target date: 11/21/2022  Pt will be ind with management and progression of HEP and continue community wellness Baseline:  Goal status: INITIAL  2.  Pt will be able to perform 5x STS in </=13  sec to demo decreased fall risk Baseline:  Goal status: INITIAL  3.  Pt will be able to demo improved DGI to >/=22/24 for decreased fall risk Baseline:  Goal status: INITIAL  4.  Pt will be able to amb grocery store (>1000')  independently Baseline:  Goal status: INITIAL  5.  Pt will be able to demo at least 4/5 bilat LE strength per pt's personal goals Baseline:  Goal status: INITIAL   ASSESSMENT:  CLINICAL IMPRESSION: Patient tolerated treatment well with the exception of the progressed tibia hip hinge on the R due to knee pain. Balance continues to be difficult when challenged without upper extremity support. Regarding goals, patient is independently working on home program but is not yet able to stand from a standard chair without upper extremity support. In error, time was not budgeted for the Dynamic Gait Index today but will complete next week. Though the sit-to-stand goal was not yet met, physical therapy remains indicated as further improvement in strength and balance is still expected.   EVAL: Patient is a 77 y.o. F who was seen today for physical therapy evaluation and treatment for new diagnosis of MS with gait instability. Pt notes history of TIA in 2022 which may have been her initial MS exacerbation. Assessment significant for R LE weakness, decreased ROM, decreased balance with high fall risk, and decreased endurance affecting home and community mobility. Pt will benefit from PT to address these deficits for continued safe participation in recreational activities.   OBJECTIVE IMPAIRMENTS: Abnormal gait, decreased activity tolerance, decreased balance, decreased endurance, decreased mobility, difficulty walking, decreased ROM, decreased strength, impaired UE functional use, and pain.    PLAN:  PT FREQUENCY: 1-2x/week (initially 2x/wk for 4 weeks with plans to wean to 1x/wk)  PT DURATION: 8 weeks  PLANNED INTERVENTIONS: Therapeutic exercises, Therapeutic activity, Neuromuscular re-education, Balance training, Gait training, Patient/Family education, Self Care, Joint mobilization, Stair training, Dry Needling, Electrical stimulation, Cryotherapy, Moist heat, Taping, Ionotophoresis 4mg /ml  Dexamethasone, Manual therapy, and Re-evaluation  PLAN FOR NEXT SESSION: Check STGs Work on general LE strengthening and balance   Edmonia Caprio, PT, PhD, DPT  10/31/2022, 4:37 PM

## 2022-11-05 ENCOUNTER — Ambulatory Visit: Payer: Medicare HMO | Admitting: Physical Therapy

## 2022-11-06 NOTE — Therapy (Signed)
OUTPATIENT PHYSICAL THERAPY NEURO TREATMENT   Patient Name: Melinda Mckay MRN: [id number] DOB:1945-10-04, 77 y.o., female Today's Date: 11/06/2022   PCP: [place], MD REFERRING PROVIDER:   Asa Lente, MD    END OF SESSION:        Past Medical History:  Diagnosis Date   Anxiety    Asthma    Diabetes mellitus without complication (HCC)    Edema of lower extremity    Hyperlipidemia    Hypertension    Prediabetes    Restless legs    Past Surgical History:  Procedure Laterality Date   bone spur removal Right    BUNIONECTOMY Bilateral 2005   PARTIAL HYSTERECTOMY  2008   TUBAL LIGATION  1982   Patient Active Problem List   Diagnosis Date Noted   Cerebrovascular accident (CVA) due to occlusion of small artery (HCC) 10/30/2020   Numbness and tingling in right hand 10/30/2020   Acute stroke due to ischemia (HCC) 06/29/2020   Snoring 06/29/2020   Sleep apnea in adult 06/29/2020   Mild intermittent asthma 06/29/2020   Hypokalemia 04/24/2020   Lab test positive for detection of COVID-19 virus 04/24/2020   TIA (transient ischemic attack) 04/23/2020   Diverticulosis of colon 01/16/2018   Internal hemorrhoids 01/16/2018   Tubular adenoma 01/16/2018   Paresthesias 04/28/2017   Anxiety disorder 11/05/2016   Edema of lower extremity 11/05/2016   Essential hypertension 11/05/2016   Hyperlipidemia 11/05/2016   Prediabetes 11/05/2016   Restless legs 11/05/2016   Mild persistent asthma in adult without complication 12/12/2011    ONSET DATE: ~Feb 2022  REFERRING DIAG:  G37.9 (ICD-10-CM) - Demyelinating disease of the spinal cord (HCC)  R29.898 (ICD-10-CM) - Right leg weakness  R26.9 (ICD-10-CM) - Gait disturbance    THERAPY DIAG:  No diagnosis found.  Rationale for Evaluation and Treatment: Rehabilitation  SUBJECTIVE:                                                                                                                                                                                              SUBJECTIVE STATEMENT: ***  EVAL: Pt reports she saw Dr. Epimenio Mckay (Neurologist) on 09/09/22. Pt had MRI and was diagnosed with MS. Pt is to have a spinal tap to decide further management. Pt reports increased R UE and LE weakness since February 2022 when she initially thought she had a TIA. As time went on, pt reports increased complications on her R side. Pt states she is working with a Systems analyst and silver sneakers (2x/wk). Has started water aerobics in early July but has not been consistent due  to vacation and schedule. Pt does note some aching bilat hip pain that can radiate when she's in bed but otherwise no other abnormal sensations or pain. She has a cane but does not use all the time and tries not to use regularly. Pt accompanied by: self  PERTINENT HISTORY: From Dr. [name] referral notes: 77 yo woman with probable MS and gait disorder and minimal right weakness   PAIN:  Are you having pain? None  PRECAUTIONS: None  RED FLAGS: None   WEIGHT BEARING RESTRICTIONS: No  FALLS: Has patient fallen in last 6 months? No  LIVING ENVIRONMENT: Lives with: lives alone; has daughters (live in [place]), 3 sisters in [place] Lives in: House/apartment Stairs: No Has upstairs but doesn't need to go up there Has following equipment at home: Single point cane, 4 prong cane, walker  PLOF: Independent  PATIENT GOALS: Decrease use of cane, strengthen bilat LEs  OBJECTIVE:   DIAGNOSTIC FINDINGS: Recent MRI 09/02/22: IMPRESSION: Findings consistent with the clinical diagnosis of multiple sclerosis. Compared to prior MRI, there is interval progression of the T2 hyperintense lesions in the bilateral corona radiata and genu of the corpus callosum. No focus of abnormal contrast enhancement to suggest active demyelination.  SENSATION: R hand cold and numb  LOWER EXTREMITY ROM:     Active  Right Eval Left Eval  Hip flexion 85  >100  Hip extension    Hip abduction    Hip adduction    Hip internal rotation    Hip external rotation    Knee flexion    Knee extension    Ankle dorsiflexion    Ankle plantarflexion    Ankle inversion    Ankle eversion     (Blank rows = not tested)  LOWER EXTREMITY MMT:    MMT Right Eval Left Eval  Hip flexion 3- 4  Hip extension 3+ 3+  Hip abduction 2+ 3+  Hip adduction    Hip internal rotation    Hip external rotation    Knee flexion 3+ 5  Knee extension 3+ 5  Ankle dorsiflexion    Ankle plantarflexion    Ankle inversion    Ankle eversion    Grip strength 22# 40#  (Blank rows = not tested)  BED MOBILITY:  Independent  TRANSFERS: Assistive device utilized: None  Sit to stand: Complete Independence Stand to sit: Complete Independence  STAIRS: Level of Assistance: CGA Stair Negotiation Technique: Alternating Pattern  with Single Rail on Left Number of Stairs: 5 steps x 3  Height of Stairs: 4"-6"   Comments: Mild LOB when attempted no rail with descent  GAIT: Gait pattern: step through pattern, decreased step length- Right, decreased stance time- Right, decreased ankle dorsiflexion- Right, Right Mckay flat, lateral lean- Right, and poor Mckay clearance- Right Increased R knee valgus during stance Distance walked: Multiple laps in clinic during DGI Assistive device utilized: None Level of assistance: CGA  FUNCTIONAL TESTS:  5 times sit to stand: 19.5 sec (needed use of UEs) Dynamic Gait Index: 16/24    TODAY'S TREATMENT:   [facility] Adult PT Treatment:                                                DATE: 11/07/2022  Therapeutic Activities: DGI  Therapeutic Exercise: Standing: lateral hip hinge/squat (standing with hands on elevated table, placing one knee behind the  other and sliding the knee down the back of the opposite leg), x 10 per side Standing: wide stance pelvic rotations + hip extension (rotating pelvis to one side, contracting contralateral glute  toward front of contralateral hip), 5 x 10 sec holds per side Standing: closed-chain Mckay/ankle inversion (turning pelvis toward working side, maintaining ipsilateral 1st metatarsal head on floor), 10 x 10 sec hold per side Standing: staggered stance progressed tibia (lead leg) hip hinge, hand on table for balance, x 10 per side - pain on R Straddle sitting edge of table: pelvic rocker to low hip hinge (seated, rocking trunk/pelvis forward to lift from table), x 10 Neuromuscular re-ed: Standing: single leg stance on pool noodle, upper extremity support on table as needed, 10 x 10 sec holds  [facility] Adult PT Treatment:                                                DATE: 10/31/2022  Therapeutic Exercise: Standing: lateral hip hinge/squat (standing with hands on elevated table, placing one knee behind the other and sliding the knee down the back of the opposite leg), x 10 per side Standing: wide stance pelvic rotations + hip extension (rotating pelvis to one side, contracting contralateral glute toward front of contralateral hip), 5 x 10 sec holds per side Standing: closed-chain Mckay/ankle inversion (turning pelvis toward working side, maintaining ipsilateral 1st metatarsal head on floor), 10 x 10 sec hold per side Standing: staggered stance progressed tibia (lead leg) hip hinge, hand on table for balance, x 10 per side - pain on R Straddle sitting edge of table: pelvic rocker to low hip hinge (seated, rocking trunk/pelvis forward to lift from table), x 10 Neuromuscular re-ed: Standing: single leg stance on pool noodle, upper extremity support on table as needed, 10 x 10 sec holds  DATE: 10/29/2022  Therapeutic Exercise: Bike 2 min at L 3, then L1 x 4 min Supine short arc quad isometric hold drop sets, 12.5lbs/9.5lbs/5lbs/2lbs on L, 14lbs/9.5 lbs/5lbs/2lbs on R removing each weight at fatigue, Neuromuscular re-ed: Airex pad double limb balance, shoulder width stance, +/- eyes open, eyes closed, head  turns R and L, head turns up and down Airex pad double limb balance, narrow stance, +/- eyes open, eyes closed Self Care: Discussion of energy level and diet. Patient looking into shake supplement.   [facility] Adult PT Treatment:                                                DATE: 10/17/2022  Therapeutic Exercise: Seated Fitter board hamstring curls, 1 band, 2x10 reps, performed R and L Supine short arc quad isometric hold drop sets, 14lbs/9lbs/5lbs/2lbs, removing each weight at fatigue, performed R and L Neuromuscular re-ed: Airex pad double limb balance, shoulder width stance, +/- eyes open, eyes closed, head turns R and L, head turns up and down Airex pad double limb balance, narrow stance, +/- eyes open, eyes closed Trialed assisted foam roller step ups - discontinue due to being too advanced BAPS board double limb balance, level 2 ball, upper extremity assist as needed BAPS board double limb balance with clinician perturbations on board, level 1 ball, upper extremity assist as needed  [facility] Adult PT Treatment:  DATE: 10/15/22 Therapeutic Exercise: Recumbent bike L3 x 3 min Gastroc stretch against wall x30 sec Leg press 65# double leg 3x10 (red band around knees) Pball roll knee flex/ext 2x10 (working on control) Knee bent Mckay on pball, hip IR/ER 2x10 Mckay on pball SLR off it 2x10 Sidelying clamshell 2x10 Prone hip ext knee flexed 2x10    [facility] Adult PT Treatment:                                                DATE: 10/10/2022 Therapeutic Exercise: Recumbent bike L3 x 3 min STS no HHA, RTB above knees (R hip abd iso) 3x10 Prone knee flexion (R) 2x8 Leading R leg:  Front step up/down 8" stepper 2x10 Lateral step up/down (straddling step) 8" stepper 2x10 Walking stepping over high blocks R leg step over high blocks fwd/bkwd  Self Care: MS online resources Slow marching next to wall for HEP to challenge balance and strength   [facility] Adult  PT Treatment:                                                DATE: 10/07/2022 Therapeutic Exercise: Recumbent bike L2 x 3 min Seated edge of table: hip flexor stretch Quadruped: Fwd/bkwd rocking x10 Leg extension (toe on ground) x5 each leg Hydrants x10 (B) Leg extension kick out x10 (B) Seated LAQ + ball squeeze 10x3" (B) Long sitting isometric ankle DF/ever with ball against wall 5x5" Prone knee flexion (R) --> min A --> active assist with strap  PATIENT EDUCATION: Education details: Updated HEP Person educated: Patient Education method: Explanation, Demonstration, and Handouts Education comprehension: verbalized understanding, returned demonstration, and needs further education  HOME EXERCISE PROGRAM: Access Code: [email or web address] URL: [email or web address] Date: 10/03/2022 Prepared by: Carlynn Herald  Exercises - Small Range Straight Leg Raise  - 1 x daily - 7 x weekly - 1 sets - 10 reps - Prone Knee Flexion  - 1 x daily - 7 x weekly - 1 sets - 10 reps - Supine Bridge with Mini Swiss Ball Between Knees  - 1 x daily - 7 x weekly - 3 sets - 10 reps - Clam with Resistance  - 1 x daily - 7 x weekly - 3 sets - 10 reps - Prone Heel Squeeze  - 1 x daily - 7 x weekly - 3 sets - 10 reps - Side Stepping with Resistance at Ankles and Counter Support  - 1 x daily - 7 x weekly - 3 sets - 10 reps - Long Sitting Isometric Ankle Eversion in Dorsiflexion with Ball at Wall  - 1 x daily - 7 x weekly - 2 sets - 10 reps - 5 sec hold   GOALS: Goals reviewed with patient? Yes  SHORT TERM GOALS: Target date: 10/24/2022  Pt will be ind with initial strength and balance HEP Baseline: Goal status: Met  2.  Pt will be able to perform 5x STS without UE assist Baseline:  Goal status: Not met - still requires use of hands to stand from chair  3.  Pt will have improved DGI to >/=18/24 Baseline:  Goal status: INITIAL   LONG TERM GOALS: Target date: 11/21/2022  Pt will be ind with  management and  progression of HEP and continue community wellness Baseline:  Goal status: INITIAL  2.  Pt will be able to perform 5x STS in </=13 sec to demo decreased fall risk Baseline:  Goal status: INITIAL  3.  Pt will be able to demo improved DGI to >/=22/24 for decreased fall risk Baseline:  Goal status: INITIAL  4.  Pt will be able to amb grocery store (>1000') independently Baseline:  Goal status: INITIAL  5.  Pt will be able to demo at least 4/5 bilat LE strength per pt's personal goals Baseline:  Goal status: INITIAL   ASSESSMENT:  CLINICAL IMPRESSION: ***   EVAL: Patient is a 77 y.o. F who was seen today for physical therapy evaluation and treatment for new diagnosis of MS with gait instability. Pt notes history of TIA in 2022 which may have been her initial MS exacerbation. Assessment significant for R LE weakness, decreased ROM, decreased balance with high fall risk, and decreased endurance affecting home and community mobility. Pt will benefit from PT to address these deficits for continued safe participation in recreational activities.   OBJECTIVE IMPAIRMENTS: Abnormal gait, decreased activity tolerance, decreased balance, decreased endurance, decreased mobility, difficulty walking, decreased ROM, decreased strength, impaired UE functional use, and pain.    PLAN:  PT FREQUENCY: 1-2x/week (initially 2x/wk for 4 weeks with plans to wean to 1x/wk)  PT DURATION: 8 weeks  PLANNED INTERVENTIONS: Therapeutic exercises, Therapeutic activity, Neuromuscular re-education, Balance training, Gait training, Patient/Family education, Self Care, Joint mobilization, Stair training, Dry Needling, Electrical stimulation, Cryotherapy, Moist heat, Taping, Ionotophoresis 4mg /ml Dexamethasone, Manual therapy, and Re-evaluation  PLAN FOR NEXT SESSION: Check STGs Work on general LE strengthening and balance   Solon Palm, PT  11/06/2022, 4:54 PM

## 2022-11-07 ENCOUNTER — Encounter: Payer: Self-pay | Admitting: Physical Therapy

## 2022-11-07 ENCOUNTER — Ambulatory Visit: Payer: Medicare HMO | Admitting: Physical Therapy

## 2022-11-07 DIAGNOSIS — R262 Difficulty in walking, not elsewhere classified: Secondary | ICD-10-CM

## 2022-11-07 DIAGNOSIS — M6281 Muscle weakness (generalized): Secondary | ICD-10-CM

## 2022-11-07 DIAGNOSIS — R2681 Unsteadiness on feet: Secondary | ICD-10-CM

## 2022-11-12 ENCOUNTER — Ambulatory Visit: Payer: Medicare HMO | Attending: Neurology | Admitting: Rehabilitative and Restorative Service Providers"

## 2022-11-12 ENCOUNTER — Encounter: Payer: Self-pay | Admitting: Rehabilitative and Restorative Service Providers"

## 2022-11-12 DIAGNOSIS — R262 Difficulty in walking, not elsewhere classified: Secondary | ICD-10-CM | POA: Diagnosis present

## 2022-11-12 DIAGNOSIS — M6281 Muscle weakness (generalized): Secondary | ICD-10-CM | POA: Diagnosis present

## 2022-11-12 DIAGNOSIS — R2681 Unsteadiness on feet: Secondary | ICD-10-CM | POA: Insufficient documentation

## 2022-11-12 NOTE — Therapy (Addendum)
 OUTPATIENT PHYSICAL THERAPY NEURO TREATMENT   Patient Name: Melinda Mckay MRN: [id number] DOB:01/04/1946, 77 y.o., female Today's Date: 11/12/2022   PCP: Har[place], MD REFERRING PROVIDER:   Asa Lente, MD    END OF SESSION:  PT End of Session - 11/12/22 1059     Visit Number 11    Number of Visits 16    Date for PT Re-Evaluation 11/21/22    Authorization Type [employer]    Progress Note Due on Visit 20    PT Start Time 1058    PT Stop Time 1143    PT Time Calculation (min) 45 min    Activity Tolerance Patient tolerated treatment well                  Past Medical History:  Diagnosis Date   Anxiety    Asthma    Diabetes mellitus without complication (HCC)    Edema of lower extremity    Hyperlipidemia    Hypertension    Prediabetes    Restless legs    Past Surgical History:  Procedure Laterality Date   bone spur removal Right    BUNIONECTOMY Bilateral 2005   PARTIAL HYSTERECTOMY  2008   TUBAL LIGATION  1982   Patient Active Problem List   Diagnosis Date Noted   Cerebrovascular accident (CVA) due to occlusion of small artery (HCC) 10/30/2020   Numbness and tingling in right hand 10/30/2020   Acute stroke due to ischemia (HCC) 06/29/2020   Snoring 06/29/2020   Sleep apnea in adult 06/29/2020   Mild intermittent asthma 06/29/2020   Hypokalemia 04/24/2020   Lab test positive for detection of COVID-19 virus 04/24/2020   TIA (transient ischemic attack) 04/23/2020   Diverticulosis of colon 01/16/2018   Internal hemorrhoids 01/16/2018   Tubular adenoma 01/16/2018   Paresthesias 04/28/2017   Anxiety disorder 11/05/2016   Edema of lower extremity 11/05/2016   Essential hypertension 11/05/2016   Hyperlipidemia 11/05/2016   Prediabetes 11/05/2016   Restless legs 11/05/2016   Mild persistent asthma in adult without complication 12/12/2011    ONSET DATE: ~Feb 2022  REFERRING DIAG:  G37.9 (ICD-10-CM) - Demyelinating disease of  the spinal cord (HCC)  R29.898 (ICD-10-CM) - Right leg weakness  R26.9 (ICD-10-CM) - Gait disturbance    THERAPY DIAG:  Muscle weakness (generalized)  Difficulty in walking, not elsewhere classified  Unsteadiness on feet  Rationale for Evaluation and Treatment: Rehabilitation  SUBJECTIVE:  SUBJECTIVE STATEMENT: Rain makes her whole body sore and tight.   EVAL: Pt reports she saw [name] (Neurologist) on 09/09/22. Pt had MRI and was diagnosed with MS. Pt is to have a spinal tap to decide further management. Pt reports increased R UE and LE weakness since February 2022 when she initially thought she had a TIA. As time went on, pt reports increased complications on her R side. Pt states she is working with a Systems analyst and silver sneakers (2x/wk). Has started water aerobics in early July but has not been consistent due to vacation and schedule. Pt does note some aching bilat hip pain that can radiate when she's in bed but otherwise no other abnormal sensations or pain. She has a cane but does not use all the time and tries not to use regularly. Pt accompanied by: self  PERTINENT HISTORY: From Dr. [name] referral notes: 77 yo woman with probable MS and gait disorder and minimal right weakness   PAIN:  Are you having pain? None  PRECAUTIONS: None  RED FLAGS: None   WEIGHT BEARING RESTRICTIONS: No  FALLS: Has patient fallen in last 6 months? No  LIVING ENVIRONMENT: Lives with: lives alone; has daughters (live in [place]), 3 sisters in [place] Lives in: House/apartment Stairs: No Has upstairs but doesn't need to go up there Has following equipment at home: Single point cane, 4 prong cane, walker  PLOF: Independent  PATIENT GOALS: Decrease use of cane, strengthen bilat  LEs  OBJECTIVE:   DIAGNOSTIC FINDINGS: Recent MRI 09/02/22: IMPRESSION: Findings consistent with the clinical diagnosis of multiple sclerosis. Compared to prior MRI, there is interval progression of the T2 hyperintense lesions in the bilateral corona radiata and genu of the corpus callosum. No focus of abnormal contrast enhancement to suggest active demyelination.  SENSATION: R hand cold and numb  LOWER EXTREMITY ROM:     Active  Right Eval Left Eval  Hip flexion 85 >100  Hip extension    Hip abduction    Hip adduction    Hip internal rotation    Hip external rotation    Knee flexion    Knee extension    Ankle dorsiflexion    Ankle plantarflexion    Ankle inversion    Ankle eversion     (Blank rows = not tested)  LOWER EXTREMITY MMT:    MMT Right Eval Left Eval Right  8/29 Left 8/29  Hip flexion 3- 4 3 4   Hip extension 3+ 3+ 5 4+  Hip abduction 2+ 3+ 2+ 5  Hip adduction      Hip internal rotation      Hip external rotation      Knee flexion 3+ 5 3 5   Knee extension 3+ 5 4+ 5  Ankle dorsiflexion      Ankle plantarflexion      Ankle inversion      Ankle eversion      Grip strength 22# 40#    (Blank rows = not tested)  BED MOBILITY:  Independent  TRANSFERS: Assistive device utilized: None  Sit to stand: Complete Independence Stand to sit: Complete Independence  STAIRS: Level of Assistance: CGA Stair Negotiation Technique: Alternating Pattern  with Single Rail on Left Number of Stairs: 5 steps x 3  Height of Stairs: 4"-6"   Comments: Mild LOB when attempted no rail with descent  GAIT: Gait pattern: step through pattern, decreased step length- Right, decreased stance time- Right, decreased ankle dorsiflexion- Right, Right foot flat, lateral lean-  Right, and poor foot clearance- Right Increased R knee valgus during stance Distance walked: Multiple laps in clinic during DGI Assistive device utilized: None Level of assistance: CGA  FUNCTIONAL TESTS:   5 times sit to stand: 19.5 sec (needed use of UEs) Dynamic Gait Index: 16/24   11/07/22 18/24    [facility] Adult PT Treatment:                                                DATE: 11/12/22 Therapeutic Exercise: Nustep L2 x 5  Standing  Mini wall squat 5 sec x 6  Hip extension back to wall (glut set) 5 sec x 10 Heel raises UE support on treadmill rail x 10 x 2  SLS with UE support 10 sec x 5 R/L  Hip extension UE support 10 x 2 R/L  Hip abduction leading with heel to recruit glut med UE support 10 x 2 R/L  Stepping fwd practice wt shift standing on R/L x 10 each side  Sitting Sit to stand from elevated surfaces use of UE's as needed  x 5 x 2  Ankle DF green TB x 10 x 2 R/L   Neuromuscular re-ed: Working on standing without locking knees   Gait:    [facility] Adult PT Treatment:                                                DATE: 11/07/2022  Therapeutic Activities: DGI, MMT, goals assessed  Therapeutic Exercise: Prone HS curl R 2x 5 difficult an poor eccentric control Prone hip ext x 10 B Supine hip ABD R x 5 (can't do without ER hip) Standing side step with Red band 4x4 large steps Standing marching red band at ankles x 10 ea Seated marching B no band x 10 ea to get full range  Seated HS curl on slider R x 10 and with Yellow band x 5 (for HEP)    [facility] Adult PT Treatment:                                                DATE: 10/31/2022  Therapeutic Exercise: Standing: lateral hip hinge/squat (standing with hands on elevated table, placing one knee behind the other and sliding the knee down the back of the opposite leg), x 10 per side Standing: wide stance pelvic rotations + hip extension (rotating pelvis to one side, contracting contralateral glute toward front of contralateral hip), 5 x 10 sec holds per side Standing: closed-chain foot/ankle inversion (turning pelvis toward working side, maintaining ipsilateral 1st metatarsal head on floor), 10 x 10 sec hold per side Standing:  staggered stance progressed tibia (lead leg) hip hinge, hand on table for balance, x 10 per side - pain on R Straddle sitting edge of table: pelvic rocker to low hip hinge (seated, rocking trunk/pelvis forward to lift from table), x 10 Neuromuscular re-ed: Standing: single leg stance on pool noodle, upper extremity support on table as needed, 10 x 10 sec holds  DATE: 10/29/2022  Therapeutic Exercise: Bike 2 min at L 3, then L1 x 4 min  Supine short arc quad isometric hold drop sets, 12.5lbs/9.5lbs/5lbs/2lbs on L, 14lbs/9.5 lbs/5lbs/2lbs on R removing each weight at fatigue, Neuromuscular re-ed: Airex pad double limb balance, shoulder width stance, +/- eyes open, eyes closed, head turns R and L, head turns up and down Airex pad double limb balance, narrow stance, +/- eyes open, eyes closed Self Care: Discussion of energy level and diet. Patient looking into shake supplement.   [facility] Adult PT Treatment:                                                DATE: 10/17/2022  Therapeutic Exercise: Seated Fitter board hamstring curls, 1 band, 2x10 reps, performed R and L Supine short arc quad isometric hold drop sets, 14lbs/9lbs/5lbs/2lbs, removing each weight at fatigue, performed R and L Neuromuscular re-ed: Airex pad double limb balance, shoulder width stance, +/- eyes open, eyes closed, head turns R and L, head turns up and down Airex pad double limb balance, narrow stance, +/- eyes open, eyes closed Trialed assisted foam roller step ups - discontinue due to being too advanced BAPS board double limb balance, level 2 ball, upper extremity assist as needed BAPS board double limb balance with clinician perturbations on board, level 1 ball, upper extremity assist as needed  [facility] Adult PT Treatment:                                                DATE: 10/15/22 Therapeutic Exercise: Recumbent bike L3 x 3 min Gastroc stretch against wall x30 sec Leg press 65# double leg 3x10 (red band around knees) Pball  roll knee flex/ext 2x10 (working on control) Knee bent foot on pball, hip IR/ER 2x10 Foot on pball SLR off it 2x10 Sidelying clamshell 2x10 Prone hip ext knee flexed 2x10   PATIENT EDUCATION: Education details: Updated HEP Person educated: Patient Education method: Explanation, Demonstration, and Handouts Education comprehension: verbalized understanding, returned demonstration, and needs further education  HOME EXERCISE PROGRAM: Access Code: [email or web address] URL: [email or web address] Date: 11/07/2022 Prepared by: Raynelle Fanning  Exercises - Small Range Straight Leg Raise  - 1 x daily - 7 x weekly - 1 sets - 10 reps - Prone Knee Flexion  - 1 x daily - 7 x weekly - 1 sets - 10 reps - Supine Bridge with Mini Swiss Ball Between Knees  - 1 x daily - 7 x weekly - 3 sets - 10 reps - Clam with Resistance  - 1 x daily - 7 x weekly - 3 sets - 10 reps - Prone Heel Squeeze  - 1 x daily - 7 x weekly - 3 sets - 10 reps - Side Stepping with Resistance at Ankles and Counter Support  - 1 x daily - 7 x weekly - 3 sets - 10 reps - Long Sitting Isometric Ankle Eversion in Dorsiflexion with Ball at Wall  - 1 x daily - 7 x weekly - 2 sets - 10 reps - 5 sec hold - Standing March  - 1 x daily - 7 x weekly - 3 sets - 10 reps - Prone Hip Extension with Bent Knee  - 1 x daily - 7 x weekly - 2 sets - 10 reps - Seated Hamstring Curl with Anchored Resistance  -  1 x daily - 3-4 x weekly - 2 sets - 10 reps   GOALS: Goals reviewed with patient? Yes  SHORT TERM GOALS: Target date: 10/24/2022  Pt will be ind with initial strength and balance HEP Baseline: Goal status: Met  2.  Pt will be able to perform 5x STS without UE assist Baseline:  Goal status: Not met - still requires use of hands to stand from chair  3.  Pt will have improved DGI to >/=18/24 Baseline:  Goal status: MET 11/07/22   LONG TERM GOALS: Target date: 11/21/2022  Pt will be ind with management and progression of HEP and continue  community wellness Baseline:  Goal status: INITIAL  2.  Pt will be able to perform 5x STS in </=13 sec to demo decreased fall risk Baseline:  Goal status: INITIAL  3.  Pt will be able to demo improved DGI to >/=22/24 for decreased fall risk Baseline:  Goal status: INITIAL  4.  Pt will be able to amb grocery store (>1000') independently Baseline:  Goal status: INITIAL  5.  Pt will be able to demo at least 4/5 bilat LE strength per pt's personal goals Baseline:  Goal status: IN PROGRESS (see objective chart)   ASSESSMENT:  CLINICAL IMPRESSION: Quincey continued work on LE strengthening and gait. Focus on exercises in standing to use body weight as resistance for strengthening.    EVAL: Patient is a 77 y.o. F who was seen today for physical therapy evaluation and treatment for new diagnosis of MS with gait instability. Pt notes history of TIA in 2022 which may have been her initial MS exacerbation. Assessment significant for R LE weakness, decreased ROM, decreased balance with high fall risk, and decreased endurance affecting home and community mobility. Pt will benefit from PT to address these deficits for continued safe participation in recreational activities.   OBJECTIVE IMPAIRMENTS: Abnormal gait, decreased activity tolerance, decreased balance, decreased endurance, decreased mobility, difficulty walking, decreased ROM, decreased strength, impaired UE functional use, and pain.    PLAN:  PT FREQUENCY: 1-2x/week (initially 2x/wk for 4 weeks with plans to wean to 1x/wk)  PT DURATION: 8 weeks  PLANNED INTERVENTIONS: Therapeutic exercises, Therapeutic activity, Neuromuscular re-education, Balance training, Gait training, Patient/Family education, Self Care, Joint mobilization, Stair training, Dry Needling, Electrical stimulation, Cryotherapy, Moist heat, Taping, Ionotophoresis 4mg /ml Dexamethasone, Manual therapy, and Re-evaluation  PLAN FOR NEXT SESSION: Work on general LE  strengthening and balance   Avyn Aden P. Leonor Liv PT, MPH 11/12/22 11:45 AM

## 2022-11-13 NOTE — Therapy (Addendum)
 OUTPATIENT PHYSICAL THERAPY NEURO TREATMENT AND DISCHARGE SUMMARY    Patient Name: Melinda Mckay MRN: [id number] DOB:Mar 08, 1946, 77 y.o., female Today's Date: 11/14/2022   PCP: Nohemi Batters, MD REFERRING PROVIDER:   Jorie Newness, MD    END OF SESSION:  PT End of Session - 11/14/22 1400     Visit Number 12    Number of Visits 16    Date for PT Re-Evaluation 11/21/22    Authorization Type [employer]    Progress Note Due on Visit 20    PT Start Time 1400    PT Stop Time 1444    PT Time Calculation (min) 44 min    Activity Tolerance Patient tolerated treatment well    Behavior During Therapy WFL for tasks assessed/performed                   Past Medical History:  Diagnosis Date   Anxiety    Asthma    Diabetes mellitus without complication (HCC)    Edema of lower extremity    Hyperlipidemia    Hypertension    Prediabetes    Restless legs    Past Surgical History:  Procedure Laterality Date   bone spur removal Right    BUNIONECTOMY Bilateral 2005   PARTIAL HYSTERECTOMY  2008   TUBAL LIGATION  1982   Patient Active Problem List   Diagnosis Date Noted   Cerebrovascular accident (CVA) due to occlusion of small artery (HCC) 10/30/2020   Numbness and tingling in right hand 10/30/2020   Acute stroke due to ischemia (HCC) 06/29/2020   Snoring 06/29/2020   Sleep apnea in adult 06/29/2020   Mild intermittent asthma 06/29/2020   Hypokalemia 04/24/2020   Lab test positive for detection of COVID-19 virus 04/24/2020   TIA (transient ischemic attack) 04/23/2020   Diverticulosis of colon 01/16/2018   Internal hemorrhoids 01/16/2018   Tubular adenoma 01/16/2018   Paresthesias 04/28/2017   Anxiety disorder 11/05/2016   Edema of lower extremity 11/05/2016   Essential hypertension 11/05/2016   Hyperlipidemia 11/05/2016   Prediabetes 11/05/2016   Restless legs 11/05/2016   Mild persistent asthma in adult without complication 12/12/2011     ONSET DATE: ~Feb 2022  REFERRING DIAG:  G37.9 (ICD-10-CM) - Demyelinating disease of the spinal cord (HCC)  R29.898 (ICD-10-CM) - Right leg weakness  R26.9 (ICD-10-CM) - Gait disturbance    THERAPY DIAG:  Muscle weakness (generalized)  Difficulty in walking, not elsewhere classified  Unsteadiness on feet  Rationale for Evaluation and Treatment: Rehabilitation  SUBJECTIVE:  SUBJECTIVE STATEMENT: No new complaints. Energy has been better. She has a spinal tap next Monday so is cancelling her Tues PT.  EVAL: Pt reports she saw Dr. Godwin Lat (Neurologist) on 09/09/22. Pt had MRI and was diagnosed with MS. Pt is to have a spinal tap to decide further management. Pt reports increased R UE and LE weakness since February 2022 when she initially thought she had a TIA. As time went on, pt reports increased complications on her R side. Pt states she is working with a Systems analyst and silver sneakers (2x/wk). Has started water aerobics in early July but has not been consistent due to vacation and schedule. Pt does note some aching bilat hip pain that can radiate when she's in bed but otherwise no other abnormal sensations or pain. She has a cane but does not use all the time and tries not to use regularly. Pt accompanied by: self  PERTINENT HISTORY: From Dr. Thom Fleeting referral notes: 77 yo woman with probable MS and gait disorder and minimal right weakness   PAIN:  Are you having pain? None  PRECAUTIONS: None  RED FLAGS: None   WEIGHT BEARING RESTRICTIONS: No  FALLS: Has patient fallen in last 6 months? No  LIVING ENVIRONMENT: Lives with: lives alone; has daughters (live in [place]), 3 sisters in [place] Lives in: House/apartment Stairs: No Has upstairs but doesn't need to go up there Has  following equipment at home: Single point cane, 4 prong cane, walker  PLOF: Independent  PATIENT GOALS: Decrease use of cane, strengthen bilat LEs  OBJECTIVE:   DIAGNOSTIC FINDINGS: Recent MRI 09/02/22: IMPRESSION: Findings consistent with the clinical diagnosis of multiple sclerosis. Compared to prior MRI, there is interval progression of the T2 hyperintense lesions in the bilateral corona radiata and genu of the corpus callosum. No focus of abnormal contrast enhancement to suggest active demyelination.  SENSATION: R hand cold and numb  LOWER EXTREMITY ROM:     Active  Right Eval Left Eval  Hip flexion 85 >100  Hip extension    Hip abduction    Hip adduction    Hip internal rotation    Hip external rotation    Knee flexion    Knee extension    Ankle dorsiflexion    Ankle plantarflexion    Ankle inversion    Ankle eversion     (Blank rows = not tested)  LOWER EXTREMITY MMT:    MMT Right Eval Left Eval Right  8/29 Left 8/29  Hip flexion 3- 4 3 4   Hip extension 3+ 3+ 5 4+  Hip abduction 2+ 3+ 2+ 5  Hip adduction      Hip internal rotation      Hip external rotation      Knee flexion 3+ 5 3 5   Knee extension 3+ 5 4+ 5  Ankle dorsiflexion      Ankle plantarflexion      Ankle inversion      Ankle eversion      Grip strength 22# 40#    (Blank rows = not tested)  BED MOBILITY:  Independent  TRANSFERS: Assistive device utilized: None  Sit to stand: Complete Independence Stand to sit: Complete Independence  STAIRS: Level of Assistance: CGA Stair Negotiation Technique: Alternating Pattern  with Single Rail on Left Number of Stairs: 5 steps x 3  Height of Stairs: 4"-6"   Comments: Mild LOB when attempted no rail with descent  GAIT: Gait pattern: step through pattern, decreased step length- Right, decreased stance  time- Right, decreased ankle dorsiflexion- Right, Right foot flat, lateral lean- Right, and poor foot clearance- Right Increased R knee valgus  during stance Distance walked: Multiple laps in clinic during DGI Assistive device utilized: None Level of assistance: CGA  FUNCTIONAL TESTS:  5 times sit to stand: 19.5 sec (needed use of UEs) Dynamic Gait Index: 16/24   11/07/22 18/24    [facility] Adult PT Treatment:                                                DATE: 11/14/22 Therapeutic Exercise: Nustep L4 x 6  Standing  Functional squat to chair +foam x 4 (limited by R knee pain) Mini wall squat  x 5 limited by R knee Hip extension back to wall (glut set) 5 sec x 10 Back to wall, single leg march 5"x5 with SPC support working on activating gluteals of standing leg - cues to avoid hip drop on RLE Heel raises UE support on treadmill rail x 10 x 2  SLS with UE support 2 reps R/L with minimal UE support (fingers)  Hip extension UE support 10 x 2 R/L  Hip abduction leading with heel to recruit glut med UE support 10 x 2 R/L   Attempted hip hikes for glute med strengthening but too difficult   [facility] Adult PT Treatment:                                                DATE: 11/12/22 Therapeutic Exercise: Nustep L2 x 5  Standing  Mini wall squat 5 sec x 6  Hip extension back to wall (glut set) 5 sec x 10 Heel raises UE support on treadmill rail x 10 x 2  SLS with UE support 10 sec x 5 R/L  Hip extension UE support 10 x 2 R/L  Hip abduction leading with heel to recruit glut med UE support 10 x 2 R/L  Stepping fwd practice wt shift standing on R/L x 10 each side  Sitting Sit to stand from elevated surfaces use of UE's as needed  x 5 x 2  Ankle DF green TB x 10 x 2 R/L   Neuromuscular re-ed: Working on standing without locking knees   Gait:    [facility] Adult PT Treatment:                                                DATE: 11/07/2022  Therapeutic Activities: DGI, MMT, goals assessed  Therapeutic Exercise: Prone HS curl R 2x 5 difficult an poor eccentric control Prone hip ext x 10 B Supine hip ABD R x 5 (can't do without ER  hip) Standing side step with Red band 4x4 large steps Standing marching red band at ankles x 10 ea Seated marching B no band x 10 ea to get full range  Seated HS curl on slider R x 10 and with Yellow band x 5 (for HEP)    [facility] Adult PT Treatment:  DATE: 10/31/2022  Therapeutic Exercise: Standing: lateral hip hinge/squat (standing with hands on elevated table, placing one knee behind the other and sliding the knee down the back of the opposite leg), x 10 per side Standing: wide stance pelvic rotations + hip extension (rotating pelvis to one side, contracting contralateral glute toward front of contralateral hip), 5 x 10 sec holds per side Standing: closed-chain foot/ankle inversion (turning pelvis toward working side, maintaining ipsilateral 1st metatarsal head on floor), 10 x 10 sec hold per side Standing: staggered stance progressed tibia (lead leg) hip hinge, hand on table for balance, x 10 per side - pain on R Straddle sitting edge of table: pelvic rocker to low hip hinge (seated, rocking trunk/pelvis forward to lift from table), x 10 Neuromuscular re-ed: Standing: single leg stance on pool noodle, upper extremity support on table as needed, 10 x 10 sec holds  DATE: 10/29/2022  Therapeutic Exercise: Bike 2 min at L 3, then L1 x 4 min Supine short arc quad isometric hold drop sets, 12.5lbs/9.5lbs/5lbs/2lbs on L, 14lbs/9.5 lbs/5lbs/2lbs on R removing each weight at fatigue, Neuromuscular re-ed: Airex pad double limb balance, shoulder width stance, +/- eyes open, eyes closed, head turns R and L, head turns up and down Airex pad double limb balance, narrow stance, +/- eyes open, eyes closed Self Care: Discussion of energy level and diet. Patient looking into shake supplement.   [facility] Adult PT Treatment:                                                DATE: 10/17/2022  Therapeutic Exercise: Seated Fitter board hamstring curls, 1 band, 2x10 reps,  performed R and L Supine short arc quad isometric hold drop sets, 14lbs/9lbs/5lbs/2lbs, removing each weight at fatigue, performed R and L Neuromuscular re-ed: Airex pad double limb balance, shoulder width stance, +/- eyes open, eyes closed, head turns R and L, head turns up and down Airex pad double limb balance, narrow stance, +/- eyes open, eyes closed Trialed assisted foam roller step ups - discontinue due to being too advanced BAPS board double limb balance, level 2 ball, upper extremity assist as needed BAPS board double limb balance with clinician perturbations on board, level 1 ball, upper extremity assist as needed  [facility] Adult PT Treatment:                                                DATE: 10/15/22 Therapeutic Exercise: Recumbent bike L3 x 3 min Gastroc stretch against wall x30 sec Leg press 65# double leg 3x10 (red band around knees) Pball roll knee flex/ext 2x10 (working on control) Knee bent foot on pball, hip IR/ER 2x10 Foot on pball SLR off it 2x10 Sidelying clamshell 2x10 Prone hip ext knee flexed 2x10   PATIENT EDUCATION: Education details: Updated HEP Person educated: Patient Education method: Explanation, Demonstration, and Handouts Education comprehension: verbalized understanding, returned demonstration, and needs further education  HOME EXERCISE PROGRAM: Access Code: [email or web address] URL: [email or web address] Date: 11/07/2022 Prepared by: Concha Deed  Exercises - Small Range Straight Leg Raise  - 1 x daily - 7 x weekly - 1 sets - 10 reps - Prone Knee Flexion  - 1 x daily - 7 x weekly - 1 sets -  10 reps - Supine Bridge with Mini Swiss Ball Between Knees  - 1 x daily - 7 x weekly - 3 sets - 10 reps - Clam with Resistance  - 1 x daily - 7 x weekly - 3 sets - 10 reps - Prone Heel Squeeze  - 1 x daily - 7 x weekly - 3 sets - 10 reps - Side Stepping with Resistance at Ankles and Counter Support  - 1 x daily - 7 x weekly - 3 sets - 10 reps - Long Sitting  Isometric Ankle Eversion in Dorsiflexion with Ball at [employer]  - 1 x daily - 7 x weekly - 2 sets - 10 reps - 5 sec hold - Standing March  - 1 x daily - 7 x weekly - 3 sets - 10 reps - Prone Hip Extension with Bent Knee  - 1 x daily - 7 x weekly - 2 sets - 10 reps - Seated Hamstring Curl with Anchored Resistance  - 1 x daily - 3-4 x weekly - 2 sets - 10 reps   GOALS: Goals reviewed with patient? Yes  SHORT TERM GOALS: Target date: 10/24/2022  Pt will be ind with initial strength and balance HEP Baseline: Goal status: Met  2.  Pt will be able to perform 5x STS without UE assist Baseline:  Goal status: Not met - still requires use of hands to stand from chair  3.  Pt will have improved DGI to >/=18/24 Baseline:  Goal status: MET 11/07/22   LONG TERM GOALS: Target date: 11/21/2022  Pt will be ind with management and progression of HEP and continue community wellness Baseline:  Goal status: INITIAL  2.  Pt will be able to perform 5x STS in </=13 sec to demo decreased fall risk Baseline:  Goal status: INITIAL  3.  Pt will be able to demo improved DGI to >/=22/24 for decreased fall risk Baseline:  Goal status: INITIAL  4.  Pt will be able to amb grocery store (>1000') independently Baseline:  Goal status: INITIAL  5.  Pt will be able to demo at least 4/5 bilat LE strength per pt's personal goals Baseline:  Goal status: IN PROGRESS (see objective chart)   ASSESSMENT:  CLINICAL IMPRESSION: Continued focus on Glute med strengthening exercises in standing. She is not progressing much with sit to stands and today was limited primarily by R knee pain/grinding. She was able to maintain level hips with standing march against wall, so this was issued to replace free standing march. Kammy continues to demonstrate potential for improvement and would benefit from continued skilled therapy to address impairments.    EVAL: Patient is a 77 y.o. F who was seen today for physical therapy  evaluation and treatment for new diagnosis of MS with gait instability. Pt notes history of TIA in 2022 which may have been her initial MS exacerbation. Assessment significant for R LE weakness, decreased ROM, decreased balance with high fall risk, and decreased endurance affecting home and community mobility. Pt will benefit from PT to address these deficits for continued safe participation in recreational activities.   OBJECTIVE IMPAIRMENTS: Abnormal gait, decreased activity tolerance, decreased balance, decreased endurance, decreased mobility, difficulty walking, decreased ROM, decreased strength, impaired UE functional use, and pain.    PLAN:  PT FREQUENCY: 1-2x/week (initially 2x/wk for 4 weeks with plans to wean to 1x/wk)  PT DURATION: 8 weeks  PLANNED INTERVENTIONS: Therapeutic exercises, Therapeutic activity, Neuromuscular re-education, Balance training, Gait training, Patient/Family education, Self Care,  Joint mobilization, Stair training, Dry Needling, Electrical stimulation, Cryotherapy, Moist heat, Taping, Ionotophoresis 4mg /ml Dexamethasone, Manual therapy, and Re-evaluation  PLAN FOR NEXT SESSION: Work on general LE strengthening and balance   Jinx Mourning, PT  11/14/2022, 2:47 PM   PHYSICAL THERAPY DISCHARGE SUMMARY  Visits from Start of Care: 12  Current functional level related to goals / functional outcomes: Unable to assess as pt did not return for f/u visits.    Remaining deficits: Unable to assess as pt did not return for f/u visits.    Education / Equipment: HEP   Patient agrees to discharge. Patient goals were not met. Patient is being discharged due to not returning since the last visit.  Jinx Mourning, PT 07/01/23 9:07 AM  [facility] at [facility] [address] [place], [place] [address]  [phone] (office) [phone] (fax)

## 2022-11-14 ENCOUNTER — Ambulatory Visit: Payer: Medicare HMO | Admitting: Physical Therapy

## 2022-11-14 ENCOUNTER — Encounter: Payer: Self-pay | Admitting: Physical Therapy

## 2022-11-14 DIAGNOSIS — M6281 Muscle weakness (generalized): Secondary | ICD-10-CM | POA: Diagnosis not present

## 2022-11-14 DIAGNOSIS — R262 Difficulty in walking, not elsewhere classified: Secondary | ICD-10-CM

## 2022-11-14 DIAGNOSIS — R2681 Unsteadiness on feet: Secondary | ICD-10-CM

## 2022-11-18 ENCOUNTER — Ambulatory Visit
Admission: RE | Admit: 2022-11-18 | Discharge: 2022-11-18 | Disposition: A | Discharge status: Home / Self Care | Payer: Medicare HMO | Source: Ambulatory Visit | Attending: Neurology | Admitting: Neurology

## 2022-11-18 ENCOUNTER — Telehealth: Payer: Self-pay | Admitting: Neurology

## 2022-11-18 VITALS — BP 142/79 | HR 49

## 2022-11-18 DIAGNOSIS — G379 Demyelinating disease of central nervous system, unspecified: Secondary | ICD-10-CM

## 2022-11-18 DIAGNOSIS — R202 Paresthesia of skin: Secondary | ICD-10-CM

## 2022-11-18 DIAGNOSIS — R200 Anesthesia of skin: Secondary | ICD-10-CM

## 2022-11-18 NOTE — Discharge Instructions (Signed)

## 2022-11-18 NOTE — Telephone Encounter (Signed)
REQUIRED PHONE NOTE: Pt asked about how she will get results to LP

## 2022-11-21 ENCOUNTER — Ambulatory Visit: Payer: Medicare HMO

## 2022-11-24 LAB — CSF CELL COUNT WITH DIFFERENTIAL
RBC Count, CSF: 0 {cells}/uL
TOTAL NUCLEATED CELL: 0 {cells}/uL (ref 0–5)

## 2022-11-24 LAB — OLIGOCLONAL BANDS, CSF + SERM

## 2022-11-24 LAB — CNS IGG SYNTHESIS RATE, CSF+BLOOD
Albumin Serum: 3.7 g/dL (ref 3.6–5.1)
Albumin, CSF: 16.6 mg/dL (ref 8.0–42.0)
CNS-IgG Synthesis Rate: 15.3 mg/(24.h) — ABNORMAL HIGH (ref <=3.3)
IgG (Immunoglobin G), Serum: 1930 mg/dL — ABNORMAL HIGH (ref 600–1540)
IgG Total CSF: 8.4 mg/dL — ABNORMAL HIGH (ref 0.8–7.7)
IgG-Index: 0.97 — ABNORMAL HIGH (ref <0.70)

## 2022-11-24 LAB — GLUCOSE, CSF: Glucose, CSF: 58 mg/dL (ref 40–80)

## 2022-11-24 LAB — VDRL, CSF: VDRL Quant, CSF: NONREACTIVE

## 2022-11-24 LAB — PROTEIN, CSF: Total Protein, CSF: 38 mg/dL (ref 15–60)

## 2022-11-26 NOTE — Telephone Encounter (Signed)
Oligoclonal bands were present, Dr Epimenio Foot will discuss the results next week.   Written by Melvyn Novas, MD on 11/25/2022  4:35 PM EDT Seen by patient Melinda Mckay on 11/25/2022 11:46 PM  Per [name] Even if OCB and IgG index are positive, due to age would not recommend MS Treatment.   [name] our or office till 12-02-2022. Pt has appt 1-/2025.

## 2022-11-27 NOTE — Telephone Encounter (Addendum)
Pt said if do not answer please leave vm letting know you have call. Will get back in touch within 15 min or 20 mins

## 2022-11-28 NOTE — Telephone Encounter (Signed)
I called pt and relayed that got her message.  Did see that she saw the results on mychart/epic.  I relayed that [name] had message that if LP positive, due to age would not recommending treatment.  She said that she is concerned relating to her balance, uses cane.  Doing PT now for the last 14 wks , interested in water aerobics.  Anything to assist in that.  I told her [name] is out this week but next week, should be able to get back with her about next course.  She appreciated call back.

## 2022-12-01 NOTE — Telephone Encounter (Signed)
I saw your message from last week.  I do recommend that she remain active in water aerobics might be useful.  Balance often becomes more of a problem with aging in  MS patients who have spinal cord lesions.  Some patients have found benefit doing exercises that work on balance such as yoga or tai chi.  It is also very important that she control the cardiovascular risk factors and keep her hypertension and diabetes in good control.

## 2022-12-02 NOTE — Telephone Encounter (Signed)
I called pt and relayed the message from Dr .Epimenio Foot.  She verbalized understanding.  She will check out water aerobics, tai chi, yoga.  She will call us back if needed for any new symptoms.  She appreciated call back.

## 2023-02-25 ENCOUNTER — Other Ambulatory Visit: Payer: Self-pay | Admitting: Internal Medicine

## 2023-02-25 DIAGNOSIS — Z1231 Encounter for screening mammogram for malignant neoplasm of breast: Secondary | ICD-10-CM

## 2023-03-10 ENCOUNTER — Telehealth: Payer: Self-pay | Admitting: Neurology

## 2023-03-10 NOTE — Telephone Encounter (Signed)
 Pt confirming upcoming appt details

## 2023-03-27 ENCOUNTER — Ambulatory Visit: Payer: Medicare Other | Admitting: Neurology

## 2023-03-27 ENCOUNTER — Encounter: Payer: Self-pay | Admitting: Neurology

## 2023-03-27 VITALS — BP 124/85 | HR 59 | Ht 65.0 in | Wt 155.5 lb

## 2023-03-27 DIAGNOSIS — M25561 Pain in right knee: Secondary | ICD-10-CM | POA: Diagnosis not present

## 2023-03-27 DIAGNOSIS — R29898 Other symptoms and signs involving the musculoskeletal system: Secondary | ICD-10-CM | POA: Diagnosis not present

## 2023-03-27 DIAGNOSIS — G35 Multiple sclerosis: Secondary | ICD-10-CM

## 2023-03-27 DIAGNOSIS — G8929 Other chronic pain: Secondary | ICD-10-CM

## 2023-03-27 DIAGNOSIS — R202 Paresthesia of skin: Secondary | ICD-10-CM | POA: Diagnosis not present

## 2023-03-27 NOTE — Progress Notes (Signed)
[facility]  PATIENT: Melinda Mckay DOB: [birth year]  REFERRING DOCTOR OR PCP: 05/21/2020 SOURCE: Patient, note from Dr. Lucia Gaskins, imaging and lab reports, MRI images personally reviewed.  _________________________________   HISTORICAL  CHIEF COMPLAINT:  Chief Complaint  Patient presents with   Room 10    Pt is here with her sister. Pt states that her gait issues has gotten a little worse. Pt states that her right knee will buckle due to arthritis and no cartilage. Pt states that she has some fatigue and early morning cramps in both of her legs.     HISTORY OF PRESENT ILLNESS:  Luvern Mckay is a 78 yo woman with multiple sclerosis  Update 03/27/2023: Since the last visit, she had a lumbar puncture 11/18/2022.  The CSF showed greater than 5 gamma restricted bands in the CSF that were not in the serum.  Additionally there were some additional bands present in both CSF and serum combined with her history and imaging studies this is consistent with multiple sclerosis.  We have discussed that given her age I do not think that we need to initiate a disease modifying therapy.   Currently, she is off balanced and uses a cane.  She uses a walker for long distance.  She also has a bad left knee and sees orthopedics.   A cortisone shot had not helped and TKR was suggested.  She veers when she walks probably more to the right.   She has right sided weakness.   She still gets right sided intermittent numbness and the right hand is cold and tingling and she wears a glove.    She has urinary frequency and occasional urge incontinence and 3 times nocturia.   Vision is doing well.    She has periods when she is very tired.  This sometimes occurs after a shower and other ties when she exercises (water is 82 degrees).     She does not note every day. If she rests a while, she feels better.     She gets leg cramps in the calves and toes x many years.  More recently, both legs have  cramps sometimes waking her up.  History of MS/neurologic symptoms: She was found on imaging studies to have multiple foci within the spinal cord worrisome for MS.   She also has foci in the brain but these have an appearance that is more typical for chronic microvascular ischemic change.    She had a TIA in 04/2020.   She had right sided tingling/numbness including her face.  She noted she could still walk and could talk but made her way back to her car.  She then drove to an urgent care.  Her BP was elevated and she was positive for Covid-19.  She was taken to [facility] ED.   MRI showed cahnges c/w SVID and she had a possible tiny spot on DWI.    Iitial symptoms lasted 5-10 minutes but then she had intermittent Numbness over the next couple weeks.   She noted balance issues later in 2022 and had MRI of the brain and cervical spine.   By my interpretation she had several foci in the cervical spinal cord c/w MS chronic demyelination though read as normal spinal cord ad multilevel DJD without critical stenosis.        Imaging: MRI of the cervical spine 07/07/2022 showed T2 hyperintense foci posteriorly to the right adjacent to C2, to the right adjacent to C3 posteriorly adjacent to  C3, posterolaterally to the right adjacent to C4, posterolaterally to the left adjacent to C5, towards the right adjacent to C6 and posterolaterally to the right adjacent to T1.  The T1 focus is not clearly seen on the previous MRI from 05/21/2020 while other foci seem unchanged.  There are multilevel degenerative changes noted.  There is mild spinal stenosis at C3-C4, C5-C6 and C6-C7.  There is foraminal narrowing that could affect the nerve roots bilaterally at C5-C6 and C6-C7 and to the left at C4-C5  MRI of the brain 09/02/2022 shows multiple T2/FLAIR hyperintense foci.  The vast majority are in the deep white matter with confluencies in the frontal lobes and the periatrial white matter.  Foci also noted in the pons.  There are very  few periventricular or juxtacortical foci.  Most of the changes are likely due to chronic microvascular ischemic disease rather than demyelination.  There is very slight progression compared to the MRI from 04/24/2020   REVIEW OF SYSTEMS: Constitutional: No fevers, chills, sweats, or change in appetite Eyes: No visual changes, double vision, eye pain Ear, nose and throat: No hearing loss, ear pain, nasal congestion, sore throat Cardiovascular: No chest pain, palpitations Respiratory:  No shortness of breath at rest or with exertion.   No wheezes GastrointestinaI: No nausea, vomiting, diarrhea, abdominal pain, fecal incontinence Genitourinary:  No dysuria, urinary retention or frequency.  No nocturia. Musculoskeletal:  No neck pain, back pain Integumentary: No rash, pruritus, skin lesions Neurological: as above Psychiatric: No depression at this time.  No anxiety Endocrine: No palpitations, diaphoresis, change in appetite, change in weigh or increased thirst Hematologic/Lymphatic:  No anemia, purpura, petechiae. Allergic/Immunologic: No itchy/runny eyes, nasal congestion, recent allergic reactions, rashes  ALLERGIES: No Known Allergies  HOME MEDICATIONS:  Current Outpatient Medications:    acetaminophen (TYLENOL) 500 MG tablet, Take 500 mg by mouth every 6 (six) hours as needed for mild pain., Disp: , Rfl:    albuterol (PROAIR HFA) 108 (90 Base) MCG/ACT inhaler, 2 puffs every 4 hours as needed only  if your can't catch your breath (Patient taking differently: Inhale 2 puffs into the lungs every 4 (four) hours as needed for wheezing or shortness of breath.), Disp: 18 g, Rfl: 2   ALPRAZolam (XANAX) 1 MG tablet, Take 1 mg by mouth 2 (two) times daily as needed for anxiety., Disp: , Rfl:    amLODipine (NORVASC) 10 MG tablet, Take 5 mg by mouth daily., Disp: , Rfl:    beclomethasone (QVAR REDIHALER) 80 MCG/ACT inhaler, Inhale 2 puffs into the lungs daily., Disp: 10.6 g, Rfl: 11    calcium-vitamin D (OSCAL WITH D) 500-200 MG-UNIT tablet, Take 1 tablet by mouth daily with breakfast., Disp: , Rfl:    cholecalciferol (VITAMIN D3) 25 MCG (1000 UNIT) tablet, Take 1,000 Units by mouth daily., Disp: , Rfl:    clopidogrel (PLAVIX) 75 MG tablet, Take 1 tablet (75 mg total) by mouth daily., Disp: 30 tablet, Rfl: 0   ezetimibe (ZETIA) 10 MG tablet, TAKE 1 TABLET BY MOUTH ONCE DAILY FOR 90 DAYS, Disp: , Rfl:    hydrochlorothiazide (HYDRODIURIL) 25 MG tablet, Take 25 mg by mouth daily., Disp: , Rfl:    hydrOXYzine (ATARAX/VISTARIL) 25 MG tablet, Take 25 mg by mouth at bedtime as needed for itching., Disp: , Rfl:    losartan (COZAAR) 100 MG tablet, Take 100 mg by mouth daily., Disp: , Rfl:    metFORMIN (GLUCOPHAGE-XR) 500 MG 24 hr tablet, Take 500 mg by mouth 2 (  two) times daily., Disp: , Rfl:    Omega-3 Fatty Acids (FISH OIL) 1000 MG CAPS, Take 1,000 mg by mouth daily., Disp: , Rfl:    potassium chloride SA (K-DUR,KLOR-CON) 20 MEQ tablet, Take 20 mEq by mouth daily., Disp: , Rfl:    rosuvastatin (CRESTOR) 40 MG tablet, Take 40 mg by mouth daily., Disp: , Rfl:    clopidogrel (PLAVIX) 75 MG tablet, Take 1 tablet by mouth daily. (Patient not taking: Reported on 03/27/2023), Disp: , Rfl:   PAST MEDICAL HISTORY: Past Medical History:  Diagnosis Date   Anxiety    Asthma    Diabetes mellitus without complication (HCC)    Edema of lower extremity    Hyperlipidemia    Hypertension    Prediabetes    Restless legs     PAST SURGICAL HISTORY: Past Surgical History:  Procedure Laterality Date   bone spur removal Right    BUNIONECTOMY Bilateral 2005   PARTIAL HYSTERECTOMY  2008   TUBAL LIGATION  1982    FAMILY HISTORY: Family History  Problem Relation Age of Onset   Cancer Sister        KIDNEY    Allergies Sister    Asthma Sister    Allergies Brother    Diabetes Brother    Hypertension Brother    Asthma Brother     SOCIAL HISTORY: Social History   Socioeconomic History    Marital status: Widowed    Spouse name: Not on file   Number of children: 2   Years of education: Not on file   Highest education level: Bachelor's degree (e.g., BA, AB, BS)  Occupational History   Occupation: RETIRED   Tobacco Use   Smoking status: Former    Current packs/day: 0.00    Average packs/day: 0.4 packs/day for 20.0 years (8.0 ttl pk-yrs)    Types: Cigarettes    Start date: 03/11/1973    Quit date: 03/11/1993    Years since quitting: 30.0    Passive exposure: Never   Smokeless tobacco: Never   Tobacco comments:    PT STATES ABOUT 1 PACK A WEEK   Vaping Use   Vaping status: Never Used  Substance and Sexual Activity   Alcohol use: No    Comment: quit 2015   Drug use: No   Sexual activity: Not on file  Other Topics Concern   Not on file  Social History Narrative   Lives at home alone   Right handed   Drinks 1 cup of coffee daily   Social Drivers of Corporate investment banker Strain: Not on file  Food Insecurity: Not on file  Transportation Needs: Not on file  Physical Activity: Not on file  Stress: Not on file  Social Connections: Unknown (04/05/2022)   [facility] from [facility], [facility]   Social Network    Social Network: Not on file  Intimate Partner Violence: Unknown (04/05/2022)   Received from [facility], [facility]   HITS    Physically Hurt: Not on file    Insult or Talk Down To: Not on file    Threaten Physical Harm: Not on file    Scream or Curse: Not on file       PHYSICAL EXAM  Vitals:   03/27/23 1047  BP: 124/85  Pulse: (!) 59  Weight: 155 lb 8 oz (70.5 kg)  Height: 5\' 5"  (1.651 m)     Body mass index is 25.88 kg/m.   General: The patient is well-developed  and well-nourished and in no acute distress  HEENT:  Head is [place]/AT.  Sclera are anicteric.    Skin: Extremities are without rash or  edema.  Musculoskeletal:  Back is nontender  Neurologic Exam  Mental status: The patient is alert and oriented x 3 at the  time of the examination. The patient has apparent normal recent and remote memory, with an apparently normal attention span and concentration ability.   Speech is normal.  Cranial nerves: Extraocular movements are full. Pupils are equal, round, and reactive to light and accomodation.  Visual fields are full.  Facial symmetry is present. Slight reduced lower face sensation to temperature.  .Facial strength is normal.  Trapezius and sternocleidomastoid strength is normal. No dysarthria is noted.  . No obvious hearing deficits are noted.  Motor:  Muscle bulk is normal.   Tone is normal. Strength is  5 / 5 on the left but 4+/5 strength in mos right arm muscles, but 4-/5 right iliopsoas and right toe extension, 4/5 other right leg muscles   Sensory: Sensory testing is intact to pinprick, soft touch and vibration sensation in all 4 extremities.  Coordination: Cerebellar testing reveals good finger-nose-finger and heel-to-shin bilaterally.  Gait and station: Station is normal.   Gait is arthritic and mild right leg drop but can walk without a cane. Romberg is negative.   Reflexes: Deep tendon reflexes are symmetric and normal in arms and increased at knees with spread, right more than left.   No ankle clonus.  Plantar responses are flexor.    DIAGNOSTIC DATA (LABS, IMAGING, TESTING) - I reviewed patient records, labs, notes, testing and imaging myself where available.  Lab Results  Component Value Date   WBC 5.6 04/24/2020   HGB 12.7 04/24/2020   HCT 38.9 04/24/2020   MCV 87.8 04/24/2020   PLT 250 04/24/2020      Component Value Date/Time   NA 141 04/24/2020 0410   K 3.8 04/24/2020 0410   CL 104 04/24/2020 0410   CO2 28 04/24/2020 0410   GLUCOSE 113 (H) 04/24/2020 0410   BUN 16 04/24/2020 0410   CREATININE 0.72 04/24/2020 0410   CALCIUM 9.7 04/24/2020 0410   PROT 6.8 04/24/2020 0410   ALBUMIN 3.7 11/18/2022 1208   AST 19 04/24/2020 0410   ALT 15 04/24/2020 0410   ALKPHOS 54  04/24/2020 0410   BILITOT 0.6 04/24/2020 0410   GFRNONAA >60 04/24/2020 0410   GFRAA >60 02/28/2017 1340   Lab Results  Component Value Date   CHOL 171 04/24/2020   HDL 56 04/24/2020   LDLCALC 104 (H) 04/24/2020   TRIG 55 04/24/2020   CHOLHDL 3.1 04/24/2020   Lab Results  Component Value Date   HGBA1C 6.3 (H) 04/24/2020       ASSESSMENT AND PLAN  No diagnosis found.   She has MS.  Likely had a spinal relapse in 2022.  If another exacerbation occurs, recommend we start a DMT.  She is neurologically cleared for TKR.    She may require additional rehab due to MS related right weakness She is having some blood work with her primary care next week and I asked her to see if she can get vitamin D added and supplement if needed Stable return to see me in 12 months or sooner if there are new or worsening neurologic symptoms.  This visit is part of a comprehensive longitudinal care medical relationship regarding the patients primary diagnosis of MS and related concerns.  Danelly Hassinger A. Epimenio Foot, MD, [facility] 03/27/2023, 11:20 AM Certified in Neurology, Clinical Neurophysiology, Sleep Medicine and Neuroimaging  [facility] [address], [address] [place], [place] [address] [phone]

## 2023-04-01 DIAGNOSIS — M1711 Unilateral primary osteoarthritis, right knee: Secondary | ICD-10-CM | POA: Diagnosis not present

## 2023-04-09 ENCOUNTER — Ambulatory Visit
Admission: RE | Admit: 2023-04-09 | Discharge: 2023-04-09 | Disposition: A | Discharge status: Home / Self Care | Payer: Medicare Other | Source: Ambulatory Visit | Attending: Internal Medicine | Admitting: Internal Medicine

## 2023-04-09 DIAGNOSIS — Z1231 Encounter for screening mammogram for malignant neoplasm of breast: Secondary | ICD-10-CM

## 2023-04-14 DIAGNOSIS — M1711 Unilateral primary osteoarthritis, right knee: Secondary | ICD-10-CM | POA: Diagnosis not present

## 2023-04-24 ENCOUNTER — Other Ambulatory Visit: Payer: Self-pay | Admitting: Neurology

## 2023-04-24 DIAGNOSIS — K080 Exfoliation of teeth due to systemic causes: Secondary | ICD-10-CM | POA: Diagnosis not present

## 2023-04-24 DIAGNOSIS — R200 Anesthesia of skin: Secondary | ICD-10-CM

## 2023-04-24 DIAGNOSIS — R531 Weakness: Secondary | ICD-10-CM

## 2023-05-06 DIAGNOSIS — H524 Presbyopia: Secondary | ICD-10-CM | POA: Diagnosis not present

## 2023-05-06 DIAGNOSIS — H31092 Other chorioretinal scars, left eye: Secondary | ICD-10-CM | POA: Diagnosis not present

## 2023-05-06 DIAGNOSIS — E119 Type 2 diabetes mellitus without complications: Secondary | ICD-10-CM | POA: Diagnosis not present

## 2023-05-06 DIAGNOSIS — H40013 Open angle with borderline findings, low risk, bilateral: Secondary | ICD-10-CM | POA: Diagnosis not present

## 2023-05-06 DIAGNOSIS — G35 Multiple sclerosis: Secondary | ICD-10-CM | POA: Diagnosis not present

## 2023-05-13 ENCOUNTER — Other Ambulatory Visit: Payer: Self-pay

## 2023-05-13 DIAGNOSIS — R202 Paresthesia of skin: Secondary | ICD-10-CM

## 2023-05-14 ENCOUNTER — Ambulatory Visit: Admitting: Neurology

## 2023-05-14 DIAGNOSIS — R202 Paresthesia of skin: Secondary | ICD-10-CM

## 2023-05-14 DIAGNOSIS — R29898 Other symptoms and signs involving the musculoskeletal system: Secondary | ICD-10-CM

## 2023-05-14 NOTE — Procedures (Signed)
  [facility]  [address] [place], [address]  [place], [place] [address] Tel: [phone] Fax: [phone] Test Date:  05/14/2023  Patient: Melinda Mckay DOB: [birth year] Physician: Nita Sickle, DO  Sex: Female Height: 5\' 5"  Ref Phys: Eula Listen, MD  ID#: [id number]   Technician:    History: This is a 78 year old female with history of multiple sclerosis referred for evaluation of right leg weakness.  NCV & EMG Findings: Extensive electrodiagnostic testing of the right lower extremity shows:  Right sural and superficial peroneal sensory responses are within normal limits. Right peroneal and tibial motor responses are within normal limits. There is a generalized pattern of incomplete motor unit activation as seen by variable recruitment of the tested muscles, however motor unit configuration is within normal limits.  These findings can be seen with pain, poor effort, or central disorder of motor unit control.  Impression: There is no evidence of a lumbosacral radiculopathy, sensorimotor polyneuropathy, or diffuse myopathy affecting the right lower extremity.  There is incomplete motor unit activation in all the tested muscles which is most likely due central disorder of motor unit control (history of multiple sclerosis), less likely pain or poor effort.     ___________________________ Nita Sickle, DO    Nerve Conduction Studies   Stim Site NR Peak (ms) Norm Peak (ms) O-P Amp (V) Norm O-P Amp  Right Sup Peroneal Anti Sensory (Ant Lat Mall)  32 C  12 cm    2.0 <4.6 4.7 >3  Right Sural Anti Sensory (Lat Mall)  32 C  Calf    2.9 <4.6 8.2 >3     Stim Site NR Onset (ms) Norm Onset (ms) O-P Amp (mV) Norm O-P Amp Site1 Site2 Delta-0 (ms) Dist (cm) Vel (m/s) Norm Vel (m/s)  Right Peroneal Motor (Ext Dig Brev)  32 C  Ankle    5.2 <6.0 2.6 >2.5 B Fib Ankle 8.3 39.0 47 >40  B Fib    13.5  2.0  Poplt B Fib 1.2 7.0 58 >40  Poplt    14.7  2.1         Right Peroneal TA  Motor (Tib Ant)  32 C  Fib Head    3.8 <4.5 5.5 >3 Poplit Fib Head 1.6 8.0 50 >40  Poplit    5.4 <5.7 5.0         Right Tibial Motor (Abd Hall Brev)  32 C  Ankle    4.9 <6.0 12.6 >4 Knee Ankle 9.8 44.0 45 >40  Knee    14.7  7.2          Electromyography   Side Muscle Ins.Act Fibs Fasc Recrt Amp Dur Poly Activation Comment  Right AntTibialis Nml Nml Nml *1- Nml Nml Nml *Variable N/A  Right Gastroc Nml Nml Nml *1- Nml Nml Nml *Variable N/A  Right Flex Dig Long Nml Nml Nml *1- Nml Nml Nml *Variable N/A  Right RectFemoris Nml Nml Nml *2- Nml Nml Nml *Variable N/A  Right BicepsFemS Nml Nml Nml *2- Nml Nml Nml *Variable N/A  Right GluteusMed Nml Nml Nml *2- Nml Nml Nml *Variable N/A      Waveforms:

## 2023-05-20 DIAGNOSIS — G35 Multiple sclerosis: Secondary | ICD-10-CM | POA: Diagnosis not present

## 2023-05-20 DIAGNOSIS — E1165 Type 2 diabetes mellitus with hyperglycemia: Secondary | ICD-10-CM | POA: Diagnosis not present

## 2023-05-20 DIAGNOSIS — I10 Essential (primary) hypertension: Secondary | ICD-10-CM | POA: Diagnosis not present

## 2023-05-20 DIAGNOSIS — I700 Atherosclerosis of aorta: Secondary | ICD-10-CM | POA: Diagnosis not present

## 2023-05-27 NOTE — Progress Notes (Unsigned)
 Subjective:     Patient ID: Melinda Mckay, female   DOB: Mar 01, 1946,    MRN: [id number]    Brief patient profile:  64 yobf quit smoking 1995 with dx of asthma by Dr Shelle Iron final ov  11/10/13 rec  maint asthmanex 220 2 each day/ prn saba  Primary is Dr Corky Downs   History of Present Illness  09/11/2015  1st   office visit/ Melinda Mckay  - transition of care Chief Complaint  Patient presents with   Follow-up    Former Dr Shelle Iron pt here for medication refill. Pt states that her breathing is overall doing well and denies any co's today. She very rarely uses albuterol.   very confused with meds, thinks qvar is a rescue med/ no problem from coughing on asmanex dpi  rec Plan A = Automatic = asthmanex or qvar 2 puffs daily  Plan B = Backup Only use your albuterol(Proair) as a rescue medication  GERD  Diet     09/10/2016  f/u ov/Melinda Mckay re:  Mild persistent asthma / qvar 80 2 pffs each pm  Chief Complaint  Patient presents with   Follow-up    Breathing is doing well. No new co's. She very rarely uses her rescue inhaler.   Not limited by breathing from desired activities but very sedentary   rec Plan A = Automatic = Qvar 80  2 pffs daily  Work on inhaler technique:  Plan B = Backup Only use your albuterol (proair)  as a rescue medication    01/17/2020  f/u ov/Melinda Mckay re: mild chronic asthma on qvar 80 2pffs /day never increased even with recent uri  Chief Complaint  Patient presents with   Follow-up    Breathing is doing well and she has not needed her albuterol.    Dyspnea:  Not limited by breathing from desired activities   Cough: none  Sleeping: flat is flat/ one pillow SABA use: none  02: none   Rec Plan A = Automatic = Qvar 80 2pffs daily - increase to 2 puffs every 12 hours for any flare of respiratory symptoms  Work on inhaler technique:  Plan B = Backup Only use your albuterol as a rescue medication     09/18/2021  f/u ov/Melinda Mckay re: mild asthma   maint on qvar  80 2 daily  Chief  Complaint  Patient presents with   Follow-up    Pt states she had a minor stroke x1 year ago and pneumonia x6 weeks ago. No cough and no SOB   Dyspnea:  Not limited by breathing from desired activities  / did PT p cva R leg  Cough: none  Sleeping: flat bed/ one pillow SABA use: rarely  02: none  Rec No change in medications Work on inhaler technique:   Follow up yearly or for refills   Dx MS    Sept 2024   05/28/2023  f/u ov/Melinda Mckay re: mild asthma   maint on on qvar 80 2 each am   Chief Complaint  Patient presents with   Follow-up    Needs refill on Albuterol and Qvar. Doing well.   Dyspnea:  not limited by breathing / water aerobics three times per week/ grocery ok  Cough: none  Sleeping: flat bed one pillow s  resp cc  SABA use: never  02: none   No obvious day to day or daytime variability or assoc excess/ purulent sputum or mucus plugs or hemoptysis or cp or chest tightness, subjective wheeze or overt  sinus or hb symptoms.    Also denies any obvious fluctuation of symptoms with weather or environmental changes or other aggravating or alleviating factors except as outlined above   No unusual exposure hx or h/o childhood pna/ asthma or knowledge of premature birth.  Current Allergies, Complete Past Medical History, Past Surgical History, Family History, and Social History were reviewed in [employer] record.  ROS  The following are not active complaints unless bolded Hoarseness, sore throat, dysphagia, dental problems, itching, sneezing,  nasal congestion or discharge of excess mucus or purulent secretions, ear ache,   fever, chills, sweats, unintended wt loss or wt gain, classically pleuritic or exertional cp,  orthopnea pnd or arm/hand swelling  or leg swelling, presyncope, palpitations, abdominal pain, anorexia, nausea, vomiting, diarrhea  or change in bowel habits or change in bladder habits, change in stools or change in urine, dysuria, hematuria,  rash,  arthralgias, visual complaints, headache, numbness, weakness or ataxia or problems with walking or coordination,  change in mood or  memory.        Current Meds  Medication Sig   acetaminophen (TYLENOL) 500 MG tablet Take 500 mg by mouth every 6 (six) hours as needed for mild pain.   ALPRAZolam (XANAX) 1 MG tablet Take 1 mg by mouth 2 (two) times daily as needed for anxiety.   amLODipine (NORVASC) 10 MG tablet Take 5 mg by mouth daily.   beclomethasone (QVAR REDIHALER) 80 MCG/ACT inhaler Inhale 2 puffs into the lungs daily.   calcium-vitamin D (OSCAL WITH D) 500-200 MG-UNIT tablet Take 1 tablet by mouth daily with breakfast.   cholecalciferol (VITAMIN D3) 25 MCG (1000 UNIT) tablet Take 1,000 Units by mouth daily.   clopidogrel (PLAVIX) 75 MG tablet Take 1 tablet (75 mg total) by mouth daily.   Continuous Glucose Receiver (DEXCOM G6 RECEIVER) DEVI by Does not apply route. Wears daily   ezetimibe (ZETIA) 10 MG tablet TAKE 1 TABLET BY MOUTH ONCE DAILY FOR 90 DAYS   hydrochlorothiazide (HYDRODIURIL) 25 MG tablet Take 25 mg by mouth daily.   hydrOXYzine (ATARAX/VISTARIL) 25 MG tablet Take 25 mg by mouth at bedtime as needed for itching.   losartan (COZAAR) 100 MG tablet Take 100 mg by mouth daily.   metFORMIN (GLUCOPHAGE-XR) 500 MG 24 hr tablet Take 500 mg by mouth 2 (two) times daily.   potassium chloride SA (K-DUR,KLOR-CON) 20 MEQ tablet Take 20 mEq by mouth daily.   rosuvastatin (CRESTOR) 40 MG tablet Take 40 mg by mouth daily.                    Objective:   Physical Exam  Wts   05/28/2023       158   09/18/2021       163 01/17/2020       182 10/15/2017         176  09/10/2016         175   09/11/15 178 lb (80.74 kg)  11/10/13 184 lb (83.462 kg)  11/11/12 197 lb (89.359 kg)     Vital signs reviewed  05/28/2023  - Note at rest 02 sats  100% on RA   General appearance:    pleasant amb bf / using rollator   HEENT : Oropharynx  clear      Nasal turbinates nl    NECK :  without   apparent JVD/ palpable Nodes/TM    LUNGS: no acc muscle use,  Nl contour chest which  is clear to A and P bilaterally without cough on insp or exp maneuvers   CV:  RRR  no s3 or murmur or increase in P2, and no edema   ABD:  soft and nontender   MS:  ext warm without deformities Or obvious joint restrictions  calf tenderness, cyanosis or clubbing    SKIN: warm and dry without lesions    NEURO:  alert, approp, nl sensorium with  no motor or cerebellar deficits apparent.          Assessment:

## 2023-05-28 ENCOUNTER — Encounter: Payer: Self-pay | Admitting: Internal Medicine

## 2023-05-28 ENCOUNTER — Ambulatory Visit: Payer: Medicare Other | Admitting: Internal Medicine

## 2023-05-28 VITALS — BP 110/62 | HR 69 | Temp 97.7°F | Ht 65.0 in | Wt 158.2 lb

## 2023-05-28 DIAGNOSIS — J4530 Mild persistent asthma, uncomplicated: Secondary | ICD-10-CM

## 2023-05-28 MED ORDER — ALBUTEROL SULFATE HFA 108 (90 BASE) MCG/ACT IN AERS: INHALATION_SPRAY | RESPIRATORY_TRACT | 1 refills | Status: AC

## 2023-05-28 MED ORDER — QVAR REDIHALER 80 MCG/ACT IN AERB: 2.0000 | INHALATION_SPRAY | Freq: Every day | RESPIRATORY_TRACT | 11 refills | Status: AC

## 2023-05-28 NOTE — Patient Instructions (Signed)
 No change in medications   Work on inhaler technique:  relax and gently blow all the way out then take a nice smooth full deep breath back in, triggering the inhaler at same time you start breathing in.  Hold breath in for at least  5 seconds if you can.     Please schedule a follow up visit in 12  months but call sooner if needed

## 2023-05-28 NOTE — Assessment & Plan Note (Addendum)
 Quit smoking 1995  PFT's 08/2011:  FEV1 1.81 (84%), ratio 69, 19% change with BD and return of ratio to normal, ++airtrapping. - 09/10/2016  After extensive coaching HFA effectiveness =   75% even with autohaler (short Ti) - FENO 10/15/2017  =   24   - 01/17/2020  After extensive coaching inhaler device,  effectiveness =  75%  From a baseline 50%  - 05/28/2023  After extensive coaching inhaler device,  effectiveness =    50% (poor insp flow)   Despite suboptimal hfa, All goals of chronic asthma control met including optimal function and elimination of symptoms with minimal need for rescue therapy, likely because her asthma is so mild   Contingencies discussed in full including contacting this office immediately if not controlling the symptoms using the rule of two's.     F/u can be yearly, sooner prn          Each maintenance medication was reviewed in detail including emphasizing most importantly the difference between maintenance and prns and under what circumstances the prns are to be triggered using an action plan format where appropriate.  Total time for H and P, chart review, counseling, reviewing hfa device(s) and generating customized AVS unique to this office visit / same day charting = 

## 2023-06-04 DIAGNOSIS — M21371 Foot drop, right foot: Secondary | ICD-10-CM | POA: Diagnosis not present

## 2023-07-28 DIAGNOSIS — M1711 Unilateral primary osteoarthritis, right knee: Secondary | ICD-10-CM | POA: Diagnosis not present

## 2023-08-05 DIAGNOSIS — I10 Essential (primary) hypertension: Secondary | ICD-10-CM | POA: Diagnosis not present

## 2023-08-05 DIAGNOSIS — E785 Hyperlipidemia, unspecified: Secondary | ICD-10-CM | POA: Diagnosis not present

## 2023-08-05 DIAGNOSIS — E1165 Type 2 diabetes mellitus with hyperglycemia: Secondary | ICD-10-CM | POA: Diagnosis not present

## 2023-08-05 DIAGNOSIS — E559 Vitamin D deficiency, unspecified: Secondary | ICD-10-CM | POA: Diagnosis not present

## 2023-08-15 DIAGNOSIS — I10 Essential (primary) hypertension: Secondary | ICD-10-CM | POA: Diagnosis not present

## 2023-08-15 DIAGNOSIS — G35 Multiple sclerosis: Secondary | ICD-10-CM | POA: Diagnosis not present

## 2023-08-15 DIAGNOSIS — E1165 Type 2 diabetes mellitus with hyperglycemia: Secondary | ICD-10-CM | POA: Diagnosis not present

## 2023-08-15 DIAGNOSIS — M1711 Unilateral primary osteoarthritis, right knee: Secondary | ICD-10-CM | POA: Diagnosis not present

## 2023-08-21 DIAGNOSIS — E539 Vitamin B deficiency, unspecified: Secondary | ICD-10-CM | POA: Diagnosis not present

## 2023-08-21 DIAGNOSIS — G35 Multiple sclerosis: Secondary | ICD-10-CM | POA: Diagnosis not present

## 2023-08-21 DIAGNOSIS — Z1329 Encounter for screening for other suspected endocrine disorder: Secondary | ICD-10-CM | POA: Diagnosis not present

## 2023-09-08 DIAGNOSIS — K080 Exfoliation of teeth due to systemic causes: Secondary | ICD-10-CM | POA: Diagnosis not present

## 2023-10-25 DIAGNOSIS — M549 Dorsalgia, unspecified: Secondary | ICD-10-CM | POA: Diagnosis not present

## 2023-10-25 DIAGNOSIS — M545 Low back pain, unspecified: Secondary | ICD-10-CM | POA: Diagnosis not present

## 2023-11-25 DIAGNOSIS — Z23 Encounter for immunization: Secondary | ICD-10-CM | POA: Diagnosis not present

## 2023-11-25 DIAGNOSIS — I10 Essential (primary) hypertension: Secondary | ICD-10-CM | POA: Diagnosis not present

## 2023-11-25 DIAGNOSIS — G35 Multiple sclerosis: Secondary | ICD-10-CM | POA: Diagnosis not present

## 2023-11-25 DIAGNOSIS — R300 Dysuria: Secondary | ICD-10-CM | POA: Diagnosis not present

## 2023-11-25 DIAGNOSIS — E1165 Type 2 diabetes mellitus with hyperglycemia: Secondary | ICD-10-CM | POA: Diagnosis not present

## 2023-11-25 DIAGNOSIS — M4802 Spinal stenosis, cervical region: Secondary | ICD-10-CM | POA: Diagnosis not present

## 2023-11-26 ENCOUNTER — Encounter: Payer: Self-pay | Admitting: Neurology

## 2023-11-26 NOTE — Telephone Encounter (Signed)
 Non-urgent message pt aware you are out of the office until 12/09/23.

## 2023-12-08 ENCOUNTER — Other Ambulatory Visit: Payer: Self-pay | Admitting: Neurology

## 2023-12-09 NOTE — Telephone Encounter (Signed)
 sent to GI to schedule in December. [phone]

## 2023-12-10 NOTE — Therapy (Incomplete)
 OUTPATIENT PHYSICAL THERAPY THORACOLUMBAR EVALUATION   Patient Name: Melinda Mckay MRN: [id number] DOB:Aug 08, 1945, 78 y.o., female Today's Date: 12/10/2023  END OF SESSION:   Past Medical History:  Diagnosis Date   Anxiety    Asthma    Diabetes mellitus without complication (HCC)    Edema of lower extremity    Hyperlipidemia    Hypertension    Multiple sclerosis    Prediabetes    Restless legs    Past Surgical History:  Procedure Laterality Date   bone spur removal Right    BUNIONECTOMY Bilateral 2005   PARTIAL HYSTERECTOMY  2008   TUBAL LIGATION  1982   Patient Active Problem List   Diagnosis Date Noted   Cerebrovascular accident (CVA) due to occlusion of small artery (HCC) 10/30/2020   Numbness and tingling in right hand 10/30/2020   Acute stroke due to ischemia (HCC) 06/29/2020   Snoring 06/29/2020   Sleep apnea in adult 06/29/2020   Mild intermittent asthma 06/29/2020   Hypokalemia 04/24/2020   Lab test positive for detection of COVID-19 virus 04/24/2020   TIA (transient ischemic attack) 04/23/2020   Diverticulosis of colon 01/16/2018   Internal hemorrhoids 01/16/2018   Tubular adenoma 01/16/2018   Paresthesias 04/28/2017   Anxiety disorder 11/05/2016   Edema of lower extremity 11/05/2016   Essential hypertension 11/05/2016   Hyperlipidemia 11/05/2016   Prediabetes 11/05/2016   Restless legs 11/05/2016   Mild persistent asthma in adult without complication 12/12/2011    PCP: ***  REFERRING PROVIDER: ***  REFERRING DIAG: ***  Rationale for Evaluation and Treatment: Rehabilitation  THERAPY DIAG:  No diagnosis found.  ONSET DATE: ***  SUBJECTIVE:                                                                                                                                                                                           SUBJECTIVE STATEMENT: ***  PERTINENT HISTORY:  anxiety, asthma, DM, HTN, MS, TIA/CVA  PAIN:  Are you having  pain? {OPRCPAIN:27236}  PRECAUTIONS: neuro hx, fall risk   RED FLAGS: {PT Red Flags:29287}   WEIGHT BEARING RESTRICTIONS: No  FALLS:  Has patient fallen in last 6 months? {fallsyesno:27318}  LIVING ENVIRONMENT: Lives with: {[facility] lives with:25569::lives with their family} Lives in: {Lives in:25570} Stairs: {opstairs:27293} Has following equipment at home: {Assistive devices:23999}  OCCUPATION: ***  PLOF: {PLOF:24004}  PATIENT GOALS: ***  NEXT MD VISIT: ***  OBJECTIVE:  Note: Objective measures were completed at Evaluation unless otherwise noted.  DIAGNOSTIC FINDINGS:  No recent imaging in chart  PATIENT SURVEYS:  {rehab surveys:24030}  COGNITION: Overall cognitive status: {cognition:24006}  SENSATION: {sensation:27233}  MUSCLE LENGTH: Hamstrings: Right *** deg; Left *** deg Debby test: Right *** deg; Left *** deg  POSTURE: {posture:25561}  PALPATION: ***  LUMBAR ROM:   AROM eval  Flexion   Extension   Right lateral flexion   Left lateral flexion   Right rotation   Left rotation    (Blank rows = not tested)  RANGE OF MOTION:     {AROM/PROM:27142}  Right eval Left eval  Shoulder flexion    Functional ER combo    Functional IR combo    Knee extension    Ankle dorsiflexion     (Blank rows = not tested) (Key: WFL = within functional limits not formally assessed, * = concordant pain, s = stiffness/stretching sensation, NT = not tested)  Comments:    STRENGTH TESTING:  MMT Right eval Left eval  Shoulder flexion    Shoulder abduction    Elbow flexion    Elbow extension    Grip strength (gross)    Hip flexion    Hip abduction (modified sitting)    Knee flexion    Knee extension    Ankle dorsiflexion    Ankle plantarflexion     (Blank rows = not tested) (Key: WFL = within functional limits not formally assessed, * = concordant pain, s = stiffness/stretching sensation, NT = not tested)  Comments:    SPECIAL TESTS:  ***    FUNCTIONAL TESTS:  {Functional tests:24029}  GAIT: Distance walked: *** Assistive device utilized: {Assistive devices:23999} Level of assistance: {Levels of assistance:24026} Comments: ***  TREATMENT [facility] Adult PT Treatment:                                                DATE: *** Therapeutic Exercise: *** Manual Therapy: *** Neuromuscular re-ed: *** Therapeutic Activity: *** Modalities: *** Self Care: ***                                                                                                                                  PATIENT EDUCATION:  Education details: Pt education on PT impairments, prognosis, and POC. Informed consent. Rationale for interventions, safe/appropriate HEP performance Person educated: Patient Education method: Explanation, Demonstration, Tactile cues, Verbal cues Education comprehension: verbalized understanding, returned demonstration, verbal cues required, tactile cues required, and needs further education    HOME EXERCISE PROGRAM: ***  ASSESSMENT:  CLINICAL IMPRESSION: Patient is a 78 y.o. woman who was seen today for physical therapy evaluation and treatment for ***.   OBJECTIVE IMPAIRMENTS: {opptimpairments:25111}.   ACTIVITY LIMITATIONS: {activitylimitations:27494}  PARTICIPATION LIMITATIONS: {participationrestrictions:25113}  PERSONAL FACTORS: Age, Time since onset of injury/illness/exacerbation, and 3+ comorbidities: anxiety, asthma, DM, HTN, MS, TIA/CVA are also affecting patient's functional outcome.   REHAB POTENTIAL: {rehabpotential:25112}  CLINICAL DECISION MAKING: {clinical decision making:25114}  EVALUATION COMPLEXITY: {Evaluation  complexity:25115}   GOALS: Goals reviewed with patient? {yes/no:20286}  SHORT TERM GOALS: Target date: ***  Pt will demonstrate appropriate understanding and performance of initially prescribed HEP in order to facilitate improved independence with management of symptoms.   Baseline: HEP ***  Goal status: INITIAL   2. Pt will report at least 25% improvement in overall pain levels over past week in order to facilitate improved tolerance to typical daily activities.   Baseline: ***  Goal status: INITIAL    LONG TERM GOALS: Target date: ***   1. ***   PLAN:  PT FREQUENCY: {rehab frequency:25116}  PT DURATION: {rehab duration:25117}  PLANNED INTERVENTIONS: {rehab planned interventions:25118::97110-Therapeutic exercises,97530- Therapeutic [phone]- Neuromuscular re-education,97535- Self Rjmz,02859- Manual therapy,Patient/Family education}.  PLAN FOR NEXT SESSION: Review/update HEP PRN. Work on [employer] exercises as appropriate with emphasis on ***. Symptom modification strategies as indicated/appropriate.    Alm DELENA Jenny PT, DPT 12/10/2023 4:41 PM

## 2023-12-11 ENCOUNTER — Ambulatory Visit: Admitting: Physical Therapy

## 2023-12-17 ENCOUNTER — Other Ambulatory Visit: Payer: Self-pay

## 2023-12-17 ENCOUNTER — Ambulatory Visit: Attending: Internal Medicine | Admitting: Physical Therapy

## 2023-12-17 ENCOUNTER — Encounter: Payer: Self-pay | Admitting: Physical Therapy

## 2023-12-17 DIAGNOSIS — R262 Difficulty in walking, not elsewhere classified: Secondary | ICD-10-CM | POA: Diagnosis not present

## 2023-12-17 DIAGNOSIS — M6281 Muscle weakness (generalized): Secondary | ICD-10-CM | POA: Diagnosis not present

## 2023-12-17 DIAGNOSIS — R2681 Unsteadiness on feet: Secondary | ICD-10-CM | POA: Diagnosis not present

## 2023-12-17 NOTE — Therapy (Signed)
 OUTPATIENT PHYSICAL THERAPY NEURO EVALUATION   Patient Name: Melinda Mckay MRN: [id number] DOB:[birth year], 78 y.o., female Today's Date: 12/17/2023   PCP: Roanna Piedmont REFERRING PROVIDER: Roanna Piedmont  END OF SESSION:  PT End of Session - 12/17/23 1540     Visit Number 1    Number of Visits 16    Authorization Type [employer] medicare    Authorization - Visit Number 1    Progress Note Due on Visit 10    PT Start Time 1445    PT Stop Time 1530    PT Time Calculation (min) 45 min    Activity Tolerance Patient tolerated treatment well    Behavior During Therapy WFL for tasks assessed/performed          Past Medical History:  Diagnosis Date   Anxiety    Asthma    Diabetes mellitus without complication (HCC)    Edema of lower extremity    Hyperlipidemia    Hypertension    Multiple sclerosis    Prediabetes    Restless legs    Past Surgical History:  Procedure Laterality Date   bone spur removal Right    BUNIONECTOMY Bilateral 2005   PARTIAL HYSTERECTOMY  2008   TUBAL LIGATION  1982   Patient Active Problem List   Diagnosis Date Noted   Cerebrovascular accident (CVA) due to occlusion of small artery (HCC) 10/30/2020   Numbness and tingling in right hand 10/30/2020   Acute stroke due to ischemia (HCC) 06/29/2020   Snoring 06/29/2020   Sleep apnea in adult 06/29/2020   Mild intermittent asthma 06/29/2020   Hypokalemia 04/24/2020   Lab test positive for detection of COVID-19 virus 04/24/2020   TIA (transient ischemic attack) 04/23/2020   Diverticulosis of colon 01/16/2018   Internal hemorrhoids 01/16/2018   Tubular adenoma 01/16/2018   Paresthesias 04/28/2017   Anxiety disorder 11/05/2016   Edema of lower extremity 11/05/2016   Essential hypertension 11/05/2016   Hyperlipidemia 11/05/2016   Prediabetes 11/05/2016   Restless legs 11/05/2016   Mild persistent asthma in adult without complication 12/12/2011    ONSET DATE: 2024  REFERRING DIAG:  Hemiplegia  THERAPY DIAG:  Muscle weakness (generalized)  Difficulty in walking, not elsewhere classified  Rationale for Evaluation and Treatment: Rehabilitation  SUBJECTIVE:                                                                                                                                                                                             SUBJECTIVE STATEMENT: Pt states she was diagnosed with MS 1 year ago. She does not see neurologist again until 03/2024. Pt worked  with physical therapy at this clinic until 11/2022 and was discharged with HEP. She also incorporated water aerobics and was doing that until April of this year. She states that since April she has been extremely tired and fatigued and is only able to complete 1 out of the house task a day due to fatigue. She saw PCP a few weeks ago who recommended she resume PT.  She uses a rollator at all times for ambulation. She states that Rt LE is painful up to the hip and feels more weak than Lt. She also has pain in Rt UE and her Rt hand is cold all the time and tingles.  Her biggest complaint is Rt sided weakness and balance Pt accompanied by: self  PERTINENT HISTORY: MS  PAIN:  Are you having pain? Yes: NPRS scale: 0/10 currently, up to 4/10 at worst Pain location: Rt LE and Rt UE Pain description: uncomfortable Aggravating factors: first thing in the morning, prolonged activity Relieving factors: rest  PRECAUTIONS: Fall and Other: MS  RED FLAGS: None   WEIGHT BEARING RESTRICTIONS: No  FALLS: Has patient fallen in last 6 months? Yes. Number of falls 3-4, none with injury  LIVING ENVIRONMENT: Lives with: lives alone Lives in: House/apartment Stairs: No Has following equipment at home: Walker - 4 wheeled  PLOF: Independent with household mobility with device  PATIENT GOALS: improve balance and Rt sided strength  OBJECTIVE:  Note: Objective measures were completed at Evaluation unless otherwise  noted.  DIAGNOSTIC FINDINGS: MRI 2024: Findings consistent with the clinical diagnosis of multiple sclerosis. Compared to prior MRI, there is interval progression of the T2 hyperintense lesions in the bilateral corona radiata and genu of the corpus callosum. No focus of abnormal contrast enhancement to suggest active demyelination.  COGNITION: Overall cognitive status: Within functional limits for tasks assessed     COORDINATION: WFL for RAMS UE and LE      LOWER EXTREMITY MMT:    MMT Right Eval Left Eval  Hip flexion 3- 4  Hip extension    Hip abduction    Hip adduction    Hip internal rotation    Hip external rotation    Knee flexion 3 4  Knee extension 3+ 4+  Ankle dorsiflexion 3 4  Ankle plantarflexion    Ankle inversion    Ankle eversion    (Blank rows = not tested)   GAIT: Findings: Gait Characteristics: wide BOS, Distance walked: 100', Assistive device utilized:3 wheeled rollator, and Comments: slow cadence  FUNCTIONAL TESTS:  30 second chair rise = 3 with UE support TUG : 28.91 seconds with rollator BERG BALANCE TEST Sitting to Standing: 3.      Stands independently using hands Standing Unsupported: 3.      Stands 2 minutes with supervision Sitting Unsupported: 4.     Sits for 2 minutes independently Standing to Sitting: 2.     Uses back of legs against chair to control descent  Transfers: 3.     Transfers safely definite use of hands Standing with eyes closed: 3.     Stands 10 seconds with supervision Standing with feet together: 1.     Needs help to attain position but can hold for 15 seconds Reaching forward with outstretched arm: 1.     Reaches forward with supervision Retrieving object from the floor: 0.     Unable/needs assistance to keep from falling Turning to look behind: 1.     Needs supervision when turning Turning 360 degrees: 0.  Needs assistance Place alternate foot on stool: 1.     Completes >2 steps with minimal assist Standing  with one foot in front: 0.     Loses balance while standing/stepping Standing on one foot: 0.     Unable  Total Score: 22/56                                                                                                                                TREATMENT DATE: 12/17/23 See HEP Pt educated on PT POC and goals, HEP, rationale for treatment    PATIENT EDUCATION: Education details: PT POC and goals, HEP Person educated: Patient Education method: Explanation, Demonstration, and Handouts Education comprehension: verbalized understanding and returned demonstration  HOME EXERCISE PROGRAM: Access Code: [email or web address] URL: [email or web address] Date: 12/17/2023 Prepared by: Darice Conine  Exercises - Seated March  - 1 x daily - 7 x weekly - 3 sets - 10 reps - Seated Long Arc Quad  - 1 x daily - 7 x weekly - 3 sets - 10 reps - Seated Hip Abduction with Resistance  - 1 x daily - 7 x weekly - 3 sets - 10 reps - Sit to Stand with Armchair  - 1 x daily - 7 x weekly - 3 sets - 5 reps  GOALS: Goals reviewed with patient? Yes  SHORT TERM GOALS: Target date: 01/14/2024    Pt will be independent with initial HEP Baseline: Goal status: INITIAL  2.  Pt will improve TUG to <= 24 seconds to demo improved functional mobility Baseline:  Goal status: INITIAL   LONG TERM GOALS: Target date: 02/11/2024    Pt will be independent with advanced HEP including plan for community exercise Baseline:  Goal status: INITIAL  2.  Pt will improve Berg balance test to >=30/56 to demo improving balance Baseline:  Goal status: INITIAL  3.  Pt will improve 30 second chair rise test to >= 6 with UE support Baseline:  Goal status: INITIAL  4.  Pt will improve Rt LE strength to 4/5 to improve functional mobility Baseline:  Goal status: INITIAL   ASSESSMENT:  CLINICAL IMPRESSION: Patient is a 78 y.o. female who was seen today for physical therapy evaluation and treatment for Rt  hemiplegia. Pt presents with impaired gait and balance, decreased Rt UE and LE strength, frequent falls, decreased functional activity tolerance. Pt will benefit from skilled PT to address deficits and improve balance and gait and decrease risk of falls.   OBJECTIVE IMPAIRMENTS: Abnormal gait, decreased activity tolerance, difficulty walking, decreased strength, and impaired UE functional use.   ACTIVITY LIMITATIONS: standing, squatting, transfers, bathing, dressing, self feeding, and locomotion level  PARTICIPATION LIMITATIONS: meal prep, cleaning, driving, and community activity  PERSONAL FACTORS: 1-2 comorbidities: history of TIA, possible MS are also affecting patient's functional outcome.   REHAB POTENTIAL: Good  CLINICAL DECISION MAKING: Evolving/moderate complexity  EVALUATION COMPLEXITY: Moderate  PLAN:  PT FREQUENCY: 2x/week  PT  DURATION: 8 weeks  PLANNED INTERVENTIONS: 97164- PT Re-evaluation, 97110-Therapeutic exercises, 97530- Therapeutic activity, [id number]- Neuromuscular re-education, 97535- Self Care, 02859- Manual therapy, [id number]- Gait training, [phone]- Aquatic Therapy, Patient/Family education, Cryotherapy, and Moist heat  PLAN FOR NEXT SESSION: Gait and balance training   Micael Barb, PT 12/17/2023, 3:41 PM

## 2023-12-24 DIAGNOSIS — R269 Unspecified abnormalities of gait and mobility: Secondary | ICD-10-CM | POA: Diagnosis not present

## 2023-12-24 DIAGNOSIS — G959 Disease of spinal cord, unspecified: Secondary | ICD-10-CM | POA: Diagnosis not present

## 2023-12-24 DIAGNOSIS — R9089 Other abnormal findings on diagnostic imaging of central nervous system: Secondary | ICD-10-CM | POA: Diagnosis not present

## 2023-12-24 DIAGNOSIS — R29898 Other symptoms and signs involving the musculoskeletal system: Secondary | ICD-10-CM | POA: Diagnosis not present

## 2023-12-26 ENCOUNTER — Ambulatory Visit: Admitting: Physical Therapy

## 2023-12-26 ENCOUNTER — Encounter: Payer: Self-pay | Admitting: Physical Therapy

## 2023-12-26 DIAGNOSIS — R262 Difficulty in walking, not elsewhere classified: Secondary | ICD-10-CM | POA: Diagnosis not present

## 2023-12-26 DIAGNOSIS — M6281 Muscle weakness (generalized): Secondary | ICD-10-CM | POA: Diagnosis not present

## 2023-12-26 DIAGNOSIS — R2681 Unsteadiness on feet: Secondary | ICD-10-CM | POA: Diagnosis not present

## 2023-12-26 NOTE — Therapy (Signed)
 OUTPATIENT PHYSICAL THERAPY TREATMENT   Patient Name: Melinda Mckay MRN: [id number] DOB:[birth year], 78 y.o., female Today's Date: 12/26/2023   PCP: Roanna Piedmont REFERRING PROVIDER: Roanna Piedmont Referring diagnosis: G81.91 (ICD-10-CM) - Hemiplegia, unspecified affecting right dominant side   END OF SESSION:  PT End of Session - 12/26/23 0931     Visit Number 2    Number of Visits 16    Authorization Type BCBS medicare    Progress Note Due on Visit 10    PT Start Time 0933    PT Stop Time 1015    PT Time Calculation (min) 42 min           Past Medical History:  Diagnosis Date   Anxiety    Asthma    Diabetes mellitus without complication (HCC)    Edema of lower extremity    Hyperlipidemia    Hypertension    Multiple sclerosis    Prediabetes    Restless legs    Past Surgical History:  Procedure Laterality Date   bone spur removal Right    BUNIONECTOMY Bilateral 2005   PARTIAL HYSTERECTOMY  2008   TUBAL LIGATION  1982   Patient Active Problem List   Diagnosis Date Noted   Cerebrovascular accident (CVA) due to occlusion of small artery (HCC) 10/30/2020   Numbness and tingling in right hand 10/30/2020   Acute stroke due to ischemia (HCC) 06/29/2020   Snoring 06/29/2020   Sleep apnea in adult 06/29/2020   Mild intermittent asthma 06/29/2020   Hypokalemia 04/24/2020   Lab test positive for detection of COVID-19 virus 04/24/2020   TIA (transient ischemic attack) 04/23/2020   Diverticulosis of colon 01/16/2018   Internal hemorrhoids 01/16/2018   Tubular adenoma 01/16/2018   Paresthesias 04/28/2017   Anxiety disorder 11/05/2016   Edema of lower extremity 11/05/2016   Essential hypertension 11/05/2016   Hyperlipidemia 11/05/2016   Prediabetes 11/05/2016   Restless legs 11/05/2016   Mild persistent asthma in adult without complication 12/12/2011    ONSET DATE: 2024  REFERRING DIAG: Hemiplegia  THERAPY DIAG:  Muscle weakness  (generalized)  Difficulty in walking, not elsewhere classified  Rationale for Evaluation and Treatment: Rehabilitation  SUBJECTIVE:  Per eval: Pt states she was diagnosed with MS 1 year ago. She does not see neurologist again until 03/2024. Pt worked with physical therapy at this clinic until 11/2022 and was discharged with HEP. She also incorporated water aerobics and was doing that until April of this year. She states that since April she has been extremely tired and fatigued and is only able to complete 1 out of the house task a day due to fatigue. She saw PCP a few weeks ago who recommended she resume PT.  She uses a rollator at all times for ambulation. She states that Rt LE is painful up to the hip and feels more weak than Lt. She also has pain in Rt UE and her Rt hand is cold all the time and tingles.  Her biggest complaint is Rt sided weakness and balance Pt accompanied by: self  SUBJECTIVE STATEMENT: 12/26/2023: a bit fatigued today, about the same as usual. Felt okay after initial eval. Saw [facility] physician and they are planning to do more work up (MRI), encouraged to continue w/ PT per pt report. Mild knee pain today she attributes to weather but overall doing well.   PERTINENT HISTORY: MS  PAIN:  Are you having pain? Yes: NPRS scale: 0/10 currently, up to 4/10 at worst Pain location: Rt LE and Rt UE Pain description: uncomfortable Aggravating factors: first thing in the morning, prolonged activity Relieving factors: rest  PRECAUTIONS: Fall and Other: MS  RED FLAGS: None   WEIGHT BEARING RESTRICTIONS: No  FALLS: Has patient fallen in last 6 months? Yes. Number of falls 3-4, none with injury  LIVING ENVIRONMENT: Lives with: lives alone Lives in: House/apartment Stairs: No Has following equipment at home: Walker - 4  wheeled  PLOF: Independent with household mobility with device  PATIENT GOALS: improve balance and Rt sided strength  OBJECTIVE:  Note: Objective measures were completed at Evaluation unless otherwise noted.  DIAGNOSTIC FINDINGS: MRI 2024: Findings consistent with the clinical diagnosis of multiple sclerosis. Compared to prior MRI, there is interval progression of the T2 hyperintense lesions in the bilateral corona radiata and genu of the corpus callosum. No focus of abnormal contrast enhancement to suggest active demyelination.  COGNITION: Overall cognitive status: Within functional limits for tasks assessed     COORDINATION: WFL for RAMS UE and LE      LOWER EXTREMITY MMT:    MMT Right Eval Left Eval  Hip flexion 3- 4  Hip extension    Hip abduction    Hip adduction    Hip internal rotation    Hip external rotation    Knee flexion 3 4  Knee extension 3+ 4+  Ankle dorsiflexion 3 4  Ankle plantarflexion    Ankle inversion    Ankle eversion    (Blank rows = not tested)   GAIT: Findings: Gait Characteristics: wide BOS, Distance walked: 100', Assistive device utilized:3 wheeled rollator, and Comments: slow cadence  FUNCTIONAL TESTS:  30 second chair rise = 3 with UE support TUG : 28.91 seconds with rollator BERG BALANCE TEST Sitting to Standing: 3.      Stands independently using hands Standing Unsupported: 3.      Stands 2 minutes with supervision Sitting Unsupported: 4.     Sits for 2 minutes independently Standing to Sitting: 2.     Uses back of legs against chair to control descent  Transfers: 3.     Transfers safely definite use of hands Standing with eyes closed: 3.     Stands 10 seconds with supervision Standing with feet together: 1.     Needs help to attain position but can hold for 15 seconds Reaching forward with outstretched arm: 1.     Reaches forward with supervision Retrieving object from the floor: 0.     Unable/needs assistance to keep from  falling Turning to look behind: 1.     Needs supervision when turning Turning 360 degrees: 0.     Needs assistance Place alternate foot on stool: 1.     Completes >2 steps with minimal assist Standing with one foot in front: 0.     Loses balance while standing/stepping Standing on one foot: 0.     Unable  Total Score: 22/56  TREATMENT:  [facility] Adult PT Treatment:                                                DATE: 12/26/23 Therapeutic Exercise: Seated march x10 BIL  AAROM march x8 RLE Seated LAQ x10 BIL  Strap assisted LAQ x8 RLE Seated hip abduction RB x10 HEP discussion/education  Neuromuscular re-ed: Standing fwd/retro stepping x12 BIL UE support unilat  Standing fwd/retro stepping over DB 2x8   Self Care: Education/discussion re: symptom behavior, pt goals in regards to functional mobility (walking, standing balance, car transfers)    12/17/23 See HEP Pt educated on PT POC and goals, HEP, rationale for treatment    PATIENT EDUCATION: Education details: PT POC and goals, HEP Person educated: Patient Education method: Explanation, Demonstration, and Handouts Education comprehension: verbalized understanding and returned demonstration  HOME EXERCISE PROGRAM: Access Code: [email or web address] URL: [email or web address] Date: 12/17/2023 Prepared by: Darice Conine  Exercises - Seated March  - 1 x daily - 7 x weekly - 3 sets - 10 reps - Seated Long Arc Quad  - 1 x daily - 7 x weekly - 3 sets - 10 reps - Seated Hip Abduction with Resistance  - 1 x daily - 7 x weekly - 3 sets - 10 reps - Sit to Stand with Armchair  - 1 x daily - 7 x weekly - 3 sets - 5 reps  GOALS: Goals reviewed with patient? Yes  SHORT TERM GOALS: Target date: 01/14/2024    Pt will be independent with initial HEP Baseline: Goal status: INITIAL  2.  Pt will improve  TUG to <= 24 seconds to demo improved functional mobility Baseline:  Goal status: INITIAL   LONG TERM GOALS: Target date: 02/11/2024    Pt will be independent with advanced HEP including plan for community exercise Baseline:  Goal status: INITIAL  2.  Pt will improve Berg balance test to >=30/56 to demo improving balance Baseline:  Goal status: INITIAL  3.  Pt will improve 30 second chair rise test to >= 6 with UE support Baseline:  Goal status: INITIAL  4.  Pt will improve Rt LE strength to 4/5 to improve functional mobility Baseline:  Goal status: INITIAL   ASSESSMENT:  CLINICAL IMPRESSION: 12/26/2023: Pt arrives w/ baseline fatigue, no issues after last session. Noted significant heaviness with AROM tasks during HEP so we do trial AAROM modifications which she does quite well with. Also working on fwd/retro stepping for closed chain stability BIL, progression for DB stepover to work on open chain stability in sagittal plane. No adverse events, tolerates well with tendency for improved performance w/ repetition. Notes less fatigue on departure compared to arrival. Recommend continuing along current POC in order to address relevant deficits and improve functional tolerance. Pt departs today's session in no acute distress, all voiced questions/concerns addressed appropriately from PT perspective.     OBJECTIVE IMPAIRMENTS: Abnormal gait, decreased activity tolerance, difficulty walking, decreased strength, and impaired UE functional use.   ACTIVITY LIMITATIONS: standing, squatting, transfers, bathing, dressing, self feeding, and locomotion level  PARTICIPATION LIMITATIONS: meal prep, cleaning, driving, and community activity  PERSONAL FACTORS: 1-2 comorbidities: history of TIA, possible MS are also affecting patient's functional outcome.   REHAB POTENTIAL: Good  CLINICAL DECISION MAKING: Evolving/moderate complexity  EVALUATION COMPLEXITY: Moderate  PLAN:  PT FREQUENCY:  2x/week  PT DURATION: 8  weeks  PLANNED INTERVENTIONS: 97164- PT Re-evaluation, 97110-Therapeutic exercises, 97530- Therapeutic activity, [id number]- Neuromuscular re-education, 97535- Self Care, 02859- Manual therapy, [phone]- Gait training, [phone]- Aquatic Therapy, Patient/Family education, Cryotherapy, and Moist heat  PLAN FOR NEXT SESSION: Gait and balance training   Alm DELENA Jenny PT, DPT 12/26/2023 10:40 AM

## 2023-12-30 DIAGNOSIS — G959 Disease of spinal cord, unspecified: Secondary | ICD-10-CM | POA: Diagnosis not present

## 2023-12-30 DIAGNOSIS — M4802 Spinal stenosis, cervical region: Secondary | ICD-10-CM | POA: Diagnosis not present

## 2023-12-30 DIAGNOSIS — M4316 Spondylolisthesis, lumbar region: Secondary | ICD-10-CM | POA: Diagnosis not present

## 2023-12-30 DIAGNOSIS — M5030 Other cervical disc degeneration, unspecified cervical region: Secondary | ICD-10-CM | POA: Diagnosis not present

## 2023-12-30 DIAGNOSIS — M4313 Spondylolisthesis, cervicothoracic region: Secondary | ICD-10-CM | POA: Diagnosis not present

## 2023-12-30 DIAGNOSIS — M5134 Other intervertebral disc degeneration, thoracic region: Secondary | ICD-10-CM | POA: Diagnosis not present

## 2023-12-30 DIAGNOSIS — R292 Abnormal reflex: Secondary | ICD-10-CM | POA: Diagnosis not present

## 2023-12-30 DIAGNOSIS — M21061 Valgus deformity, not elsewhere classified, right knee: Secondary | ICD-10-CM | POA: Diagnosis not present

## 2023-12-31 ENCOUNTER — Ambulatory Visit: Admitting: Physical Therapy

## 2023-12-31 ENCOUNTER — Encounter: Payer: Self-pay | Admitting: Physical Therapy

## 2023-12-31 DIAGNOSIS — R2681 Unsteadiness on feet: Secondary | ICD-10-CM | POA: Diagnosis not present

## 2023-12-31 DIAGNOSIS — R262 Difficulty in walking, not elsewhere classified: Secondary | ICD-10-CM

## 2023-12-31 DIAGNOSIS — M6281 Muscle weakness (generalized): Secondary | ICD-10-CM

## 2023-12-31 NOTE — Therapy (Signed)
 OUTPATIENT PHYSICAL THERAPY TREATMENT   Patient Name: Melinda Mckay MRN: [id number] DOB:01-Nov-1945, 78 y.o., female Today's Date: 12/31/2023   PCP: Roanna Piedmont REFERRING PROVIDER: Roanna Piedmont Referring diagnosis: G81.91 (ICD-10-CM) - Hemiplegia, unspecified affecting right dominant side   END OF SESSION:  PT End of Session - 12/31/23 1017     Visit Number 3    Number of Visits 16    Authorization Type BCBS medicare    Progress Note Due on Visit 10    PT Start Time 1018    PT Stop Time 1058    PT Time Calculation (min) 40 min            Past Medical History:  Diagnosis Date   Anxiety    Asthma    Diabetes mellitus without complication (HCC)    Edema of lower extremity    Hyperlipidemia    Hypertension    Multiple sclerosis    Prediabetes    Restless legs    Past Surgical History:  Procedure Laterality Date   bone spur removal Right    BUNIONECTOMY Bilateral 2005   PARTIAL HYSTERECTOMY  2008   TUBAL LIGATION  1982   Patient Active Problem List   Diagnosis Date Noted   Cerebrovascular accident (CVA) due to occlusion of small artery (HCC) 10/30/2020   Numbness and tingling in right hand 10/30/2020   Acute stroke due to ischemia (HCC) 06/29/2020   Snoring 06/29/2020   Sleep apnea in adult 06/29/2020   Mild intermittent asthma 06/29/2020   Hypokalemia 04/24/2020   Lab test positive for detection of COVID-19 virus 04/24/2020   TIA (transient ischemic attack) 04/23/2020   Diverticulosis of colon 01/16/2018   Internal hemorrhoids 01/16/2018   Tubular adenoma 01/16/2018   Paresthesias 04/28/2017   Anxiety disorder 11/05/2016   Edema of lower extremity 11/05/2016   Essential hypertension 11/05/2016   Hyperlipidemia 11/05/2016   Prediabetes 11/05/2016   Restless legs 11/05/2016   Mild persistent asthma in adult without complication 12/12/2011    ONSET DATE: 2024  REFERRING DIAG: Hemiplegia  THERAPY DIAG:  Muscle weakness  (generalized)  Difficulty in walking, not elsewhere classified  Rationale for Evaluation and Treatment: Rehabilitation  SUBJECTIVE:  Per eval: Pt states she was diagnosed with MS 1 year ago. She does not see neurologist again until 03/2024. Pt worked with physical therapy at this clinic until 11/2022 and was discharged with HEP. She also incorporated water aerobics and was doing that until April of this year. She states that since April she has been extremely tired and fatigued and is only able to complete 1 out of the house task a day due to fatigue. She saw PCP a few weeks ago who recommended she resume PT.  She uses a rollator at all times for ambulation. She states that Rt LE is painful up to the hip and feels more weak than Lt. She also has pain in Rt UE and her Rt hand is cold all the time and tingles.  Her biggest complaint is Rt sided weakness and balance Pt accompanied by: self  SUBJECTIVE STATEMENT: 12/31/2023: states she saw provider at [facility] yesterday and they went through imaging of spine. Is going back Sunday for an MRI and following up with neurosurgeon on Nov 17th. Voices strong motivation to improve functional status. No issues after last session.    PERTINENT HISTORY: MS  PAIN:  Are you having pain? Yes: NPRS scale: 0/10 currently, up to 4/10 at worst Pain location: Rt LE and Rt UE Pain description: uncomfortable Aggravating factors: first thing in the morning, prolonged activity Relieving factors: rest  PRECAUTIONS: Fall and Other: MS  RED FLAGS: None   WEIGHT BEARING RESTRICTIONS: No  FALLS: Has patient fallen in last 6 months? Yes. Number of falls 3-4, none with injury  LIVING ENVIRONMENT: Lives with: lives alone Lives in: House/apartment Stairs: No Has following equipment at home: Walker - 4  wheeled  PLOF: Independent with household mobility with device  PATIENT GOALS: improve balance and Rt sided strength  OBJECTIVE:  Note: Objective measures were completed at Evaluation unless otherwise noted.  DIAGNOSTIC FINDINGS: MRI 2024: Findings consistent with the clinical diagnosis of multiple sclerosis. Compared to prior MRI, there is interval progression of the T2 hyperintense lesions in the bilateral corona radiata and genu of the corpus callosum. No focus of abnormal contrast enhancement to suggest active demyelination.  COGNITION: Overall cognitive status: Within functional limits for tasks assessed     COORDINATION: WFL for RAMS UE and LE    LOWER EXTREMITY MMT:    MMT Right Eval Left Eval  Hip flexion 3- 4  Hip extension    Hip abduction    Hip adduction    Hip internal rotation    Hip external rotation    Knee flexion 3 4  Knee extension 3+ 4+  Ankle dorsiflexion 3 4  Ankle plantarflexion    Ankle inversion    Ankle eversion    (Blank rows = not tested)   GAIT: Findings: Gait Characteristics: wide BOS, Distance walked: 100', Assistive device utilized:3 wheeled rollator, and Comments: slow cadence  FUNCTIONAL TESTS:  30 second chair rise = 3 with UE support TUG : 28.91 seconds with rollator BERG BALANCE TEST Sitting to Standing: 3.      Stands independently using hands Standing Unsupported: 3.      Stands 2 minutes with supervision Sitting Unsupported: 4.     Sits for 2 minutes independently Standing to Sitting: 2.     Uses back of legs against chair to control descent  Transfers: 3.     Transfers safely definite use of hands Standing with eyes closed: 3.     Stands 10 seconds with supervision Standing with feet together: 1.     Needs help to attain position but can hold for 15 seconds Reaching forward with outstretched arm: 1.     Reaches forward with supervision Retrieving object from the floor: 0.     Unable/needs assistance to keep from  falling Turning to look behind: 1.     Needs supervision when turning Turning 360 degrees: 0.     Needs assistance Place alternate foot on stool: 1.     Completes >2 steps with minimal assist Standing with one foot in front: 0.     Loses balance while standing/stepping Standing on one foot: 0.     Unable  Total Score: 22/56  TREATMENT:   [facility] Adult PT Treatment:                                                DATE: 12/31/23  Neuromuscular re-ed: Standing yoga block taps w/ UE support 2x10 BIL alt tactile cues for quad/hamstring co contraction in RLE stance Standing hip ext x8 BIL (tactile facilitation at R knee during stance) Standing hip ext x8 BIL tactile facilitation at quad   Therapeutic Activity: STS from raised mat 3x6 cues for weight shifting, guarding at knee but no blocking required  2 inch step up w/ tactile cues for knee mechanics; 2x10 RLE w UE support  Self Care: Education/discussion re: activity modification, pacing of tasks, breaking up HEP throughout day PRN, safety w/ standing activities and modification as indicated    [facility] Adult PT Treatment:                                                DATE: 12/26/23 Therapeutic Exercise: Seated march x10 BIL  AAROM march x8 RLE Seated LAQ x10 BIL  Strap assisted LAQ x8 RLE Seated hip abduction RB x10 HEP discussion/education  Neuromuscular re-ed: Standing fwd/retro stepping x12 BIL UE support unilat  Standing fwd/retro stepping over DB 2x8   Self Care: Education/discussion re: symptom behavior, pt goals in regards to functional mobility (walking, standing balance, car transfers)    12/17/23 See HEP Pt educated on PT POC and goals, HEP, rationale for treatment    PATIENT EDUCATION: Education details: PT POC and goals, HEP Person educated: Patient Education method: Explanation,  Demonstration, and Handouts Education comprehension: verbalized understanding and returned demonstration  HOME EXERCISE PROGRAM: Access Code: [email or web address] URL: [email or web address] Date: 12/31/2023 Prepared by: Alm Jenny  Exercises - Seated March  - 1 x daily - 7 x weekly - 3 sets - 10 reps - Seated Long Arc Quad  - 1 x daily - 7 x weekly - 3 sets - 10 reps - Seated Hip Abduction with Resistance  - 1 x daily - 7 x weekly - 3 sets - 10 reps - Sit to Stand with Armchair  - 1 x daily - 7 x weekly - 3 sets - 5 reps - Standing March with Counter Support  - 2-3 x daily - 7 x weekly - 1 sets - 5 reps  GOALS: Goals reviewed with patient? Yes  SHORT TERM GOALS: Target date: 01/14/2024    Pt will be independent with initial HEP Baseline: Goal status: INITIAL  2.  Pt will improve TUG to <= 24 seconds to demo improved functional mobility Baseline:  Goal status: INITIAL   LONG TERM GOALS: Target date: 02/11/2024    Pt will be independent with advanced HEP including plan for community exercise Baseline:  Goal status: INITIAL  2.  Pt will improve Berg balance test to >=30/56 to demo improving balance Baseline:  Goal status: INITIAL  3.  Pt will improve 30 second chair rise test to >= 6 with UE support Baseline:  Goal status: INITIAL  4.  Pt will improve Rt LE strength to 4/5 to improve functional mobility Baseline:  Goal status: INITIAL   ASSESSMENT:  CLINICAL IMPRESSION: 12/31/2023: Pt arrives w/ baseline symptoms, no issues after last  session. Today focusing on more WB activities - blocked practice for transfers, 2 inch step. Tolerates well, some tactile cueing/facilitation at R knee during WB tasks but no overt instability noted. Rest breaks PRN but majority taken in standing w/ UE support. HEP update to include march taps given good response and improved performance w/ repetition in clinic. No adverse events, tolerates session well. Recommend continuing along  current POC in order to address relevant deficits and improve functional tolerance. Pt departs today's session in no acute distress, all voiced questions/concerns addressed appropriately from PT perspective.     OBJECTIVE IMPAIRMENTS: Abnormal gait, decreased activity tolerance, difficulty walking, decreased strength, and impaired UE functional use.   ACTIVITY LIMITATIONS: standing, squatting, transfers, bathing, dressing, self feeding, and locomotion level  PARTICIPATION LIMITATIONS: meal prep, cleaning, driving, and community activity  PERSONAL FACTORS: 1-2 comorbidities: history of TIA, possible MS are also affecting patient's functional outcome.   REHAB POTENTIAL: Good  CLINICAL DECISION MAKING: Evolving/moderate complexity  EVALUATION COMPLEXITY: Moderate  PLAN:  PT FREQUENCY: 2x/week  PT DURATION: 8 weeks  PLANNED INTERVENTIONS: 97164- PT Re-evaluation, 97110-Therapeutic exercises, 97530- Therapeutic activity, [id number]- Neuromuscular re-education, 97535- Self Care, 02859- Manual therapy, [id number]- Gait training, [phone]- Aquatic Therapy, Patient/Family education, Cryotherapy, and Moist heat  PLAN FOR NEXT SESSION: Gait and balance training   Alm DELENA Jenny PT, DPT 12/31/2023 12:05 PM

## 2024-01-01 NOTE — Therapy (Signed)
 OUTPATIENT PHYSICAL THERAPY TREATMENT   Patient Name: Melinda Mckay MRN: [id number] DOB:09-15-1945, 78 y.o., female Today's Date: 01/02/2024   PCP: Roanna Piedmont REFERRING PROVIDER: Roanna Piedmont Referring diagnosis: G81.91 (ICD-10-CM) - Hemiplegia, unspecified affecting right dominant side   END OF SESSION:  PT End of Session - 01/02/24 0801     Visit Number 4    Number of Visits 16    Date for Recertification  02/12/24    Authorization Type [employer] medicare    Progress Note Due on Visit 10    PT Start Time 0802    PT Stop Time 0841    PT Time Calculation (min) 39 min             Past Medical History:  Diagnosis Date   Anxiety    Asthma    Diabetes mellitus without complication (HCC)    Edema of lower extremity    Hyperlipidemia    Hypertension    Multiple sclerosis    Prediabetes    Restless legs    Past Surgical History:  Procedure Laterality Date   bone spur removal Right    BUNIONECTOMY Bilateral 2005   PARTIAL HYSTERECTOMY  2008   TUBAL LIGATION  1982   Patient Active Problem List   Diagnosis Date Noted   Cerebrovascular accident (CVA) due to occlusion of small artery (HCC) 10/30/2020   Numbness and tingling in right hand 10/30/2020   Acute stroke due to ischemia (HCC) 06/29/2020   Snoring 06/29/2020   Sleep apnea in adult 06/29/2020   Mild intermittent asthma 06/29/2020   Hypokalemia 04/24/2020   Lab test positive for detection of COVID-19 virus 04/24/2020   TIA (transient ischemic attack) 04/23/2020   Diverticulosis of colon 01/16/2018   Internal hemorrhoids 01/16/2018   Tubular adenoma 01/16/2018   Paresthesias 04/28/2017   Anxiety disorder 11/05/2016   Edema of lower extremity 11/05/2016   Essential hypertension 11/05/2016   Hyperlipidemia 11/05/2016   Prediabetes 11/05/2016   Restless legs 11/05/2016   Mild persistent asthma in adult without complication 12/12/2011    ONSET DATE: 2024  REFERRING DIAG: Hemiplegia  THERAPY  DIAG:  Muscle weakness (generalized)  Difficulty in walking, not elsewhere classified  Rationale for Evaluation and Treatment: Rehabilitation  SUBJECTIVE:  Per eval: Pt states she was diagnosed with MS 1 year ago. She does not see neurologist again until 03/2024. Pt worked with physical therapy at this clinic until 11/2022 and was discharged with HEP. She also incorporated water aerobics and was doing that until April of this year. She states that since April she has been extremely tired and fatigued and is only able to complete 1 out of the house task a day due to fatigue. She saw PCP a few weeks ago who recommended she resume PT.  She uses a rollator at all times for ambulation. She states that Rt LE is painful up to the hip and feels more weak than Lt. She also has pain in Rt UE and her Rt hand is cold all the time and tingles.  Her biggest complaint is Rt sided weakness and balance Pt accompanied by: self  SUBJECTIVE STATEMENT: 01/02/2024: felt good after last session, was able to be pretty active remainder of day. Has done well with HEP. No other new updates      PERTINENT HISTORY: MS  PAIN:  Are you having pain? Yes: NPRS scale: 0/10 currently, up to 4/10 at worst Pain location: Rt LE and Rt UE Pain description: uncomfortable Aggravating factors: first thing in the morning, prolonged activity Relieving factors: rest  PRECAUTIONS: Fall and Other: MS  RED FLAGS: None   WEIGHT BEARING RESTRICTIONS: No  FALLS: Has patient fallen in last 6 months? Yes. Number of falls 3-4, none with injury  LIVING ENVIRONMENT: Lives with: lives alone Lives in: House/apartment Stairs: No Has following equipment at home: Walker - 4 wheeled  PLOF: Independent with household mobility with device  PATIENT GOALS: improve balance and Rt  sided strength  OBJECTIVE:  Note: Objective measures were completed at Evaluation unless otherwise noted.  DIAGNOSTIC FINDINGS: MRI 2024: Findings consistent with the clinical diagnosis of multiple sclerosis. Compared to prior MRI, there is interval progression of the T2 hyperintense lesions in the bilateral corona radiata and genu of the corpus callosum. No focus of abnormal contrast enhancement to suggest active demyelination.  COGNITION: Overall cognitive status: Within functional limits for tasks assessed     COORDINATION: WFL for RAMS UE and LE    LOWER EXTREMITY MMT:    MMT Right Eval Left Eval  Hip flexion 3- 4  Hip extension    Hip abduction    Hip adduction    Hip internal rotation    Hip external rotation    Knee flexion 3 4  Knee extension 3+ 4+  Ankle dorsiflexion 3 4  Ankle plantarflexion    Ankle inversion    Ankle eversion    (Blank rows = not tested)   GAIT: Findings: Gait Characteristics: wide BOS, Distance walked: 100', Assistive device utilized:3 wheeled rollator, and Comments: slow cadence  FUNCTIONAL TESTS:  30 second chair rise = 3 with UE support TUG : 28.91 seconds with rollator BERG BALANCE TEST Sitting to Standing: 3.      Stands independently using hands Standing Unsupported: 3.      Stands 2 minutes with supervision Sitting Unsupported: 4.     Sits for 2 minutes independently Standing to Sitting: 2.     Uses back of legs against chair to control descent  Transfers: 3.     Transfers safely definite use of hands Standing with eyes closed: 3.     Stands 10 seconds with supervision Standing with feet together: 1.     Needs help to attain position but can hold for 15 seconds Reaching forward with outstretched arm: 1.     Reaches forward with supervision Retrieving object from the floor: 0.     Unable/needs assistance to keep from falling Turning to look behind: 1.     Needs supervision when turning Turning 360 degrees: 0.     Needs  assistance Place alternate foot on stool: 1.     Completes >2 steps with minimal assist Standing with one foot in front: 0.     Loses balance while standing/stepping Standing on one foot: 0.     Unable  Total Score: 22/56  TREATMENT:    [facility] Adult PT Treatment:                                                DATE: 01/02/24  Neuromuscular re-ed: Red  light / green light 1 lap CW/CCW each Theraband foam step up 2x12 w UE support  SLS RLE w/ UE support, 3x30sec tactile cues at quad/hamstring   Therapeutic Activity: Lateral stepping at treadmill 2 x 3 laps w UE support cues for clearance and posture Mini squat at treadmill w/ UE support 2x5 cues for comfortable ROM     [facility] Adult PT Treatment:                                                DATE: 12/31/23  Neuromuscular re-ed: Standing yoga block taps w/ UE support 2x10 BIL alt tactile cues for quad/hamstring co contraction in RLE stance Standing hip ext x8 BIL (tactile facilitation at R knee during stance) Standing hip ext x8 BIL tactile facilitation at quad   Therapeutic Activity: STS from raised mat 3x6 cues for weight shifting, guarding at knee but no blocking required  2 inch step up w/ tactile cues for knee mechanics; 2x10 RLE w UE support  Self Care: Education/discussion re: activity modification, pacing of tasks, breaking up HEP throughout day PRN, safety w/ standing activities and modification as indicated    [facility] Adult PT Treatment:                                                DATE: 12/26/23 Therapeutic Exercise: Seated march x10 BIL  AAROM march x8 RLE Seated LAQ x10 BIL  Strap assisted LAQ x8 RLE Seated hip abduction RB x10 HEP discussion/education  Neuromuscular re-ed: Standing fwd/retro stepping x12 BIL UE support unilat  Standing fwd/retro stepping over DB 2x8   Self  Care: Education/discussion re: symptom behavior, pt goals in regards to functional mobility (walking, standing balance, car transfers)    PATIENT EDUCATION: Education details: rationale for interventions, HEP  Person educated: Patient Education method: Explanation, Demonstration, Tactile cues, Verbal cues Education comprehension: verbalized understanding, returned demonstration, verbal cues required, tactile cues required, and needs further education     HOME EXERCISE PROGRAM: Access Code: [email or web address] URL: [email or web address] Date: 01/02/2024 Prepared by: Alm Jenny  Exercises - Seated March  - 1 x daily - 7 x weekly - 3 sets - 10 reps - Seated Long Arc Quad  - 1 x daily - 7 x weekly - 3 sets - 10 reps - Seated Hip Abduction with Resistance  - 1 x daily - 7 x weekly - 3 sets - 10 reps - Sit to Stand with Armchair  - 1 x daily - 7 x weekly - 3 sets - 5 reps - Standing March with Counter Support  - 2-3 x daily - 7 x weekly - 1 sets - 5 reps - Side Stepping with Counter Support  - 2-3 x daily - 7 x weekly - 1 sets - 10 reps  GOALS: Goals reviewed with patient? Yes  SHORT TERM GOALS: Target date: 01/14/2024  Pt will be independent with initial HEP Baseline: Goal status: INITIAL  2.  Pt will improve TUG to <= 24 seconds to demo improved functional mobility Baseline:  Goal status: INITIAL   LONG TERM GOALS: Target date: 02/11/2024    Pt will be independent with advanced HEP including plan for community exercise Baseline:  Goal status: INITIAL  2.  Pt will improve Berg balance test to >=30/56 to demo improving balance Baseline:  Goal status: INITIAL  3.  Pt will improve 30 second chair rise test to >= 6 with UE support Baseline:  Goal status: INITIAL  4.  Pt will improve Rt LE strength to 4/5 to improve functional mobility Baseline:  Goal status: INITIAL   ASSESSMENT:  CLINICAL IMPRESSION: 01/02/2024: Pt arrives w/ report of good response to  last session. Today we spend increased time w/ postural stability work emphasizing quad/hamstring co contraction on RLE in stance. Inc time to complete given rest breaks and time to ensure appropriate setup. Also working on mini squats and lateral stepping for functional LE strength. Tolerates session well, rest breaks PRN, no episodes of buckling/instability. No adverse events. Recommend continuing along current POC in order to address relevant deficits and improve functional tolerance. Pt departs today's session in no acute distress, all voiced questions/concerns addressed appropriately from PT perspective.    OBJECTIVE IMPAIRMENTS: Abnormal gait, decreased activity tolerance, difficulty walking, decreased strength, and impaired UE functional use.   ACTIVITY LIMITATIONS: standing, squatting, transfers, bathing, dressing, self feeding, and locomotion level  PARTICIPATION LIMITATIONS: meal prep, cleaning, driving, and community activity  PERSONAL FACTORS: 1-2 comorbidities: history of TIA, possible MS are also affecting patient's functional outcome.   REHAB POTENTIAL: Good  CLINICAL DECISION MAKING: Evolving/moderate complexity  EVALUATION COMPLEXITY: Moderate  PLAN:  PT FREQUENCY: 2x/week  PT DURATION: 8 weeks  PLANNED INTERVENTIONS: 97164- PT Re-evaluation, 97110-Therapeutic exercises, 97530- Therapeutic activity, [id number]- Neuromuscular re-education, 97535- Self Care, 02859- Manual therapy, [id number]- Gait training, [id number]- Aquatic Therapy, Patient/Family education, Cryotherapy, and Moist heat  PLAN FOR NEXT SESSION: Gait and balance training   Alm DELENA Jenny PT, DPT 01/02/2024 8:46 AM

## 2024-01-02 ENCOUNTER — Ambulatory Visit: Payer: Self-pay | Admitting: Physical Therapy

## 2024-01-02 ENCOUNTER — Encounter: Payer: Self-pay | Admitting: Physical Therapy

## 2024-01-02 DIAGNOSIS — R262 Difficulty in walking, not elsewhere classified: Secondary | ICD-10-CM

## 2024-01-02 DIAGNOSIS — M6281 Muscle weakness (generalized): Secondary | ICD-10-CM | POA: Diagnosis not present

## 2024-01-02 DIAGNOSIS — R2681 Unsteadiness on feet: Secondary | ICD-10-CM | POA: Diagnosis not present

## 2024-01-04 DIAGNOSIS — M5031 Other cervical disc degeneration,  high cervical region: Secondary | ICD-10-CM | POA: Diagnosis not present

## 2024-01-04 DIAGNOSIS — M4802 Spinal stenosis, cervical region: Secondary | ICD-10-CM | POA: Diagnosis not present

## 2024-01-07 ENCOUNTER — Ambulatory Visit: Admitting: Physical Therapy

## 2024-01-07 NOTE — Therapy (Deleted)
 OUTPATIENT PHYSICAL THERAPY TREATMENT   Patient Name: Melinda Mckay MRN: [id number] DOB:22-Dec-1945, 78 y.o., female Today's Date: 01/07/2024   PCP: Melinda Mckay REFERRING PROVIDER: Roanna Mckay Referring diagnosis: G81.91 (ICD-10-CM) - Hemiplegia, unspecified affecting right dominant side   END OF SESSION:       Past Medical History:  Diagnosis Date   Anxiety    Asthma    Diabetes mellitus without complication (HCC)    Edema of lower extremity    Hyperlipidemia    Hypertension    Multiple sclerosis    Prediabetes    Restless legs    Past Surgical History:  Procedure Laterality Date   bone spur removal Right    BUNIONECTOMY Bilateral 2005   PARTIAL HYSTERECTOMY  2008   TUBAL LIGATION  1982   Patient Active Problem List   Diagnosis Date Noted   Cerebrovascular accident (CVA) due to occlusion of small artery (HCC) 10/30/2020   Numbness and tingling in right hand 10/30/2020   Acute stroke due to ischemia (HCC) 06/29/2020   Snoring 06/29/2020   Sleep apnea in adult 06/29/2020   Mild intermittent asthma 06/29/2020   Hypokalemia 04/24/2020   Lab test positive for detection of COVID-19 virus 04/24/2020   TIA (transient ischemic attack) 04/23/2020   Diverticulosis of colon 01/16/2018   Internal hemorrhoids 01/16/2018   Tubular adenoma 01/16/2018   Paresthesias 04/28/2017   Anxiety disorder 11/05/2016   Edema of lower extremity 11/05/2016   Essential hypertension 11/05/2016   Hyperlipidemia 11/05/2016   Prediabetes 11/05/2016   Restless legs 11/05/2016   Mild persistent asthma in adult without complication 12/12/2011    ONSET DATE: 2024  REFERRING DIAG: Hemiplegia  THERAPY DIAG:  No diagnosis found.  Rationale for Evaluation and Treatment: Rehabilitation  SUBJECTIVE:  Per eval: Pt states she was diagnosed with MS 1 year ago. She does not see neurologist again until 03/2024. Pt worked with physical therapy at this clinic until 11/2022 and was  discharged with HEP. She also incorporated water aerobics and was doing that until April of this year. She states that since April she has been extremely tired and fatigued and is only able to complete 1 out of the house task a day due to fatigue. She saw PCP a few weeks ago who recommended she resume PT.  She uses a rollator at all times for ambulation. She states that Rt LE is painful up to the hip and feels more weak than Lt. She also has pain in Rt UE and her Rt hand is cold all the time and tingles.  Her biggest complaint is Rt sided weakness and balance Pt accompanied by: self  SUBJECTIVE STATEMENT: 01/07/2024: felt good after last session, was able to be pretty active remainder of day. Has done well with HEP. No other new updates      PERTINENT HISTORY: MS  PAIN:  Are you having pain? Yes: NPRS scale: 0/10 currently, up to 4/10 at worst Pain location: Rt LE and Rt UE Pain description: uncomfortable Aggravating factors: first thing in the morning, prolonged activity Relieving factors: rest  PRECAUTIONS: Fall and Other: MS  RED FLAGS: None   WEIGHT BEARING RESTRICTIONS: No  FALLS: Has patient fallen in last 6 months? Yes. Number of falls 3-4, none with injury  LIVING ENVIRONMENT: Lives with: lives alone Lives in: House/apartment Stairs: No Has following equipment at home: Walker - 4 wheeled  PLOF: Independent with household mobility with device  PATIENT GOALS: improve balance and Rt sided strength  OBJECTIVE:  Note: Objective measures were completed at Evaluation unless otherwise noted.  DIAGNOSTIC FINDINGS: MRI 2024: Findings consistent with the clinical diagnosis of multiple sclerosis. Compared to prior MRI, there is interval progression of the T2 hyperintense lesions in the bilateral corona radiata and  genu of the corpus callosum. No focus of abnormal contrast enhancement to suggest active demyelination.  COGNITION: Overall cognitive status: Within functional limits for tasks assessed     COORDINATION: WFL for RAMS UE and LE    LOWER EXTREMITY MMT:    MMT Right Eval Left Eval  Hip flexion 3- 4  Hip extension    Hip abduction    Hip adduction    Hip internal rotation    Hip external rotation    Knee flexion 3 4  Knee extension 3+ 4+  Ankle dorsiflexion 3 4  Ankle plantarflexion    Ankle inversion    Ankle eversion    (Blank rows = not tested)   GAIT: Findings: Gait Characteristics: wide BOS, Distance walked: 100', Assistive device utilized:3 wheeled rollator, and Comments: slow cadence  FUNCTIONAL TESTS:  30 second chair rise = 3 with UE support TUG : 28.91 seconds with rollator BERG BALANCE TEST Sitting to Standing: 3.      Stands independently using hands Standing Unsupported: 3.      Stands 2 minutes with supervision Sitting Unsupported: 4.     Sits for 2 minutes independently Standing to Sitting: 2.     Uses back of legs against chair to control descent  Transfers: 3.     Transfers safely definite use of hands Standing with eyes closed: 3.     Stands 10 seconds with supervision Standing with feet together: 1.     Needs help to attain position but can hold for 15 seconds Reaching forward with outstretched arm: 1.     Reaches forward with supervision Retrieving object from the floor: 0.     Unable/needs assistance to keep from falling Turning to look behind: 1.     Needs supervision when turning Turning 360 degrees: 0.     Needs assistance Place alternate foot on stool: 1.     Completes >2 steps with minimal assist Standing with one foot in front: 0.     Loses balance while standing/stepping Standing on one foot: 0.     Unable  Total Score: 22/56  TREATMENT:   [facility] Adult PT Treatment:                                                DATE: 01/07/24 Therapeutic Exercise: *** Manual Therapy: *** Neuromuscular re-ed: *** Therapeutic Activity: *** Modalities: *** Self Care: ***  Melinda Mckay Adult PT Treatment:                                                DATE: 01/02/24  Neuromuscular re-ed: Red  light / green light 1 lap CW/CCW each Theraband foam step up 2x12 w UE support  SLS RLE w/ UE support, 3x30sec tactile cues at quad/hamstring   Therapeutic Activity: Lateral stepping at treadmill 2 x 3 laps w UE support cues for clearance and posture Mini squat at treadmill w/ UE support 2x5 cues for comfortable ROM     [facility] Adult PT Treatment:                                                DATE: 12/31/23  Neuromuscular re-ed: Standing yoga block taps w/ UE support 2x10 BIL alt tactile cues for quad/hamstring co contraction in RLE stance Standing hip ext x8 BIL (tactile facilitation at R knee during stance) Standing hip ext x8 BIL tactile facilitation at quad   Therapeutic Activity: STS from raised mat 3x6 cues for weight shifting, guarding at knee but no blocking required  2 inch step up w/ tactile cues for knee mechanics; 2x10 RLE w UE support  Self Care: Education/discussion re: activity modification, pacing of tasks, breaking up HEP throughout day PRN, safety w/ standing activities and modification as indicated    [facility] Adult PT Treatment:                                                DATE: 12/26/23 Therapeutic Exercise: Seated march x10 BIL  AAROM march x8 RLE Seated LAQ x10 BIL  Strap assisted LAQ x8 RLE Seated hip abduction RB x10 HEP discussion/education  Neuromuscular re-ed: Standing fwd/retro stepping x12 BIL UE support unilat  Standing fwd/retro stepping over DB 2x8   Self Care: Education/discussion re: symptom behavior, pt goals in regards to functional mobility (walking, standing balance,  car transfers)    PATIENT EDUCATION: Education details: rationale for interventions, HEP  Person educated: Patient Education method: Explanation, Demonstration, Tactile cues, Verbal cues Education comprehension: verbalized understanding, returned demonstration, verbal cues required, tactile cues required, and needs further education     HOME EXERCISE PROGRAM: Access Code: [email or web address] URL: [email or web address] Date: 01/02/2024 Prepared by: Alm Jenny  Exercises - Seated March  - 1 x daily - 7 x weekly - 3 sets - 10 reps - Seated Long Arc Quad  - 1 x daily - 7 x weekly - 3 sets - 10 reps - Seated Hip Abduction with Resistance  - 1 x daily - 7 x weekly - 3 sets - 10 reps - Sit to Stand with Armchair  -  1 x daily - 7 x weekly - 3 sets - 5 reps - Standing March with Counter Support  - 2-3 x daily - 7 x weekly - 1 sets - 5 reps - Side Stepping with Counter Support  - 2-3 x daily - 7 x weekly - 1 sets - 10 reps  GOALS: Goals reviewed with patient? Yes  SHORT TERM GOALS: Target date: 01/14/2024    Pt will be independent with initial HEP Baseline: Goal status: INITIAL  2.  Pt will improve TUG to <= 24 seconds to demo improved functional mobility Baseline:  Goal status: INITIAL   LONG TERM GOALS: Target date: 02/11/2024    Pt will be independent with advanced HEP including plan for community exercise Baseline:  Goal status: INITIAL  2.  Pt will improve Berg balance test to >=30/56 to demo improving balance Baseline:  Goal status: INITIAL  3.  Pt will improve 30 second chair rise test to >= 6 with UE support Baseline:  Goal status: INITIAL  4.  Pt will improve Rt LE strength to 4/5 to improve functional mobility Baseline:  Goal status: INITIAL   ASSESSMENT:  CLINICAL IMPRESSION: 01/07/2024: Pt arrives w/ report of good response to last session. Today we spend increased time w/ postural stability work emphasizing quad/hamstring co contraction on RLE in  stance. Inc time to complete given rest breaks and time to ensure appropriate setup. Also working on mini squats and lateral stepping for functional LE strength. Tolerates session well, rest breaks PRN, no episodes of buckling/instability. No adverse events. Recommend continuing along current POC in order to address relevant deficits and improve functional tolerance. Pt departs today's session in no acute distress, all voiced questions/concerns addressed appropriately from PT perspective.    OBJECTIVE IMPAIRMENTS: Abnormal gait, decreased activity tolerance, difficulty walking, decreased strength, and impaired UE functional use.   ACTIVITY LIMITATIONS: standing, squatting, transfers, bathing, dressing, self feeding, and locomotion level  PARTICIPATION LIMITATIONS: meal prep, cleaning, driving, and community activity  PERSONAL FACTORS: 1-2 comorbidities: history of TIA, possible MS are also affecting patient's functional outcome.   REHAB POTENTIAL: Good  CLINICAL DECISION MAKING: Evolving/moderate complexity  EVALUATION COMPLEXITY: Moderate  PLAN:  PT FREQUENCY: 2x/week  PT DURATION: 8 weeks  PLANNED INTERVENTIONS: 97164- PT Re-evaluation, 97110-Therapeutic exercises, 97530- Therapeutic activity, [id number]- Neuromuscular re-education, 97535- Self Care, 02859- Manual therapy, [phone]- Gait training, [phone]- Aquatic Therapy, Patient/Family education, Cryotherapy, and Moist heat  PLAN FOR NEXT SESSION: Gait and balance training   Lavanda Cleverly, SPT 01/07/24 6:57 AM

## 2024-01-09 ENCOUNTER — Encounter: Payer: Self-pay | Admitting: Physical Therapy

## 2024-01-09 ENCOUNTER — Ambulatory Visit: Admitting: Physical Therapy

## 2024-01-09 DIAGNOSIS — R262 Difficulty in walking, not elsewhere classified: Secondary | ICD-10-CM | POA: Diagnosis not present

## 2024-01-09 DIAGNOSIS — M6281 Muscle weakness (generalized): Secondary | ICD-10-CM | POA: Diagnosis not present

## 2024-01-09 DIAGNOSIS — R2681 Unsteadiness on feet: Secondary | ICD-10-CM

## 2024-01-09 NOTE — Therapy (Signed)
 OUTPATIENT PHYSICAL THERAPY TREATMENT   Patient Name: Melinda Mckay MRN: [id number] DOB:[birth year], 78 y.o., female Today's Date: 01/09/2024   PCP: Roanna Piedmont REFERRING PROVIDER: Roanna Piedmont Referring diagnosis: G81.91 (ICD-10-CM) - Hemiplegia, unspecified affecting right dominant side   END OF SESSION:  PT End of Session - 01/09/24 0933     Visit Number 5    Number of Visits 16    Date for Recertification  02/12/24    Authorization Type [employer]BS medicare    Authorization - Visit Number 1    Progress Note Due on Visit 10    PT Start Time 0933    PT Stop Time 1015    PT Time Calculation (min) 42 min    Activity Tolerance Patient tolerated treatment well    Behavior During Therapy WFL for tasks assessed/performed              Past Medical History:  Diagnosis Date   Anxiety    Asthma    Diabetes mellitus without complication (HCC)    Edema of lower extremity    Hyperlipidemia    Hypertension    Multiple sclerosis    Prediabetes    Restless legs    Past Surgical History:  Procedure Laterality Date   bone spur removal Right    BUNIONECTOMY Bilateral 2005   PARTIAL HYSTERECTOMY  2008   TUBAL LIGATION  1982   Patient Active Problem List   Diagnosis Date Noted   Cerebrovascular accident (CVA) due to occlusion of small artery (HCC) 10/30/2020   Numbness and tingling in right hand 10/30/2020   Acute stroke due to ischemia (HCC) 06/29/2020   Snoring 06/29/2020   Sleep apnea in adult 06/29/2020   Mild intermittent asthma 06/29/2020   Hypokalemia 04/24/2020   Lab test positive for detection of COVID-19 virus 04/24/2020   TIA (transient ischemic attack) 04/23/2020   Diverticulosis of colon 01/16/2018   Internal hemorrhoids 01/16/2018   Tubular adenoma 01/16/2018   Paresthesias 04/28/2017   Anxiety disorder 11/05/2016   Edema of lower extremity 11/05/2016   Essential hypertension 11/05/2016   Hyperlipidemia 11/05/2016   Prediabetes 11/05/2016    Restless legs 11/05/2016   Mild persistent asthma in adult without complication 12/12/2011    ONSET DATE: 2024  REFERRING DIAG: Hemiplegia  THERAPY DIAG:  Muscle weakness (generalized)  Difficulty in walking, not elsewhere classified  Unsteadiness on feet  Rationale for Evaluation and Treatment: Rehabilitation  SUBJECTIVE:     Per eval: Pt states she was diagnosed with MS 1 year ago. She does not see neurologist again until 03/2024. Pt worked with physical therapy at this clinic until 11/2022 and was discharged with HEP. She also incorporated water aerobics and was doing that until April of this year. She states that since April she has been extremely tired and fatigued and is only able to complete 1 out of the house task a day due to fatigue. She saw PCP a few weeks ago who recommended she resume PT.  She uses a rollator at all times for ambulation. She states that Rt LE is painful up to the hip and feels more weak than Lt. She also has pain in Rt UE and her Rt hand is cold all the time and tingles.  Her biggest complaint is Rt sided weakness and balance Pt accompanied by: self  SUBJECTIVE STATEMENT:   Pt reports she's feeling well, trying to stay warm as the symptoms are enhancing from the cold. Pt reports nothing different since last session. She feels her right leg is very weak.      PERTINENT HISTORY: MS  PAIN:  Are you having pain? Yes: NPRS scale: 0/10 currently, up to 4/10 at worst Pain location: Rt LE and Rt UE Pain description: uncomfortable Aggravating factors: first thing in the morning, prolonged activity Relieving factors: rest  PRECAUTIONS: Fall and Other: MS  RED FLAGS: None   WEIGHT BEARING RESTRICTIONS: No  FALLS: Has patient fallen in last 6 months? Yes. Number of falls 3-4, none with  injury  LIVING ENVIRONMENT: Lives with: lives alone Lives in: House/apartment Stairs: No Has following equipment at home: Walker - 4 wheeled  PLOF: Independent with household mobility with device  PATIENT GOALS: improve balance and Rt sided strength  OBJECTIVE:  Note: Objective measures were completed at Evaluation unless otherwise noted.  DIAGNOSTIC FINDINGS: MRI 2024: Findings consistent with the clinical diagnosis of multiple sclerosis. Compared to prior MRI, there is interval progression of the T2 hyperintense lesions in the bilateral corona radiata and genu of the corpus callosum. No focus of abnormal contrast enhancement to suggest active demyelination.  COGNITION: Overall cognitive status: Within functional limits for tasks assessed     COORDINATION: WFL for RAMS UE and LE    LOWER EXTREMITY MMT:    MMT Right Eval Left Eval  Hip flexion 3- 4  Hip extension    Hip abduction    Hip adduction    Hip internal rotation    Hip external rotation    Knee flexion 3 4  Knee extension 3+ 4+  Ankle dorsiflexion 3 4  Ankle plantarflexion    Ankle inversion    Ankle eversion    (Blank rows = not tested)   GAIT: Findings: Gait Characteristics: wide BOS, Distance walked: 100', Assistive device utilized:3 wheeled rollator, and Comments: slow cadence  FUNCTIONAL TESTS:  30 second chair rise = 3 with UE support TUG : 28.91 seconds with rollator BERG BALANCE TEST Sitting to Standing: 3.      Stands independently using hands Standing Unsupported: 3.      Stands 2 minutes with supervision Sitting Unsupported: 4.     Sits for 2 minutes independently Standing to Sitting: 2.     Uses back of legs against chair to control descent  Transfers: 3.     Transfers safely definite use of hands Standing with eyes closed: 3.     Stands 10 seconds with supervision Standing with feet together: 1.     Needs help to attain position but can hold for 15 seconds Reaching forward with  outstretched arm: 1.     Reaches forward with supervision Retrieving object from the floor: 0.     Unable/needs assistance to keep from falling Turning to look behind: 1.     Needs supervision when turning Turning 360 degrees: 0.     Needs assistance Place alternate foot on stool: 1.     Completes >2 steps with minimal assist Standing with one foot in front: 0.     Loses balance while standing/stepping Standing on one foot: 0.     Unable  Total Score: 22/56  TREATMENT:   [facility] Adult PT Treatment:                                                DATE: 01/09/24  Neuromuscular re-ed: LAQ in seated x20 each leg - VC for quad activation  Knee flexion isometrics on stability ball x10 both, x10 right only -neuromuscular re ed right hamstring  Weight shifts in standing with UE support left to right x10  Seated ball press for core activation x5 Therapeutic Activity: Standing hamstring curls x8 righteg only with facilitation to right leg , x10 left hamstring curls  Lateral side stepping 4 laps (down an dback is 1) at counter   Self Care: Patient education for exercises to do in the car for long trip   [facility] Adult PT Treatment:                                                DATE: 01/02/24  Neuromuscular re-ed: Red  light / green light 1 lap CW/CCW each Theraband foam step up 2x12 w UE support  SLS RLE w/ UE support, 3x30sec tactile cues at quad/hamstring   Therapeutic Activity: Lateral stepping at treadmill 2 x 3 laps w UE support cues for clearance and posture Mini squat at treadmill w/ UE support 2x5 cues for comfortable ROM     [facility] Adult PT Treatment:                                                DATE: 12/31/23  Neuromuscular re-ed: Standing yoga block taps w/ UE support 2x10 BIL alt tactile cues for quad/hamstring co contraction in RLE stance Standing hip  ext x8 BIL (tactile facilitation at R knee during stance) Standing hip ext x8 BIL tactile facilitation at quad   Therapeutic Activity: STS from raised mat 3x6 cues for weight shifting, guarding at knee but no blocking required  2 inch step up w/ tactile cues for knee mechanics; 2x10 RLE w UE support  Self Care: Education/discussion re: activity modification, pacing of tasks, breaking up HEP throughout day PRN, safety w/ standing activities and modification as indicated    [facility] Adult PT Treatment:                                                DATE: 12/26/23 Therapeutic Exercise: Seated march x10 BIL  AAROM march x8 RLE Seated LAQ x10 BIL  Strap assisted LAQ x8 RLE Seated hip abduction RB x10 HEP discussion/education  Neuromuscular re-ed: Standing fwd/retro stepping x12 BIL UE support unilat  Standing fwd/retro stepping over DB 2x8   Self Care: Education/discussion re: symptom behavior, pt goals in regards to functional mobility (walking, standing balance, car transfers)    PATIENT EDUCATION: Education details: rationale for interventions, HEP  Person educated: Patient Education method: Explanation, Demonstration, Tactile cues, Verbal cues Education comprehension: verbalized understanding, returned demonstration, verbal cues required, tactile cues required, and needs further education     HOME  EXERCISE PROGRAM: Access Code: [email or web address] URL: [email or web address] Date: 01/02/2024 Prepared by: Alm Jenny  Exercises - Seated March  - 1 x daily - 7 x weekly - 3 sets - 10 reps - Seated Long Arc Quad  - 1 x daily - 7 x weekly - 3 sets - 10 reps - Seated Hip Abduction with Resistance  - 1 x daily - 7 x weekly - 3 sets - 10 reps - Sit to Stand with Armchair  - 1 x daily - 7 x weekly - 3 sets - 5 reps - Standing March with Counter Support  - 2-3 x daily - 7 x weekly - 1 sets - 5 reps - Side Stepping with Counter Support  - 2-3 x daily - 7 x weekly - 1 sets - 10  reps  GOALS: Goals reviewed with patient? Yes  SHORT TERM GOALS: Target date: 01/14/2024    Pt will be independent with initial HEP Baseline: Goal status: INITIAL  2.  Pt will improve TUG to <= 24 seconds to demo improved functional mobility Baseline:  Goal status: INITIAL   LONG TERM GOALS: Target date: 02/11/2024    Pt will be independent with advanced HEP including plan for community exercise Baseline:  Goal status: INITIAL  2.  Pt will improve Berg balance test to >=30/56 to demo improving balance Baseline:  Goal status: INITIAL  3.  Pt will improve 30 second chair rise test to >= 6 with UE support Baseline:  Goal status: INITIAL  4.  Pt will improve Rt LE strength to 4/5 to improve functional mobility Baseline:  Goal status: INITIAL   ASSESSMENT:  CLINICAL IMPRESSION: Today's focus challenged single limb balance on both legs, incorporating weight shifting with UE support, followed by weight shifting plus lateral side steps. Pt tolerated exercise well, right knee locking only one time toward end of last set. Emphasized importance of practicing LAQ to right leg at home to promote knee stability during transfers and gait. Introduced new exercise implementing core activation during transfers, pt tolerated well.    OBJECTIVE IMPAIRMENTS: Abnormal gait, decreased activity tolerance, difficulty walking, decreased strength, and impaired UE functional use.   ACTIVITY LIMITATIONS: standing, squatting, transfers, bathing, dressing, self feeding, and locomotion level  PARTICIPATION LIMITATIONS: meal prep, cleaning, driving, and community activity  PERSONAL FACTORS: 1-2 comorbidities: history of TIA, possible MS are also affecting patient's functional outcome.   REHAB POTENTIAL: Good  CLINICAL DECISION MAKING: Evolving/moderate complexity  EVALUATION COMPLEXITY: Moderate  PLAN:  PT FREQUENCY: 2x/week  PT DURATION: 8 weeks  PLANNED INTERVENTIONS: 97164- PT  Re-evaluation, 97110-Therapeutic exercises, 97530- Therapeutic activity, [id number]- Neuromuscular re-education, 97535- Self Care, [id number]- Manual therapy, [id number]- Gait training, [phone]- Aquatic Therapy, Patient/Family education, Cryotherapy, and Moist heat  PLAN FOR NEXT SESSION: Gait and balance training, single limb, LAQ   Lavanda Cleverly, SPT 01/09/24 11:59 AM ``

## 2024-01-10 DIAGNOSIS — G35A Relapsing-remitting multiple sclerosis: Secondary | ICD-10-CM | POA: Diagnosis not present

## 2024-01-10 DIAGNOSIS — M1711 Unilateral primary osteoarthritis, right knee: Secondary | ICD-10-CM | POA: Diagnosis not present

## 2024-01-10 DIAGNOSIS — G992 Myelopathy in diseases classified elsewhere: Secondary | ICD-10-CM | POA: Diagnosis not present

## 2024-01-19 NOTE — Therapy (Signed)
 OUTPATIENT PHYSICAL THERAPY TREATMENT   Patient Name: Melinda Mckay MRN: [id number] DOB:1945/08/03, 78 y.o., female Today's Date: 01/20/2024   PCP: Roanna Piedmont REFERRING PROVIDER: Roanna Piedmont Referring diagnosis: G81.91 (ICD-10-CM) - Hemiplegia, unspecified affecting right dominant side   END OF SESSION:  PT End of Session - 01/20/24 1014     Visit Number 6    Number of Visits 16    Date for Recertification  02/12/24    Authorization Type BCBS medicare    Progress Note Due on Visit 10    PT Start Time 1017    PT Stop Time 1059    PT Time Calculation (min) 42 min    Activity Tolerance Patient tolerated treatment well    Behavior During Therapy WFL for tasks assessed/performed               Past Medical History:  Diagnosis Date   Anxiety    Asthma    Diabetes mellitus without complication (HCC)    Edema of lower extremity    Hyperlipidemia    Hypertension    Multiple sclerosis    Prediabetes    Restless legs    Past Surgical History:  Procedure Laterality Date   bone spur removal Right    BUNIONECTOMY Bilateral 2005   PARTIAL HYSTERECTOMY  2008   TUBAL LIGATION  1982   Patient Active Problem List   Diagnosis Date Noted   Cerebrovascular accident (CVA) due to occlusion of small artery (HCC) 10/30/2020   Numbness and tingling in right hand 10/30/2020   Acute stroke due to ischemia (HCC) 06/29/2020   Snoring 06/29/2020   Sleep apnea in adult 06/29/2020   Mild intermittent asthma 06/29/2020   Hypokalemia 04/24/2020   Lab test positive for detection of COVID-19 virus 04/24/2020   TIA (transient ischemic attack) 04/23/2020   Diverticulosis of colon 01/16/2018   Internal hemorrhoids 01/16/2018   Tubular adenoma 01/16/2018   Paresthesias 04/28/2017   Anxiety disorder 11/05/2016   Edema of lower extremity 11/05/2016   Essential hypertension 11/05/2016   Hyperlipidemia 11/05/2016   Prediabetes 11/05/2016   Restless legs 11/05/2016   Mild  persistent asthma in adult without complication 12/12/2011    ONSET DATE: 2024  REFERRING DIAG: Hemiplegia  THERAPY DIAG:  Muscle weakness (generalized)  Difficulty in walking, not elsewhere classified  Unsteadiness on feet  Rationale for Evaluation and Treatment: Rehabilitation  SUBJECTIVE:  The hotel I stayed in did not have a handicapped commode. It was really low.   Per eval: Pt states she was diagnosed with MS 1 year ago. She does not see neurologist again until 03/2024. Pt worked with physical therapy at this clinic until 11/2022 and was discharged with HEP. She also incorporated water aerobics and was doing that until April of this year. She states that since April she has been extremely tired and fatigued and is only able to complete 1 out of the house task a day due to fatigue. She saw PCP a few weeks ago who recommended she resume PT.  She uses a rollator at all times for ambulation. She states that Rt LE is painful up to the hip and feels more weak than Lt. She also has pain in Rt UE and her Rt hand is cold all the time and tingles.  Her biggest complaint is Rt sided weakness and balance Pt accompanied by: self  SUBJECTIVE STATEMENT: The hotel I stayed at did not have a handicap commode.  Pt reports she's feeling well, trying to stay warm as the symptoms are enhancing from the cold. Pt reports nothing different since last session. She feels her right leg is very weak.      PERTINENT HISTORY: MS  PAIN:  Are you having pain? Yes: NPRS scale: 0/10 currently, up to 4/10 at worst Pain location: Rt LE and Rt UE Pain description: uncomfortable Aggravating factors: first thing in the morning, prolonged activity Relieving factors: rest  PRECAUTIONS: Fall and Other: MS  RED FLAGS: None   WEIGHT BEARING RESTRICTIONS:  No  FALLS: Has patient fallen in last 6 months? Yes. Number of falls 3-4, none with injury  LIVING ENVIRONMENT: Lives with: lives alone Lives in: House/apartment Stairs: No Has following equipment at home: Walker - 4 wheeled  PLOF: Independent with household mobility with device  PATIENT GOALS: improve balance and Rt sided strength  OBJECTIVE:  Note: Objective measures were completed at Evaluation unless otherwise noted.  DIAGNOSTIC FINDINGS: MRI 2024: Findings consistent with the clinical diagnosis of multiple sclerosis. Compared to prior MRI, there is interval progression of the T2 hyperintense lesions in the bilateral corona radiata and genu of the corpus callosum. No focus of abnormal contrast enhancement to suggest active demyelination.  COGNITION: Overall cognitive status: Within functional limits for tasks assessed     COORDINATION: WFL for RAMS UE and LE    LOWER EXTREMITY MMT:    MMT Right Eval Left Eval  Hip flexion 3- 4  Hip extension    Hip abduction    Hip adduction    Hip internal rotation    Hip external rotation    Knee flexion 3 4  Knee extension 3+ 4+  Ankle dorsiflexion 3 4  Ankle plantarflexion    Ankle inversion    Ankle eversion    (Blank rows = not tested)   GAIT: Findings: Gait Characteristics: wide BOS, Distance walked: 100', Assistive device utilized:3 wheeled rollator, and Comments: slow cadence  FUNCTIONAL TESTS:  30 second chair rise = 3 with UE support TUG : 28.91 seconds with rollator BERG BALANCE TEST Sitting to Standing: 3.      Stands independently using hands Standing Unsupported: 3.      Stands 2 minutes with supervision Sitting Unsupported: 4.     Sits for 2 minutes independently Standing to Sitting: 2.     Uses back of legs against chair to control descent  Transfers: 3.     Transfers safely definite use of hands Standing with eyes closed: 3.     Stands 10 seconds with supervision Standing with feet together: 1.      Needs help to attain position but can hold for 15 seconds Reaching forward with outstretched arm: 1.     Reaches forward with supervision Retrieving object from the floor: 0.     Unable/needs assistance to keep from falling Turning to look behind: 1.     Needs supervision when turning Turning 360 degrees: 0.     Needs assistance Place alternate foot on stool: 1.     Completes >2 steps with minimal assist Standing with one foot in front: 0.     Loses balance while standing/stepping Standing on one foot: 0.     Unable  Total Score: 22/56  TREATMENT:   [facility] Adult PT Treatment:                                                DATE: 01/20/24  Neuromuscular re-ed: LAQ in seated x10 each leg - VC for quad activation ; 5 sec hold with slow lowering x 5 R, L 4# 2 x 10 Seated knee flexion with heel on slider x 10, then 10 with yellow band resistance (only partial range due to weakness) Seated marching x 10 R, 4# x 10 L Seated up and overs ( using strap as a line to move foot over ) x 10 R only Seated ball press for core activation x 10 5 sec holds SLS 2x30 sec with UE support Therapeutic Activity: Standing hamstring curls x8 righteg only with facilitation to right leg , x10 left hamstring curls  Lateral side stepping 4 laps (down an dback is 1) at TM Step ups on foam x 5 B and discussed correct procedure for stairs     [facility] Adult PT Treatment:                                                DATE: 01/09/24  Neuromuscular re-ed: LAQ in seated x20 each leg - VC for quad activation  Knee flexion isometrics on stability ball x10 both, x10 right only -neuromuscular re ed right hamstring  Weight shifts in standing with UE support left to right x10  Seated ball press for core activation x5 Therapeutic Activity: Standing hamstring curls x8 righteg only with facilitation  to right leg , x10 left hamstring curls  Lateral side stepping 4 laps (down an dback is 1) at counter   Self Care: Patient education for exercises to do in the car for long trip   [facility] Adult PT Treatment:                                                DATE: 01/02/24  Neuromuscular re-ed: Red  light / green light 1 lap CW/CCW each Theraband foam step up 2x12 w UE support  SLS RLE w/ UE support, 3x30sec tactile cues at quad/hamstring   Therapeutic Activity: Lateral stepping at treadmill 2 x 3 laps w UE support cues for clearance and posture Mini squat at treadmill w/ UE support 2x5 cues for comfortable ROM     [facility] Adult PT Treatment:                                                DATE: 12/31/23  Neuromuscular re-ed: Standing yoga block taps w/ UE support 2x10 BIL alt tactile cues for quad/hamstring co contraction in RLE stance Standing hip ext x8 BIL (tactile facilitation at R knee during stance) Standing hip ext x8 BIL tactile facilitation at quad   Therapeutic Activity: STS from raised mat 3x6 cues for weight shifting, guarding at knee but no blocking required  2 inch step up w/ tactile cues for knee  mechanics; 2x10 RLE w UE support  Self Care: Education/discussion re: activity modification, pacing of tasks, breaking up HEP throughout day PRN, safety w/ standing activities and modification as indicated    [facility] Adult PT Treatment:                                                DATE: 12/26/23 Therapeutic Exercise: Seated march x10 BIL  AAROM march x8 RLE Seated LAQ x10 BIL  Strap assisted LAQ x8 RLE Seated hip abduction RB x10 HEP discussion/education  Neuromuscular re-ed: Standing fwd/retro stepping x12 BIL UE support unilat  Standing fwd/retro stepping over DB 2x8   Self Care: Education/discussion re: symptom behavior, pt goals in regards to functional mobility (walking, standing balance, car transfers)    PATIENT EDUCATION: Education details: rationale for  interventions, HEP  Person educated: Patient Education method: Explanation, Demonstration, Tactile cues, Verbal cues Education comprehension: verbalized understanding, returned demonstration, verbal cues required, tactile cues required, and needs further education     HOME EXERCISE PROGRAM: Access Code: [email or web address] URL: [email or web address] Date: 01/02/2024 Prepared by: Alm Jenny  Exercises - Seated March  - 1 x daily - 7 x weekly - 3 sets - 10 reps - Seated Long Arc Quad  - 1 x daily - 7 x weekly - 3 sets - 10 reps - Seated Hip Abduction with Resistance  - 1 x daily - 7 x weekly - 3 sets - 10 reps - Sit to Stand with Armchair  - 1 x daily - 7 x weekly - 3 sets - 5 reps - Standing March with Counter Support  - 2-3 x daily - 7 x weekly - 1 sets - 5 reps - Side Stepping with Counter Support  - 2-3 x daily - 7 x weekly - 1 sets - 10 reps  GOALS: Goals reviewed with patient? Yes  SHORT TERM GOALS: Target date: 01/14/2024    Pt will be independent with initial HEP Baseline: Goal status: INITIAL  2.  Pt will improve TUG to <= 24 seconds to demo improved functional mobility Baseline:  Goal status: INITIAL   LONG TERM GOALS: Target date: 02/11/2024    Pt will be independent with advanced HEP including plan for community exercise Baseline:  Goal status: INITIAL  2.  Pt will improve Berg balance test to >=30/56 to demo improving balance Baseline:  Goal status: INITIAL  3.  Pt will improve 30 second chair rise test to >= 6 with UE support Baseline:  Goal status: INITIAL  4.  Pt will improve Rt LE strength to 4/5 to improve functional mobility Baseline:  Goal status: INITIAL   ASSESSMENT:  CLINICAL IMPRESSION: Patient able to to tolerate increased resistance with some exercises today. She had some questions regarding which leg to use when using stairs, so we discussed this. She would benefit from a few trials of stairs in the clinic to ensure correct form.      OBJECTIVE IMPAIRMENTS: Abnormal gait, decreased activity tolerance, difficulty walking, decreased strength, and impaired UE functional use.   ACTIVITY LIMITATIONS: standing, squatting, transfers, bathing, dressing, self feeding, and locomotion level  PARTICIPATION LIMITATIONS: meal prep, cleaning, driving, and community activity  PERSONAL FACTORS: 1-2 comorbidities: history of TIA, possible MS are also affecting patient's functional outcome.   REHAB POTENTIAL: Good  CLINICAL DECISION MAKING: Evolving/moderate complexity  EVALUATION COMPLEXITY: Moderate  PLAN:  PT FREQUENCY: 2x/week  PT DURATION: 8 weeks  PLANNED INTERVENTIONS: 97164- PT Re-evaluation, 97110-Therapeutic exercises, 97530- Therapeutic activity, [id number]- Neuromuscular re-education, 97535- Self Care, [id number]- Manual therapy, [phone]- Gait training, [phone]- Aquatic Therapy, Patient/Family education, Cryotherapy, and Moist heat  PLAN FOR NEXT SESSION: Gait and balance training, single limb, ELBERT Mliss Cummins, PT  01/20/24 11:05 AM ``

## 2024-01-20 ENCOUNTER — Encounter: Payer: Self-pay | Admitting: Physical Therapy

## 2024-01-20 ENCOUNTER — Ambulatory Visit: Attending: Internal Medicine | Admitting: Physical Therapy

## 2024-01-20 DIAGNOSIS — M6281 Muscle weakness (generalized): Secondary | ICD-10-CM | POA: Diagnosis not present

## 2024-01-20 DIAGNOSIS — R2681 Unsteadiness on feet: Secondary | ICD-10-CM | POA: Insufficient documentation

## 2024-01-20 DIAGNOSIS — R262 Difficulty in walking, not elsewhere classified: Secondary | ICD-10-CM | POA: Diagnosis not present

## 2024-01-22 ENCOUNTER — Ambulatory Visit: Admitting: Physical Therapy

## 2024-01-22 ENCOUNTER — Encounter: Payer: Self-pay | Admitting: Physical Therapy

## 2024-01-22 DIAGNOSIS — R2681 Unsteadiness on feet: Secondary | ICD-10-CM | POA: Diagnosis not present

## 2024-01-22 DIAGNOSIS — M6281 Muscle weakness (generalized): Secondary | ICD-10-CM | POA: Diagnosis not present

## 2024-01-22 DIAGNOSIS — R262 Difficulty in walking, not elsewhere classified: Secondary | ICD-10-CM | POA: Diagnosis not present

## 2024-01-22 NOTE — Therapy (Signed)
 OUTPATIENT PHYSICAL THERAPY TREATMENT   Patient Name: Melinda Mckay MRN: [id number] DOB:01/13/1946, 78 y.o., female Today's Date: 01/22/2024   PCP: Roanna Piedmont REFERRING PROVIDER: Roanna Piedmont Referring diagnosis: G81.91 (ICD-10-CM) - Hemiplegia, unspecified affecting right dominant side   END OF SESSION:  PT End of Session - 01/22/24 1009     Visit Number 7    Number of Visits 16    Date for Recertification  02/12/24    Authorization Type [employer] medicare    Progress Note Due on Visit 10    PT Start Time 1010    PT Stop Time 1056    PT Time Calculation (min) 46 min                Past Medical History:  Diagnosis Date   Anxiety    Asthma    Diabetes mellitus without complication (HCC)    Edema of lower extremity    Hyperlipidemia    Hypertension    Multiple sclerosis    Prediabetes    Restless legs    Past Surgical History:  Procedure Laterality Date   bone spur removal Right    BUNIONECTOMY Bilateral 2005   PARTIAL HYSTERECTOMY  2008   TUBAL LIGATION  1982   Patient Active Problem List   Diagnosis Date Noted   Cerebrovascular accident (CVA) due to occlusion of small artery (HCC) 10/30/2020   Numbness and tingling in right hand 10/30/2020   Acute stroke due to ischemia (HCC) 06/29/2020   Snoring 06/29/2020   Sleep apnea in adult 06/29/2020   Mild intermittent asthma 06/29/2020   Hypokalemia 04/24/2020   Lab test positive for detection of COVID-19 virus 04/24/2020   TIA (transient ischemic attack) 04/23/2020   Diverticulosis of colon 01/16/2018   Internal hemorrhoids 01/16/2018   Tubular adenoma 01/16/2018   Paresthesias 04/28/2017   Anxiety disorder 11/05/2016   Edema of lower extremity 11/05/2016   Essential hypertension 11/05/2016   Hyperlipidemia 11/05/2016   Prediabetes 11/05/2016   Restless legs 11/05/2016   Mild persistent asthma in adult without complication 12/12/2011    ONSET DATE: 2024  REFERRING DIAG:  Hemiplegia  THERAPY DIAG:  Muscle weakness (generalized)  Difficulty in walking, not elsewhere classified  Rationale for Evaluation and Treatment: Rehabilitation  SUBJECTIVE:   Per eval: Pt states she was diagnosed with MS 1 year ago. She does not see neurologist again until 03/2024. Pt worked with physical therapy at this clinic until 11/2022 and was discharged with HEP. She also incorporated water aerobics and was doing that until April of this year. She states that since April she has been extremely tired and fatigued and is only able to complete 1 out of the house task a day due to fatigue. She saw PCP a few weeks ago who recommended she resume PT.  She uses a rollator at all times for ambulation. She states that Rt LE is painful up to the hip and feels more weak than Lt. She also has pain in Rt UE and her Rt hand is cold all the time and tingles.  Her biggest complaint is Rt sided weakness and balance Pt accompanied by: self  SUBJECTIVE STATEMENT: 01/22/2024: has been trying to integrate HEP into daily routine. Knee a bit stiff today which she attributes to weather. Sees neurology Nov 19th. No issues after last session.   PERTINENT HISTORY: MS  PAIN:  Are you having pain? Yes: NPRS scale: 0/10 currently, up to 4/10 at worst Pain location: Rt LE and Rt UE Pain description: uncomfortable Aggravating factors: first thing in the morning, prolonged activity Relieving factors: rest  PRECAUTIONS: Fall and Other: MS  RED FLAGS: None   WEIGHT BEARING RESTRICTIONS: No  FALLS: Has patient fallen in last 6 months? Yes. Number of falls 3-4, none with injury  LIVING ENVIRONMENT: Lives with: lives alone Lives in: House/apartment Stairs: No Has following equipment at home: Walker - 4 wheeled  PLOF: Independent with household  mobility with device  PATIENT GOALS: improve balance and Rt sided strength  OBJECTIVE:  Note: Objective measures were completed at Evaluation unless otherwise noted.  DIAGNOSTIC FINDINGS: MRI 2024: Findings consistent with the clinical diagnosis of multiple sclerosis. Compared to prior MRI, there is interval progression of the T2 hyperintense lesions in the bilateral corona radiata and genu of the corpus callosum. No focus of abnormal contrast enhancement to suggest active demyelination.  COGNITION: Overall cognitive status: Within functional limits for tasks assessed     COORDINATION: WFL for RAMS UE and LE    LOWER EXTREMITY MMT:    MMT Right Eval Left Eval  Hip flexion 3- 4  Hip extension    Hip abduction    Hip adduction    Hip internal rotation    Hip external rotation    Knee flexion 3 4  Knee extension 3+ 4+  Ankle dorsiflexion 3 4  Ankle plantarflexion    Ankle inversion    Ankle eversion    (Blank rows = not tested)   GAIT: Findings: Gait Characteristics: wide BOS, Distance walked: 100', Assistive device utilized:3 wheeled rollator, and Comments: slow cadence  FUNCTIONAL TESTS:  30 second chair rise = 3 with UE support TUG : 28.91 seconds with rollator BERG BALANCE TEST Sitting to Standing: 3.      Stands independently using hands Standing Unsupported: 3.      Stands 2 minutes with supervision Sitting Unsupported: 4.     Sits for 2 minutes independently Standing to Sitting: 2.     Uses back of legs against chair to control descent  Transfers: 3.     Transfers safely definite use of hands Standing with eyes closed: 3.     Stands 10 seconds with supervision Standing with feet together: 1.     Needs help to attain position but can hold for 15 seconds Reaching forward with outstretched arm: 1.     Reaches forward with supervision Retrieving object from the floor: 0.     Unable/needs assistance to keep from falling Turning to look behind: 1.     Needs  supervision when turning Turning 360 degrees: 0.     Needs assistance Place alternate foot on stool: 1.     Completes >2 steps with minimal assist Standing with one foot in front: 0.     Loses balance while standing/stepping Standing on one foot: 0.     Unable  Total Score: 22/56    01/22/24: - TUG: 21.70sec w rollator  TREATMENT:   [facility] Adult PT Treatment:                                                DATE: 01/22/24 Therapeutic Exercise: Foam roller assisted LAQ, seated, RLE x15 RB lateral stepping along mat 4 laps, BIL HHA assist cues for posture HEP discussion/education  Neuromuscular re-ed: Seated knee flex/ext w/ ball; 2x8 RLE cues for motor control Standing reactive RLE reaches (green, orange, red dots) 4x30sec increasing complexity   Therapeutic Activity: TUG x2 + education (second bout for activity tolerance) STS x5 from mat w/ UE support STS x5 from slightly raised mat w/o UE support cues for mechanics and trunk lean     [facility] Adult PT Treatment:                                                DATE: 01/20/24  Neuromuscular re-ed: LAQ in seated x10 each leg - VC for quad activation ; 5 sec hold with slow lowering x 5 R, L 4# 2 x 10 Seated knee flexion with heel on slider x 10, then 10 with yellow band resistance (only partial range due to weakness) Seated marching x 10 R, 4# x 10 L Seated up and overs ( using strap as a line to move foot over ) x 10 R only Seated ball press for core activation x 10 5 sec holds SLS 2x30 sec with UE support Therapeutic Activity: Standing hamstring curls x8 righteg only with facilitation to right leg , x10 left hamstring curls  Lateral side stepping 4 laps (down an dback is 1) at TM Step ups on foam x 5 B and discussed correct procedure for stairs     [facility] Adult PT Treatment:                                                 DATE: 01/09/24  Neuromuscular re-ed: LAQ in seated x20 each leg - VC for quad activation  Knee flexion isometrics on stability ball x10 both, x10 right only -neuromuscular re ed right hamstring  Weight shifts in standing with UE support left to right x10  Seated ball press for core activation x5 Therapeutic Activity: Standing hamstring curls x8 righteg only with facilitation to right leg , x10 left hamstring curls  Lateral side stepping 4 laps (down an dback is 1) at counter   Self Care: Patient education for exercises to do in the car for long trip   [facility] Adult PT Treatment:                                                DATE: 01/02/24  Neuromuscular re-ed: Red  light / green light 1 lap CW/CCW each Theraband foam step up 2x12 w UE support  SLS RLE w/ UE support, 3x30sec tactile cues at quad/hamstring   Therapeutic Activity: Lateral stepping at treadmill 2 x 3 laps w UE support cues for clearance and posture Mini  squat at treadmill w/ UE support 2x5 cues for comfortable ROM     [facility] Adult PT Treatment:                                                DATE: 12/31/23  Neuromuscular re-ed: Standing yoga block taps w/ UE support 2x10 BIL alt tactile cues for quad/hamstring co contraction in RLE stance Standing hip ext x8 BIL (tactile facilitation at R knee during stance) Standing hip ext x8 BIL tactile facilitation at quad   Therapeutic Activity: STS from raised mat 3x6 cues for weight shifting, guarding at knee but no blocking required  2 inch step up w/ tactile cues for knee mechanics; 2x10 RLE w UE support  Self Care: Education/discussion re: activity modification, pacing of tasks, breaking up HEP throughout day PRN, safety w/ standing activities and modification as indicated    [facility] Adult PT Treatment:                                                DATE: 12/26/23 Therapeutic Exercise: Seated march x10 BIL  AAROM march x8 RLE Seated LAQ x10 BIL  Strap  assisted LAQ x8 RLE Seated hip abduction RB x10 HEP discussion/education  Neuromuscular re-ed: Standing fwd/retro stepping x12 BIL UE support unilat  Standing fwd/retro stepping over DB 2x8   Self Care: Education/discussion re: symptom behavior, pt goals in regards to functional mobility (walking, standing balance, car transfers)    PATIENT EDUCATION: Education details: rationale for interventions, HEP  Person educated: Patient Education method: Explanation, Demonstration, Tactile cues, Verbal cues Education comprehension: verbalized understanding, returned demonstration, verbal cues required, tactile cues required, and needs further education     HOME EXERCISE PROGRAM: Access Code: [email or web address] URL: [email or web address] Date: 01/02/2024 Prepared by: Alm Jenny  Exercises - Seated March  - 1 x daily - 7 x weekly - 3 sets - 10 reps - Seated Long Arc Quad  - 1 x daily - 7 x weekly - 3 sets - 10 reps - Seated Hip Abduction with Resistance  - 1 x daily - 7 x weekly - 3 sets - 10 reps - Sit to Stand with Armchair  - 1 x daily - 7 x weekly - 3 sets - 5 reps - Standing March with Counter Support  - 2-3 x daily - 7 x weekly - 1 sets - 5 reps - Side Stepping with Counter Support  - 2-3 x daily - 7 x weekly - 1 sets - 10 reps  GOALS: Goals reviewed with patient? Yes  SHORT TERM GOALS: Target date: 01/14/2024    Pt will be independent with initial HEP Baseline: 01/22/24: pt reports good HEP adherence Goal status: MET  2.  Pt will improve TUG to <= 24 seconds to demo improved functional mobility Baseline:  01/22/24: 21 sec w rollator   Goal status: MET   LONG TERM GOALS: Target date: 02/11/2024    Pt will be independent with advanced HEP including plan for community exercise Baseline:  Goal status: INITIAL  2.  Pt will improve Berg balance test to >=30/56 to demo improving balance Baseline:  Goal status: INITIAL  3.  Pt will improve 30 second chair rise  test to >= 6  with UE support Baseline:  Goal status: INITIAL  4.  Pt will improve Rt LE strength to 4/5 to improve functional mobility Baseline:  Goal status: INITIAL   ASSESSMENT:  CLINICAL IMPRESSION: 01/22/2024: Pt arrives w/ knee stiffness, no issues after last session. Today continuing to work on LE stability in open/closed chain, functional strengthening. TUG performed in 21sec which is improved compared to initial assessment and has met STG. She tolerates today's activities well without increase in pain, no adverse events. Recommend continuing along current POC in order to address relevant deficits and improve functional tolerance. Pt departs today's session in no acute distress, all voiced questions/concerns addressed appropriately from PT perspective.       OBJECTIVE IMPAIRMENTS: Abnormal gait, decreased activity tolerance, difficulty walking, decreased strength, and impaired UE functional use.   ACTIVITY LIMITATIONS: standing, squatting, transfers, bathing, dressing, self feeding, and locomotion level  PARTICIPATION LIMITATIONS: meal prep, cleaning, driving, and community activity  PERSONAL FACTORS: 1-2 comorbidities: history of TIA, possible MS are also affecting patient's functional outcome.   REHAB POTENTIAL: Good  CLINICAL DECISION MAKING: Evolving/moderate complexity  EVALUATION COMPLEXITY: Moderate  PLAN:  PT FREQUENCY: 2x/week  PT DURATION: 8 weeks  PLANNED INTERVENTIONS: [id number]- PT Re-evaluation, 97110-Therapeutic exercises, 97530- Therapeutic activity, [id number]- Neuromuscular re-education, 97535- Self Care, [id number]- Manual therapy, [id number]- Gait training, [phone]- Aquatic Therapy, Patient/Family education, Cryotherapy, and Moist heat  PLAN FOR NEXT SESSION: Gait and balance training, single limb, LAQ   Alm DELENA Jenny PT, DPT 01/22/2024 12:43 PM  ``

## 2024-01-27 ENCOUNTER — Encounter: Payer: Self-pay | Admitting: Physical Therapy

## 2024-01-27 ENCOUNTER — Ambulatory Visit: Admitting: Physical Therapy

## 2024-01-27 DIAGNOSIS — R2681 Unsteadiness on feet: Secondary | ICD-10-CM

## 2024-01-27 DIAGNOSIS — M6281 Muscle weakness (generalized): Secondary | ICD-10-CM | POA: Diagnosis not present

## 2024-01-27 DIAGNOSIS — R262 Difficulty in walking, not elsewhere classified: Secondary | ICD-10-CM | POA: Diagnosis not present

## 2024-01-27 NOTE — Therapy (Signed)
 OUTPATIENT PHYSICAL THERAPY TREATMENT   Patient Name: Melinda Mckay MRN: [id number] DOB:1945-12-26, 78 y.o., female Today's Date: 01/27/2024   PCP: Roanna Piedmont REFERRING PROVIDER: Roanna Piedmont Referring diagnosis: G81.91 (ICD-10-CM) - Hemiplegia, unspecified affecting right dominant side   END OF SESSION:  PT End of Session - 01/27/24 1101     Visit Number 8    Number of Visits 16    Date for Recertification  02/12/24    Authorization Type [employer] medicare    Authorization - Visit Number 8    Progress Note Due on Visit 10    PT Start Time 1015    PT Stop Time 1058    PT Time Calculation (min) 43 min    Activity Tolerance Patient tolerated treatment well    Behavior During Therapy WFL for tasks assessed/performed                 Past Medical History:  Diagnosis Date   Anxiety    Asthma    Diabetes mellitus without complication (HCC)    Edema of lower extremity    Hyperlipidemia    Hypertension    Multiple sclerosis    Prediabetes    Restless legs    Past Surgical History:  Procedure Laterality Date   bone spur removal Right    BUNIONECTOMY Bilateral 2005   PARTIAL HYSTERECTOMY  2008   TUBAL LIGATION  1982   Patient Active Problem List   Diagnosis Date Noted   Cerebrovascular accident (CVA) due to occlusion of small artery (HCC) 10/30/2020   Numbness and tingling in right hand 10/30/2020   Acute stroke due to ischemia (HCC) 06/29/2020   Snoring 06/29/2020   Sleep apnea in adult 06/29/2020   Mild intermittent asthma 06/29/2020   Hypokalemia 04/24/2020   Lab test positive for detection of COVID-19 virus 04/24/2020   TIA (transient ischemic attack) 04/23/2020   Diverticulosis of colon 01/16/2018   Internal hemorrhoids 01/16/2018   Tubular adenoma 01/16/2018   Paresthesias 04/28/2017   Anxiety disorder 11/05/2016   Edema of lower extremity 11/05/2016   Essential hypertension 11/05/2016   Hyperlipidemia 11/05/2016   Prediabetes  11/05/2016   Restless legs 11/05/2016   Mild persistent asthma in adult without complication 12/12/2011    ONSET DATE: 2024  REFERRING DIAG: Hemiplegia  THERAPY DIAG:  Muscle weakness (generalized)  Difficulty in walking, not elsewhere classified  Unsteadiness on feet  Rationale for Evaluation and Treatment: Rehabilitation  SUBJECTIVE:   Per eval: Pt states she was diagnosed with MS 1 year ago. She does not see neurologist again until 03/2024. Pt worked with physical therapy at this clinic until 11/2022 and was discharged with HEP. She also incorporated water aerobics and was doing that until April of this year. She states that since April she has been extremely tired and fatigued and is only able to complete 1 out of the house task a day due to fatigue. She saw PCP a few weeks ago who recommended she resume PT.  She uses a rollator at all times for ambulation. She states that Rt LE is painful up to the hip and feels more weak than Lt. She also has pain in Rt UE and her Rt hand is cold all the time and tingles.  Her biggest complaint is Rt sided weakness and balance Pt accompanied by: self  SUBJECTIVE STATEMENT: 01/27/2024: Pt sees  neurology tomorrow. She states she still feels unsteady due to Rt knee buckling, but is overall more confident in her balance   PERTINENT HISTORY: MS  PAIN:  Are you having pain? Yes: NPRS scale: 0/10 currently, up to 4/10 at worst Pain location: Rt LE and Rt UE Pain description: uncomfortable Aggravating factors: first thing in the morning, prolonged activity Relieving factors: rest  PRECAUTIONS: Fall and Other: MS  RED FLAGS: None   WEIGHT BEARING RESTRICTIONS: No  FALLS: Has patient fallen in last 6 months? Yes. Number of falls 3-4, none with injury  LIVING ENVIRONMENT: Lives with:  lives alone Lives in: House/apartment Stairs: No Has following equipment at home: Walker - 4 wheeled  PLOF: Independent with household mobility with device  PATIENT GOALS: improve balance and Rt sided strength  OBJECTIVE:  Note: Objective measures were completed at Evaluation unless otherwise noted.  DIAGNOSTIC FINDINGS: MRI 2024: Findings consistent with the clinical diagnosis of multiple sclerosis. Compared to prior MRI, there is interval progression of the T2 hyperintense lesions in the bilateral corona radiata and genu of the corpus callosum. No focus of abnormal contrast enhancement to suggest active demyelination.  COGNITION: Overall cognitive status: Within functional limits for tasks assessed     COORDINATION: WFL for RAMS UE and LE    LOWER EXTREMITY MMT:    MMT Right Eval Left Eval  Hip flexion 3- 4  Hip extension    Hip abduction    Hip adduction    Hip internal rotation    Hip external rotation    Knee flexion 3 4  Knee extension 3+ 4+  Ankle dorsiflexion 3 4  Ankle plantarflexion    Ankle inversion    Ankle eversion    (Blank rows = not tested)   GAIT: Findings: Gait Characteristics: wide BOS, Distance walked: 100', Assistive device utilized:3 wheeled rollator, and Comments: slow cadence  FUNCTIONAL TESTS:  30 second chair rise = 3 with UE support TUG : 28.91 seconds with rollator BERG BALANCE TEST Sitting to Standing: 3.      Stands independently using hands Standing Unsupported: 3.      Stands 2 minutes with supervision Sitting Unsupported: 4.     Sits for 2 minutes independently Standing to Sitting: 2.     Uses back of legs against chair to control descent  Transfers: 3.     Transfers safely definite use of hands Standing with eyes closed: 3.     Stands 10 seconds with supervision Standing with feet together: 1.     Needs help to attain position but can hold for 15 seconds Reaching forward with outstretched arm: 1.     Reaches forward  with supervision Retrieving object from the floor: 0.     Unable/needs assistance to keep from falling Turning to look behind: 1.     Needs supervision when turning Turning 360 degrees: 0.     Needs assistance Place alternate foot on stool: 1.     Completes >2 steps with minimal assist Standing with one foot in front: 0.     Loses balance while standing/stepping Standing on one foot: 0.     Unable  Total Score: 22/56    01/22/24: - TUG: 21.70sec w rollator  TREATMENT:   [facility] Adult PT Treatment:                                                DATE: 01/27/24  Neuromuscular re-ed: BERG BALANCE TEST Sitting to Standing: 3.      Stands independently using hands Standing Unsupported: 4.      Stands safely for 2 minutes Sitting Unsupported: 4.     Sits for 2 minutes independently Standing to Sitting: 3.     Controls descent with hands  Transfers: 3.     Transfers safely definite use of hands Standing with eyes closed: 3.     Stands 10 seconds with supervision Standing with feet together: 1.     Needs help to attain position but can hold for 15 seconds Reaching forward with outstretched arm: 3.     Reaches forward 5 inches Retrieving object from the floor: 4.      Able to pick up easily and safely Turning to look behind: 3.     Looks behind one side only, other side less weight shift Turning 360 degrees: 1.     Needs supervision or verbal cueing Place alternate foot on stool: 1.     Completes >2 steps with minimal assist Standing with one foot in front: 1.     Needs help to step, but can hold for 15 seconds Standing on one foot: 1.     Holds <3 seconds  Total Score: 35/56  Therapeutic Activity: Step up 4'' step with Rt LE/Rt UE only Lateral step down 4'' step focus on Rt LE eccentric control Standing hip abd green TB x 10 Rt Standing hip ext green TB x 10  Rt Stance on Rt LE with with Lt hip circles on slider - difficult! Sit <> stand with Lt LE on 2'' step for Rt LE bias --> partial sit <> stand repetitions with cuing for neutral pelvis and hip hinge Stance on Rt LE with Lt LE tap up to 2'' step - CGA without UE support   [facility] Adult PT Treatment:                                                DATE: 01/22/24 Therapeutic Exercise: Foam roller assisted LAQ, seated, RLE x15 RB lateral stepping along mat 4 laps, BIL HHA assist cues for posture HEP discussion/education  Neuromuscular re-ed: Seated knee flex/ext w/ ball; 2x8 RLE cues for motor control Standing reactive RLE reaches (green, orange, red dots) 4x30sec increasing complexity   Therapeutic Activity: TUG x2 + education (second bout for activity tolerance) STS x5 from mat w/ UE support STS x5 from slightly raised mat w/o UE support cues for mechanics and trunk lean     [facility] Adult PT Treatment:                                                DATE: 01/20/24  Neuromuscular re-ed: LAQ in seated x10 each leg - VC for quad activation ; 5 sec hold with slow lowering x 5 R, L 4# 2 x 10 Seated knee  flexion with heel on slider x 10, then 10 with yellow band resistance (only partial range due to weakness) Seated marching x 10 R, 4# x 10 L Seated up and overs ( using strap as a line to move foot over ) x 10 R only Seated ball press for core activation x 10 5 sec holds SLS 2x30 sec with UE support Therapeutic Activity: Standing hamstring curls x8 righteg only with facilitation to right leg , x10 left hamstring curls  Lateral side stepping 4 laps (down an dback is 1) at TM Step ups on foam x 5 B and discussed correct procedure for stairs     [facility] Adult PT Treatment:                                                DATE: 01/09/24  Neuromuscular re-ed: LAQ in seated x20 each leg - VC for quad activation  Knee flexion isometrics on stability ball x10 both, x10 right only -neuromuscular re ed  right hamstring  Weight shifts in standing with UE support left to right x10  Seated ball press for core activation x5 Therapeutic Activity: Standing hamstring curls x8 righteg only with facilitation to right leg , x10 left hamstring curls  Lateral side stepping 4 laps (down an dback is 1) at counter   Self Care: Patient education for exercises to do in the car for long trip   [facility] Adult PT Treatment:                                                DATE: 01/02/24  Neuromuscular re-ed: Red  light / green light 1 lap CW/CCW each Theraband foam step up 2x12 w UE support  SLS RLE w/ UE support, 3x30sec tactile cues at quad/hamstring   Therapeutic Activity: Lateral stepping at treadmill 2 x 3 laps w UE support cues for clearance and posture Mini squat at treadmill w/ UE support 2x5 cues for comfortable ROM     [facility] Adult PT Treatment:                                                DATE: 12/31/23  Neuromuscular re-ed: Standing yoga block taps w/ UE support 2x10 BIL alt tactile cues for quad/hamstring co contraction in RLE stance Standing hip ext x8 BIL (tactile facilitation at R knee during stance) Standing hip ext x8 BIL tactile facilitation at quad   Therapeutic Activity: STS from raised mat 3x6 cues for weight shifting, guarding at knee but no blocking required  2 inch step up w/ tactile cues for knee mechanics; 2x10 RLE w UE support  Self Care: Education/discussion re: activity modification, pacing of tasks, breaking up HEP throughout day PRN, safety w/ standing activities and modification as indicated     PATIENT EDUCATION: Education details: rationale for interventions, HEP  Person educated: Patient Education method: Explanation, Demonstration, Tactile cues, Verbal cues Education comprehension: verbalized understanding, returned demonstration, verbal cues required, tactile cues required, and needs further education     HOME EXERCISE PROGRAM: Access Code: [email or web address] URL:  [email or web address] Date: 01/27/2024 Prepared by:  Darice Conine  Exercises - Sit to Stand with Armchair  - 1 x daily - 7 x weekly - 3 sets - 5 reps - Standing March with Counter Support  - 2-3 x daily - 7 x weekly - 1 sets - 5 reps - Side Stepping with Counter Support  - 2-3 x daily - 7 x weekly - 1 sets - 10 reps - Standing Hip Extension with Counter Support  - 1 x daily - 7 x weekly - 3 sets - 10 reps - Tandem Stance with Support  - 1 x daily - 7 x weekly - 1 sets - 3 reps - 20-30 seconds hold  GOALS: Goals reviewed with patient? Yes  SHORT TERM GOALS: Target date: 01/14/2024    Pt will be independent with initial HEP Baseline: 01/22/24: pt reports good HEP adherence Goal status: MET  2.  Pt will improve TUG to <= 24 seconds to demo improved functional mobility Baseline:  01/22/24: 21 sec w rollator   Goal status: MET   LONG TERM GOALS: Target date: 02/11/2024    Pt will be independent with advanced HEP including plan for community exercise Baseline:  Goal status: INITIAL  2.  Pt will improve Berg balance test to >=30/56 to demo improving balance Baseline:  Goal status: MET  3.  Pt will improve 30 second chair rise test to >= 6 with UE support Baseline:  Goal status: INITIAL  4.  Pt will improve Rt LE strength to 4/5 to improve functional mobility Baseline:  Goal status: INITIAL   ASSESSMENT:  CLINICAL IMPRESSION: 01/27/2024: pt has improved Berg balance test to 35/56 demonstrating decreased fall risk and improved balance. Pt continues with c/o knee giving out so treatment focused on knee stability in functional activities. Pt with most difficulty with Rt knee biased during stance. HEP updated     OBJECTIVE IMPAIRMENTS: Abnormal gait, decreased activity tolerance, difficulty walking, decreased strength, and impaired UE functional use.   ACTIVITY LIMITATIONS: standing, squatting, transfers, bathing, dressing, self feeding, and locomotion  level  PARTICIPATION LIMITATIONS: meal prep, cleaning, driving, and community activity  PERSONAL FACTORS: 1-2 comorbidities: history of TIA, possible MS are also affecting patient's functional outcome.   REHAB POTENTIAL: Good  CLINICAL DECISION MAKING: Evolving/moderate complexity  EVALUATION COMPLEXITY: Moderate  PLAN:  PT FREQUENCY: 2x/week  PT DURATION: 8 weeks  PLANNED INTERVENTIONS: [id number]- PT Re-evaluation, 97110-Therapeutic exercises, 97530- Therapeutic activity, [id number]- Neuromuscular re-education, 97535- Self Care, [id number]- Manual therapy, [id number]- Gait training, [phone]- Aquatic Therapy, Patient/Family education, Cryotherapy, and Moist heat  PLAN FOR NEXT SESSION: Gait and balance training, single limb, LAQ   Ranelle Auker, PT,DPT11/18/2511:02 AM  ``

## 2024-01-28 DIAGNOSIS — R29898 Other symptoms and signs involving the musculoskeletal system: Secondary | ICD-10-CM | POA: Diagnosis not present

## 2024-01-28 DIAGNOSIS — G959 Disease of spinal cord, unspecified: Secondary | ICD-10-CM | POA: Diagnosis not present

## 2024-01-28 DIAGNOSIS — Z8679 Personal history of other diseases of the circulatory system: Secondary | ICD-10-CM | POA: Diagnosis not present

## 2024-01-28 DIAGNOSIS — Z8639 Personal history of other endocrine, nutritional and metabolic disease: Secondary | ICD-10-CM | POA: Diagnosis not present

## 2024-01-28 DIAGNOSIS — R9089 Other abnormal findings on diagnostic imaging of central nervous system: Secondary | ICD-10-CM | POA: Diagnosis not present

## 2024-01-28 DIAGNOSIS — R269 Unspecified abnormalities of gait and mobility: Secondary | ICD-10-CM | POA: Diagnosis not present

## 2024-01-28 NOTE — Therapy (Signed)
 OUTPATIENT PHYSICAL THERAPY TREATMENT   Patient Name: Melinda Mckay MRN: [id number] DOB:November 20, 1945, 78 y.o., female Today's Date: 01/29/2024   PCP: Roanna Piedmont REFERRING PROVIDER: Roanna Piedmont Referring diagnosis: G81.91 (ICD-10-CM) - Hemiplegia, unspecified affecting right dominant side   END OF SESSION:  PT End of Session - 01/29/24 0931     Visit Number 9    Number of Visits 16    Date for Recertification  02/12/24    Authorization Type [employer] medicare    Progress Note Due on Visit 10    PT Start Time 0933    PT Stop Time 1014    PT Time Calculation (min) 41 min                  Past Medical History:  Diagnosis Date   Anxiety    Asthma    Diabetes mellitus without complication (HCC)    Edema of lower extremity    Hyperlipidemia    Hypertension    Multiple sclerosis    Prediabetes    Restless legs    Past Surgical History:  Procedure Laterality Date   bone spur removal Right    BUNIONECTOMY Bilateral 2005   PARTIAL HYSTERECTOMY  2008   TUBAL LIGATION  1982   Patient Active Problem List   Diagnosis Date Noted   Cerebrovascular accident (CVA) due to occlusion of small artery (HCC) 10/30/2020   Numbness and tingling in right hand 10/30/2020   Acute stroke due to ischemia (HCC) 06/29/2020   Snoring 06/29/2020   Sleep apnea in adult 06/29/2020   Mild intermittent asthma 06/29/2020   Hypokalemia 04/24/2020   Lab test positive for detection of COVID-19 virus 04/24/2020   TIA (transient ischemic attack) 04/23/2020   Diverticulosis of colon 01/16/2018   Internal hemorrhoids 01/16/2018   Tubular adenoma 01/16/2018   Paresthesias 04/28/2017   Anxiety disorder 11/05/2016   Edema of lower extremity 11/05/2016   Essential hypertension 11/05/2016   Hyperlipidemia 11/05/2016   Prediabetes 11/05/2016   Restless legs 11/05/2016   Mild persistent asthma in adult without complication 12/12/2011    ONSET DATE: 2024  REFERRING DIAG:  Hemiplegia  THERAPY DIAG:  Muscle weakness (generalized)  Difficulty in walking, not elsewhere classified  Unsteadiness on feet  Rationale for Evaluation and Treatment: Rehabilitation  SUBJECTIVE:   Per eval: Pt states she was diagnosed with MS 1 year ago. She does not see neurologist again until 03/2024. Pt worked with physical therapy at this clinic until 11/2022 and was discharged with HEP. She also incorporated water aerobics and was doing that until April of this year. She states that since April she has been extremely tired and fatigued and is only able to complete 1 out of the house task a day due to fatigue. She saw PCP a few weeks ago who recommended she resume PT.  She uses a rollator at all times for ambulation. She states that Rt LE is painful up to the hip and feels more weak than Lt. She also has pain in Rt UE and her Rt hand is cold all the time and tingles.  Her biggest complaint is Rt sided weakness and balance Pt accompanied by: self  SUBJECTIVE STATEMENT: 01/29/2024: had follow up with [facility] yesterday - states they are looking into cervical stenosis or myelopathy. States she is feeling optimistic about her options after their discussion. No other new updates    PERTINENT HISTORY: MS  PAIN:  Are you having pain? Yes: NPRS scale: 0/10 currently, up to 4/10 at worst Pain location: Rt LE and Rt UE Pain description: uncomfortable Aggravating factors: first thing in the morning, prolonged activity Relieving factors: rest  PRECAUTIONS: Fall and Other: MS  RED FLAGS: None   WEIGHT BEARING RESTRICTIONS: No  FALLS: Has patient fallen in last 6 months? Yes. Number of falls 3-4, none with injury  LIVING ENVIRONMENT: Lives with: lives alone Lives in: House/apartment Stairs: No Has following equipment at  home: Walker - 4 wheeled  PLOF: Independent with household mobility with device  PATIENT GOALS: improve balance and Rt sided strength  OBJECTIVE:  Note: Objective measures were completed at Evaluation unless otherwise noted.  DIAGNOSTIC FINDINGS: MRI 2024: Findings consistent with the clinical diagnosis of multiple sclerosis. Compared to prior MRI, there is interval progression of the T2 hyperintense lesions in the bilateral corona radiata and genu of the corpus callosum. No focus of abnormal contrast enhancement to suggest active demyelination.  COGNITION: Overall cognitive status: Within functional limits for tasks assessed     COORDINATION: WFL for RAMS UE and LE    LOWER EXTREMITY MMT:    MMT Right Eval Left Eval  Hip flexion 3- 4  Hip extension    Hip abduction    Hip adduction    Hip internal rotation    Hip external rotation    Knee flexion 3 4  Knee extension 3+ 4+  Ankle dorsiflexion 3 4  Ankle plantarflexion    Ankle inversion    Ankle eversion    (Blank rows = not tested)   GAIT: Findings: Gait Characteristics: wide BOS, Distance walked: 100', Assistive device utilized:3 wheeled rollator, and Comments: slow cadence  FUNCTIONAL TESTS:  30 second chair rise = 3 with UE support TUG : 28.91 seconds with rollator BERG BALANCE TEST Sitting to Standing: 3.      Stands independently using hands Standing Unsupported: 3.      Stands 2 minutes with supervision Sitting Unsupported: 4.     Sits for 2 minutes independently Standing to Sitting: 2.     Uses back of legs against chair to control descent  Transfers: 3.     Transfers safely definite use of hands Standing with eyes closed: 3.     Stands 10 seconds with supervision Standing with feet together: 1.     Needs help to attain position but can hold for 15 seconds Reaching forward with outstretched arm: 1.     Reaches forward with supervision Retrieving object from the floor: 0.     Unable/needs assistance  to keep from falling Turning to look behind: 1.     Needs supervision when turning Turning 360 degrees: 0.     Needs assistance Place alternate foot on stool: 1.     Completes >2 steps with minimal assist Standing with one foot in front: 0.     Loses balance while standing/stepping Standing on one foot: 0.     Unable  Total Score: 22/56    01/22/24: - TUG: 21.70sec w rollator    01/27/24: - BERG 35/56  TREATMENT:   [facility] Adult PT Treatment:                                                DATE: 01/29/24  Neuromuscular re-ed: Bosu tke push x10, x10 with 5sec hold cues for setup Airex static stance x30sec  Airex static stance 1 min with manual perturbations multidirectional CGA  Dynadisc seated fwd chest press x10  Dynadisc seated RB row, towel grip on handle 2x12   Self Care: Education/discussion re: updates w/ her recent neurology visit, goals with PT, symptom behavior, relevant anatomy/physiology    [facility] Adult PT Treatment:                                                DATE: 01/27/24  Neuromuscular re-ed: BERG BALANCE TEST Sitting to Standing: 3.      Stands independently using hands Standing Unsupported: 4.      Stands safely for 2 minutes Sitting Unsupported: 4.     Sits for 2 minutes independently Standing to Sitting: 3.     Controls descent with hands  Transfers: 3.     Transfers safely definite use of hands Standing with eyes closed: 3.     Stands 10 seconds with supervision Standing with feet together: 1.     Needs help to attain position but can hold for 15 seconds Reaching forward with outstretched arm: 3.     Reaches forward 5 inches Retrieving object from the floor: 4.      Able to pick up easily and safely Turning to look behind: 3.     Looks behind one side only, other side less weight shift Turning 360 degrees: 1.     Needs  supervision or verbal cueing Place alternate foot on stool: 1.     Completes >2 steps with minimal assist Standing with one foot in front: 1.     Needs help to step, but can hold for 15 seconds Standing on one foot: 1.     Holds <3 seconds  Total Score: 35/56  Therapeutic Activity: Step up 4'' step with Rt LE/Rt UE only Lateral step down 4'' step focus on Rt LE eccentric control Standing hip abd green TB x 10 Rt Standing hip ext green TB x 10 Rt Stance on Rt LE with with Lt hip circles on slider - difficult! Sit <> stand with Lt LE on 2'' step for Rt LE bias --> partial sit <> stand repetitions with cuing for neutral pelvis and hip hinge Stance on Rt LE with Lt LE tap up to 2'' step - CGA without UE support   [facility] Adult PT Treatment:                                                DATE: 01/22/24 Therapeutic Exercise: Foam roller assisted LAQ, seated, RLE x15 RB lateral stepping along mat 4 laps, BIL HHA assist cues for posture HEP discussion/education  Neuromuscular re-ed: Seated knee flex/ext w/ ball; 2x8 RLE cues for motor control Standing reactive RLE reaches (green, orange, red dots) 4x30sec increasing complexity  Therapeutic Activity: TUG x2 + education (second bout for activity tolerance) STS x5 from mat w/ UE support STS x5 from slightly raised mat w/o UE support cues for mechanics and trunk lean     [facility] Adult PT Treatment:                                                DATE: 01/20/24  Neuromuscular re-ed: LAQ in seated x10 each leg - VC for quad activation ; 5 sec hold with slow lowering x 5 R, L 4# 2 x 10 Seated knee flexion with heel on slider x 10, then 10 with yellow band resistance (only partial range due to weakness) Seated marching x 10 R, 4# x 10 L Seated up and overs ( using strap as a line to move foot over ) x 10 R only Seated ball press for core activation x 10 5 sec holds SLS 2x30 sec with UE support Therapeutic Activity: Standing hamstring curls x8  righteg only with facilitation to right leg , x10 left hamstring curls  Lateral side stepping 4 laps (down an dback is 1) at TM Step ups on foam x 5 B and discussed correct procedure for stairs     [facility] Adult PT Treatment:                                                DATE: 01/09/24  Neuromuscular re-ed: LAQ in seated x20 each leg - VC for quad activation  Knee flexion isometrics on stability ball x10 both, x10 right only -neuromuscular re ed right hamstring  Weight shifts in standing with UE support left to right x10  Seated ball press for core activation x5 Therapeutic Activity: Standing hamstring curls x8 righteg only with facilitation to right leg , x10 left hamstring curls  Lateral side stepping 4 laps (down an dback is 1) at counter   Self Care: Patient education for exercises to do in the car for long trip   [facility] Adult PT Treatment:                                                DATE: 01/02/24  Neuromuscular re-ed: Red  light / green light 1 lap CW/CCW each Theraband foam step up 2x12 w UE support  SLS RLE w/ UE support, 3x30sec tactile cues at quad/hamstring   Therapeutic Activity: Lateral stepping at treadmill 2 x 3 laps w UE support cues for clearance and posture Mini squat at treadmill w/ UE support 2x5 cues for comfortable ROM     PATIENT EDUCATION: Education details: rationale for interventions, HEP  Person educated: Patient Education method: Explanation, Demonstration, Tactile cues, Verbal cues Education comprehension: verbalized understanding, returned demonstration, verbal cues required, tactile cues required, and needs further education     HOME EXERCISE PROGRAM: Access Code: [email or web address] URL: [email or web address] Date: 01/27/2024 Prepared by: Darice Conine  Exercises - Sit to Stand with Armchair  - 1 x daily - 7 x weekly - 3 sets - 5 reps - Standing March with Counter Support  - 2-3 x daily - 7 x weekly -  1 sets - 5 reps - Side Stepping  with Counter Support  - 2-3 x daily - 7 x weekly - 1 sets - 10 reps - Standing Hip Extension with Counter Support  - 1 x daily - 7 x weekly - 3 sets - 10 reps - Tandem Stance with Support  - 1 x daily - 7 x weekly - 1 sets - 3 reps - 20-30 seconds hold  GOALS: Goals reviewed with patient? Yes  SHORT TERM GOALS: Target date: 01/14/2024    Pt will be independent with initial HEP Baseline: 01/22/24: pt reports good HEP adherence Goal status: MET  2.  Pt will improve TUG to <= 24 seconds to demo improved functional mobility Baseline:  01/22/24: 21 sec w rollator   Goal status: MET   LONG TERM GOALS: Target date: 02/11/2024    Pt will be independent with advanced HEP including plan for community exercise Baseline:  Goal status: INITIAL  2.  Pt will improve Berg balance test to >=30/56 to demo improving balance Baseline:  Goal status: MET  3.  Pt will improve 30 second chair rise test to >= 6 with UE support Baseline:  Goal status: INITIAL  4.  Pt will improve Rt LE strength to 4/5 to improve functional mobility Baseline:  Goal status: INITIAL   ASSESSMENT:  CLINICAL IMPRESSION: 01/29/2024: Pt arrives w/ no issues after last session. Much of session spent discussing pt update from neurology, goals with PT, her mobility/ADLs. Following this with exercises emphasizing postural stability in standing and sitting, reactive balance, core activation and gripping. No adverse events, tolerates well without pain. Recommend continuing along current POC in order to address relevant deficits and improve functional tolerance. Pt departs today's session in no acute distress, all voiced questions/concerns addressed appropriately from PT perspective.      OBJECTIVE IMPAIRMENTS: Abnormal gait, decreased activity tolerance, difficulty walking, decreased strength, and impaired UE functional use.   ACTIVITY LIMITATIONS: standing, squatting, transfers, bathing, dressing, self feeding, and  locomotion level  PARTICIPATION LIMITATIONS: meal prep, cleaning, driving, and community activity  PERSONAL FACTORS: 1-2 comorbidities: history of TIA, possible MS are also affecting patient's functional outcome.   REHAB POTENTIAL: Good  CLINICAL DECISION MAKING: Evolving/moderate complexity  EVALUATION COMPLEXITY: Moderate  PLAN:  PT FREQUENCY: 2x/week  PT DURATION: 8 weeks  PLANNED INTERVENTIONS: [id number]- PT Re-evaluation, 97110-Therapeutic exercises, 97530- Therapeutic activity, [id number]- Neuromuscular re-education, 97535- Self Care, [id number]- Manual therapy, [id number]- Gait training, [phone]- Aquatic Therapy, Patient/Family education, Cryotherapy, and Moist heat  PLAN FOR NEXT SESSION: Gait and balance training, single limb, LAQ. Progress note 10th visit   Alm DELENA Jenny PT, DPT 01/29/2024 12:57 PM

## 2024-01-29 ENCOUNTER — Ambulatory Visit: Admitting: Physical Therapy

## 2024-01-29 ENCOUNTER — Encounter: Payer: Self-pay | Admitting: Physical Therapy

## 2024-01-29 DIAGNOSIS — M6281 Muscle weakness (generalized): Secondary | ICD-10-CM | POA: Diagnosis not present

## 2024-01-29 DIAGNOSIS — R262 Difficulty in walking, not elsewhere classified: Secondary | ICD-10-CM

## 2024-01-29 DIAGNOSIS — R2681 Unsteadiness on feet: Secondary | ICD-10-CM | POA: Diagnosis not present

## 2024-02-02 NOTE — Therapy (Signed)
 OUTPATIENT PHYSICAL THERAPY PROGRESS NOTE + TREATMENT   Patient Name: Melinda Mckay MRN: [id number] DOB:09-26-1945, 78 y.o., female Today's Date: 02/02/2024   PCP: Roanna Piedmont REFERRING PROVIDER: Roanna Piedmont Referring diagnosis: G81.91 (ICD-10-CM) - Hemiplegia, unspecified affecting right dominant side    Progress Note Reporting Period 12/17/23 to 02/03/24  See note below for Objective Data and Assessment of Progress/Goals.      END OF SESSION:            Past Medical History:  Diagnosis Date   Anxiety    Asthma    Diabetes mellitus without complication (HCC)    Edema of lower extremity    Hyperlipidemia    Hypertension    Multiple sclerosis    Prediabetes    Restless legs    Past Surgical History:  Procedure Laterality Date   bone spur removal Right    BUNIONECTOMY Bilateral 2005   PARTIAL HYSTERECTOMY  2008   TUBAL LIGATION  1982   Patient Active Problem List   Diagnosis Date Noted   Cerebrovascular accident (CVA) due to occlusion of small artery (HCC) 10/30/2020   Numbness and tingling in right hand 10/30/2020   Acute stroke due to ischemia (HCC) 06/29/2020   Snoring 06/29/2020   Sleep apnea in adult 06/29/2020   Mild intermittent asthma 06/29/2020   Hypokalemia 04/24/2020   Lab test positive for detection of COVID-19 virus 04/24/2020   TIA (transient ischemic attack) 04/23/2020   Diverticulosis of colon 01/16/2018   Internal hemorrhoids 01/16/2018   Tubular adenoma 01/16/2018   Paresthesias 04/28/2017   Anxiety disorder 11/05/2016   Edema of lower extremity 11/05/2016   Essential hypertension 11/05/2016   Hyperlipidemia 11/05/2016   Prediabetes 11/05/2016   Restless legs 11/05/2016   Mild persistent asthma in adult without complication 12/12/2011    ONSET DATE: 2024  REFERRING DIAG: Hemiplegia  THERAPY DIAG:  No diagnosis found.  Rationale for Evaluation and Treatment: Rehabilitation  SUBJECTIVE:   Per eval: Pt  states she was diagnosed with MS 1 year ago. She does not see neurologist again until 03/2024. Pt worked with physical therapy at this clinic until 11/2022 and was discharged with HEP. She also incorporated water aerobics and was doing that until April of this year. She states that since April she has been extremely tired and fatigued and is only able to complete 1 out of the house task a day due to fatigue. She saw PCP a few weeks ago who recommended she resume PT.  She uses a rollator at all times for ambulation. She states that Rt LE is painful up to the hip and feels more weak than Lt. She also has pain in Rt UE and her Rt hand is cold all the time and tingles.  Her biggest complaint is Rt sided weakness and balance Pt accompanied by: self  SUBJECTIVE STATEMENT: 02/02/2024: ***  *** had follow up with [facility] yesterday - states they are looking into cervical stenosis or myelopathy. States she is feeling optimistic about her options after their discussion. No other new updates    PERTINENT HISTORY: MS  PAIN:  Are you having pain? Yes: NPRS scale: 0/10 currently, up to 4/10 at worst Pain location: Rt LE and Rt UE Pain description: uncomfortable Aggravating factors: first thing in the morning, prolonged activity Relieving factors: rest  PRECAUTIONS: Fall and Other: MS  RED FLAGS: None   WEIGHT BEARING RESTRICTIONS: No  FALLS: Has patient fallen in last 6 months? Yes. Number of falls 3-4, none with injury  LIVING ENVIRONMENT: Lives with: lives alone Lives in: House/apartment Stairs: No Has following equipment at home: Walker - 4 wheeled  PLOF: Independent with household mobility with device  PATIENT GOALS: improve balance and Rt sided strength  OBJECTIVE:  Note: Objective measures were completed at Evaluation unless  otherwise noted.  DIAGNOSTIC FINDINGS: MRI 2024: Findings consistent with the clinical diagnosis of multiple sclerosis. Compared to prior MRI, there is interval progression of the T2 hyperintense lesions in the bilateral corona radiata and genu of the corpus callosum. No focus of abnormal contrast enhancement to suggest active demyelination.  COGNITION: Overall cognitive status: Within functional limits for tasks assessed     COORDINATION: WFL for RAMS UE and LE    LOWER EXTREMITY MMT:    MMT Right Eval Left Eval R/L 02/03/24 ***   Hip flexion 3- 4   Hip extension     Hip abduction     Hip adduction     Hip internal rotation     Hip external rotation     Knee flexion 3 4   Knee extension 3+ 4+   Ankle dorsiflexion 3 4   Ankle plantarflexion     Ankle inversion     Ankle eversion     (Blank rows = not tested)   GAIT: Findings: Gait Characteristics: wide BOS, Distance walked: 100', Assistive device utilized:3 wheeled rollator, and Comments: slow cadence  FUNCTIONAL TESTS:  30 second chair rise = 3 with UE support TUG : 28.91 seconds with rollator BERG BALANCE TEST Sitting to Standing: 3.      Stands independently using hands Standing Unsupported: 3.      Stands 2 minutes with supervision Sitting Unsupported: 4.     Sits for 2 minutes independently Standing to Sitting: 2.     Uses back of legs against chair to control descent  Transfers: 3.     Transfers safely definite use of hands Standing with eyes closed: 3.     Stands 10 seconds with supervision Standing with feet together: 1.     Needs help to attain position but can hold for 15 seconds Reaching forward with outstretched arm: 1.     Reaches forward with supervision Retrieving object from the floor: 0.     Unable/needs assistance to keep from falling Turning to look behind: 1.     Needs supervision when turning Turning 360 degrees: 0.     Needs assistance Place alternate foot on stool: 1.     Completes >2  steps with minimal assist Standing with one foot in front: 0.     Loses balance while standing/stepping Standing on one foot: 0.     Unable  Total Score: 22/56    01/22/24: - TUG: 21.70sec w rollator    01/27/24: - BERG 35/56  02/03/24: - 30sec STS ***  TREATMENT:   [facility] Adult PT Treatment:                                                DATE: 02/03/24 Therapeutic Exercise: *** Manual Therapy: *** Neuromuscular re-ed: *** Therapeutic Activity: *** Modalities: *** Self Care: ***    [facility] Adult PT Treatment:                                                DATE: 01/29/24  Neuromuscular re-ed: Bosu tke push x10, x10 with 5sec hold cues for setup Airex static stance x30sec  Airex static stance 1 min with manual perturbations multidirectional CGA  Dynadisc seated fwd chest press x10  Dynadisc seated RB row, towel grip on handle 2x12   Self Care: Education/discussion re: updates w/ her recent neurology visit, goals with PT, symptom behavior, relevant anatomy/physiology    [facility] Adult PT Treatment:                                                DATE: 01/27/24  Neuromuscular re-ed: BERG BALANCE TEST Sitting to Standing: 3.      Stands independently using hands Standing Unsupported: 4.      Stands safely for 2 minutes Sitting Unsupported: 4.     Sits for 2 minutes independently Standing to Sitting: 3.     Controls descent with hands  Transfers: 3.     Transfers safely definite use of hands Standing with eyes closed: 3.     Stands 10 seconds with supervision Standing with feet together: 1.     Needs help to attain position but can hold for 15 seconds Reaching forward with outstretched arm: 3.     Reaches forward 5 inches Retrieving object from the floor: 4.      Able to pick up easily and safely Turning to look behind: 3.     Looks behind one  side only, other side less weight shift Turning 360 degrees: 1.     Needs supervision or verbal cueing Place alternate foot on stool: 1.     Completes >2 steps with minimal assist Standing with one foot in front: 1.     Needs help to step, but can hold for 15 seconds Standing on one foot: 1.     Holds <3 seconds  Total Score: 35/56  Therapeutic Activity: Step up 4'' step with Rt LE/Rt UE only Lateral step down 4'' step focus on Rt LE eccentric control Standing hip abd green TB x 10 Rt Standing hip ext green TB x 10 Rt Stance on Rt LE with with Lt hip circles on slider - difficult! Sit <> stand with Lt LE on 2'' step for Rt LE bias --> partial sit <> stand repetitions with cuing for neutral pelvis and hip hinge Stance on Rt LE with Lt LE tap up to 2'' step - CGA without UE support   [facility] Adult PT Treatment:  DATE: 01/22/24 Therapeutic Exercise: Foam roller assisted LAQ, seated, RLE x15 RB lateral stepping along mat 4 laps, BIL HHA assist cues for posture HEP discussion/education  Neuromuscular re-ed: Seated knee flex/ext w/ ball; 2x8 RLE cues for motor control Standing reactive RLE reaches (green, orange, red dots) 4x30sec increasing complexity   Therapeutic Activity: TUG x2 + education (second bout for activity tolerance) STS x5 from mat w/ UE support STS x5 from slightly raised mat w/o UE support cues for mechanics and trunk lean     [facility] Adult PT Treatment:                                                DATE: 01/20/24  Neuromuscular re-ed: LAQ in seated x10 each leg - VC for quad activation ; 5 sec hold with slow lowering x 5 R, L 4# 2 x 10 Seated knee flexion with heel on slider x 10, then 10 with yellow band resistance (only partial range due to weakness) Seated marching x 10 R, 4# x 10 L Seated up and overs ( using strap as a line to move foot over ) x 10 R only Seated ball press for core activation x 10 5 sec holds SLS  2x30 sec with UE support Therapeutic Activity: Standing hamstring curls x8 righteg only with facilitation to right leg , x10 left hamstring curls  Lateral side stepping 4 laps (down an dback is 1) at TM Step ups on foam x 5 B and discussed correct procedure for stairs     [facility] Adult PT Treatment:                                                DATE: 01/09/24  Neuromuscular re-ed: LAQ in seated x20 each leg - VC for quad activation  Knee flexion isometrics on stability ball x10 both, x10 right only -neuromuscular re ed right hamstring  Weight shifts in standing with UE support left to right x10  Seated ball press for core activation x5 Therapeutic Activity: Standing hamstring curls x8 righteg only with facilitation to right leg , x10 left hamstring curls  Lateral side stepping 4 laps (down an dback is 1) at counter   Self Care: Patient education for exercises to do in the car for long trip   [facility] Adult PT Treatment:                                                DATE: 01/02/24  Neuromuscular re-ed: Red  light / green light 1 lap CW/CCW each Theraband foam step up 2x12 w UE support  SLS RLE w/ UE support, 3x30sec tactile cues at quad/hamstring   Therapeutic Activity: Lateral stepping at treadmill 2 x 3 laps w UE support cues for clearance and posture Mini squat at treadmill w/ UE support 2x5 cues for comfortable ROM     PATIENT EDUCATION: Education details: rationale for interventions, HEP  Person educated: Patient Education method: Explanation, Demonstration, Tactile cues, Verbal cues Education comprehension: verbalized understanding, returned demonstration, verbal cues required, tactile cues required, and needs further education  HOME EXERCISE PROGRAM: Access Code: [email or web address] URL: [email or web address] Date: 01/27/2024 Prepared by: Darice Conine  Exercises - Sit to Stand with Armchair  - 1 x daily - 7 x weekly - 3 sets - 5 reps - Standing March with  Counter Support  - 2-3 x daily - 7 x weekly - 1 sets - 5 reps - Side Stepping with Counter Support  - 2-3 x daily - 7 x weekly - 1 sets - 10 reps - Standing Hip Extension with Counter Support  - 1 x daily - 7 x weekly - 3 sets - 10 reps - Tandem Stance with Support  - 1 x daily - 7 x weekly - 1 sets - 3 reps - 20-30 seconds hold  GOALS: Goals reviewed with patient? Yes  SHORT TERM GOALS: Target date: 01/14/2024    Pt will be independent with initial HEP Baseline: 01/22/24: pt reports good HEP adherence Goal status: MET  2.  Pt will improve TUG to <= 24 seconds to demo improved functional mobility Baseline:  01/22/24: 21 sec w rollator   Goal status: MET   LONG TERM GOALS: Target date: 02/11/2024    Pt will be independent with advanced HEP including plan for community exercise Baseline:  02/03/24: *** Goal status: ***  2.  Pt will improve Berg balance test to >=30/56 to demo improving balance Baseline:  Goal status: MET  3.  Pt will improve 30 second chair rise test to >= 6 with UE support Baseline:  02/03/24: *** Goal status: ***  4.  Pt will improve Rt LE strength to 4/5 to improve functional mobility Baseline:  02/03/24: *** Goal status: ***   ASSESSMENT:  CLINICAL IMPRESSION: 02/02/2024: ***  *** Pt arrives w/ no issues after last session. Much of session spent discussing pt update from neurology, goals with PT, her mobility/ADLs. Following this with exercises emphasizing postural stability in standing and sitting, reactive balance, core activation and gripping. No adverse events, tolerates well without pain. Recommend continuing along current POC in order to address relevant deficits and improve functional tolerance. Pt departs today's session in no acute distress, all voiced questions/concerns addressed appropriately from PT perspective.      OBJECTIVE IMPAIRMENTS: Abnormal gait, decreased activity tolerance, difficulty walking, decreased strength, and  impaired UE functional use.   ACTIVITY LIMITATIONS: standing, squatting, transfers, bathing, dressing, self feeding, and locomotion level  PARTICIPATION LIMITATIONS: meal prep, cleaning, driving, and community activity  PERSONAL FACTORS: 1-2 comorbidities: history of TIA, possible MS are also affecting patient's functional outcome.   REHAB POTENTIAL: Good  CLINICAL DECISION MAKING: Evolving/moderate complexity  EVALUATION COMPLEXITY: Moderate  PLAN:  PT FREQUENCY: 2x/week  PT DURATION: 8 weeks  PLANNED INTERVENTIONS: [id number]- PT Re-evaluation, 97110-Therapeutic exercises, 97530- Therapeutic activity, [id number]- Neuromuscular re-education, 97535- Self Care, [id number]- Manual therapy, [id number]- Gait training, [phone]- Aquatic Therapy, Patient/Family education, Cryotherapy, and Moist heat  PLAN FOR NEXT SESSION: Gait and balance training, single limb, LAQ. Progress note 10th visit   Alm DELENA Jenny PT, DPT 02/02/2024 11:35 AM

## 2024-02-03 ENCOUNTER — Ambulatory Visit: Admitting: Physical Therapy

## 2024-02-03 ENCOUNTER — Encounter: Payer: Self-pay | Admitting: Physical Therapy

## 2024-02-03 DIAGNOSIS — M6281 Muscle weakness (generalized): Secondary | ICD-10-CM | POA: Diagnosis not present

## 2024-02-03 DIAGNOSIS — R262 Difficulty in walking, not elsewhere classified: Secondary | ICD-10-CM

## 2024-02-03 DIAGNOSIS — R2681 Unsteadiness on feet: Secondary | ICD-10-CM | POA: Diagnosis not present

## 2024-02-10 ENCOUNTER — Ambulatory Visit: Attending: Internal Medicine | Admitting: Physical Therapy

## 2024-02-10 ENCOUNTER — Encounter: Payer: Self-pay | Admitting: Physical Therapy

## 2024-02-10 DIAGNOSIS — R262 Difficulty in walking, not elsewhere classified: Secondary | ICD-10-CM | POA: Insufficient documentation

## 2024-02-10 DIAGNOSIS — R2681 Unsteadiness on feet: Secondary | ICD-10-CM | POA: Insufficient documentation

## 2024-02-10 DIAGNOSIS — M6281 Muscle weakness (generalized): Secondary | ICD-10-CM | POA: Insufficient documentation

## 2024-02-10 NOTE — Therapy (Signed)
 OUTPATIENT PHYSICAL THERAPY TREATMENT   Patient Name: Melinda Mckay MRN: [id number] DOB:1945-12-03, 78 y.o., female Today's Date: 02/10/2024   PCP: Roanna Piedmont REFERRING PROVIDER: Roanna Piedmont Referring diagnosis: G81.91 (ICD-10-CM) - Hemiplegia, unspecified affecting right dominant side      END OF SESSION:  PT End of Session - 02/10/24 1015     Visit Number 11    Number of Visits 22    Date for Recertification  03/16/24    Authorization Type BCBS medicare    Progress Note Due on Visit 20    PT Start Time 1016    PT Stop Time 1058    PT Time Calculation (min) 42 min           Past Medical History:  Diagnosis Date   Anxiety    Asthma    Diabetes mellitus without complication (HCC)    Edema of lower extremity    Hyperlipidemia    Hypertension    Multiple sclerosis    Prediabetes    Restless legs    Past Surgical History:  Procedure Laterality Date   bone spur removal Right    BUNIONECTOMY Bilateral 2005   PARTIAL HYSTERECTOMY  2008   TUBAL LIGATION  1982   Patient Active Problem List   Diagnosis Date Noted   Cerebrovascular accident (CVA) due to occlusion of small artery (HCC) 10/30/2020   Numbness and tingling in right hand 10/30/2020   Acute stroke due to ischemia (HCC) 06/29/2020   Snoring 06/29/2020   Sleep apnea in adult 06/29/2020   Mild intermittent asthma 06/29/2020   Hypokalemia 04/24/2020   Lab test positive for detection of COVID-19 virus 04/24/2020   TIA (transient ischemic attack) 04/23/2020   Diverticulosis of colon 01/16/2018   Internal hemorrhoids 01/16/2018   Tubular adenoma 01/16/2018   Paresthesias 04/28/2017   Anxiety disorder 11/05/2016   Edema of lower extremity 11/05/2016   Essential hypertension 11/05/2016   Hyperlipidemia 11/05/2016   Prediabetes 11/05/2016   Restless legs 11/05/2016   Mild persistent asthma in adult without complication 12/12/2011    ONSET DATE: 2024  REFERRING DIAG:  Hemiplegia  THERAPY DIAG:  Muscle weakness (generalized)  Difficulty in walking, not elsewhere classified  Rationale for Evaluation and Treatment: Rehabilitation  SUBJECTIVE:   Per eval: Pt states she was diagnosed with MS 1 year ago. She does not see neurologist again until 03/2024. Pt worked with physical therapy at this clinic until 11/2022 and was discharged with HEP. She also incorporated water aerobics and was doing that until April of this year. She states that since April she has been extremely tired and fatigued and is only able to complete 1 out of the house task a day due to fatigue. She saw PCP a few weeks ago who recommended she resume PT.  She uses a rollator at all times for ambulation. She states that Rt LE is painful up to the hip and feels more weak than Lt. She also has pain in Rt UE and her Rt hand is cold all the time and tingles.  Her biggest complaint is Rt sided weakness and balance Pt accompanied by: self  SUBJECTIVE STATEMENT: 02/10/2024: a bit of knee stiffness today. Busy with the holiday and wasn't able to do exercise quite as much, but was pretty active overall. No other new updates.   Next physician visits: PCP Dec 9th, [facility] Jan 19th   PERTINENT HISTORY: MS  PAIN:  Are you having pain? Yes: NPRS scale: 0/10 currently, up to 4/10 at worst Pain location: Rt LE and Rt UE Pain description: uncomfortable Aggravating factors: first thing in the morning, prolonged activity Relieving factors: rest  PRECAUTIONS: Fall and Other: MS  RED FLAGS: None   WEIGHT BEARING RESTRICTIONS: No  FALLS: Has patient fallen in last 6 months? Yes. Number of falls 3-4, none with injury  LIVING ENVIRONMENT: Lives with: lives alone Lives in: House/apartment Stairs: No Has following equipment at home: Walker -  4 wheeled  PLOF: Independent with household mobility with device  PATIENT GOALS: improve balance and Rt sided strength  OBJECTIVE:  Note: Objective measures were completed at Evaluation unless otherwise noted.  DIAGNOSTIC FINDINGS: MRI 2024: Findings consistent with the clinical diagnosis of multiple sclerosis. Compared to prior MRI, there is interval progression of the T2 hyperintense lesions in the bilateral corona radiata and genu of the corpus callosum. No focus of abnormal contrast enhancement to suggest active demyelination.  COGNITION: Overall cognitive status: Within functional limits for tasks assessed   COORDINATION: WFL for RAMS UE and LE    LOWER EXTREMITY MMT:    MMT Right Eval Left Eval R/L 02/03/24    Hip flexion 3- 4 3+/4  Hip extension     Hip abduction     Hip adduction     Hip internal rotation     Hip external rotation     Knee flexion 3 4 4-/4+  Knee extension 3+ 4+ 4-/4+  Ankle dorsiflexion 3 4 3+ within available ROM / 4  Ankle plantarflexion     Ankle inversion     Ankle eversion     (Blank rows = not tested)   GAIT: Findings: Gait Characteristics: wide BOS, Distance walked: 100', Assistive device utilized:3 wheeled rollator, and Comments: slow cadence  FUNCTIONAL TESTS:  30 second chair rise = 3 with UE support TUG : 28.91 seconds with rollator BERG BALANCE TEST Sitting to Standing: 3.      Stands independently using hands Standing Unsupported: 3.      Stands 2 minutes with supervision Sitting Unsupported: 4.     Sits for 2 minutes independently Standing to Sitting: 2.     Uses back of legs against chair to control descent  Transfers: 3.     Transfers safely definite use of hands Standing with eyes closed: 3.     Stands 10 seconds with supervision Standing with feet together: 1.     Needs help to attain position but can hold for 15 seconds Reaching forward with outstretched arm: 1.     Reaches forward with supervision Retrieving  object from the floor: 0.     Unable/needs assistance to keep from falling Turning to look behind: 1.     Needs supervision when turning Turning 360 degrees: 0.     Needs assistance Place alternate foot on stool: 1.     Completes >2 steps with minimal assist Standing with one foot in front: 0.     Loses balance while standing/stepping Standing on one foot: 0.     Unable  Total Score: 22/56    01/22/24: - TUG: 21.70sec w rollator    01/27/24: -  BERG 35/56  02/03/24: - 30sec STS 6 reps w/ UE support  - TUG 23 sec initially (difficult ascent with rollator); 20.30sec w rollator                                                                                                                                 TREATMENT:   [facility] Adult PT Treatment:                                                DATE: 02/10/24  Neuromuscular re-ed: Partial tandem stance 2x30sec BIL no UE support, CGA  Standing RB TKE x10 Standing RB TKE w 5 sec hold x8 2# towel grip seated chest press on dynadisc x12  Therapeutic Activity: 2# short lever STS from raised mat 3x5 4 inch fwd step up x12 BIL w UE support Education/discussion re: safety w/ weight shifting on step, appropriate home setup      [facility] Adult PT Treatment:                                                DATE: 02/03/24  Neuromuscular re-ed: 2# BIL shoulder raise w/ towel (increasing grip), seated; 3x6 cues for posture and control Static stance + UE reach fwd/side x12 BIL CGA for safety Static stance + vertical ball toss x15 cues for amplitude and posture, CGA for safety   Therapeutic Activity: MSK assessment + education 30sec STS + education TUG x2 + education Education/discussion re: progress with PT, symptom behavior as it affects activity tolerance, PT goals/POC      PATIENT EDUCATION: Education details: rationale for interventions, HEP  Person educated: Patient Education method: Explanation, Demonstration, Tactile cues, Verbal  cues Education comprehension: verbalized understanding, returned demonstration, verbal cues required, tactile cues required, and needs further education     HOME EXERCISE PROGRAM: Access Code: [email or web address] URL: [email or web address] Date: 02/10/2024 Prepared by: Alm Jenny  Exercises - Sit to Stand with Armchair  - 1 x daily - 7 x weekly - 3 sets - 5 reps - Side Stepping with Counter Support  - 2-3 x daily - 7 x weekly - 1 sets - 10 reps - Standing Hip Extension with Counter Support  - 1 x daily - 7 x weekly - 3 sets - 10 reps - Tandem Stance with Support  - 1 x daily - 7 x weekly - 1 sets - 3 reps - 20-30 seconds hold - Forward Step Up with Counter Support  - 1 x daily - 7 x weekly - 2-3 sets - 6-8 reps - Standing March with Counter Support  - 2-3 x daily - 7 x weekly - 1 sets - 8 reps  GOALS: Goals reviewed with patient? Yes  SHORT TERM GOALS: Target date: 01/14/2024    Pt will be independent with initial HEP Baseline: 01/22/24: pt reports good HEP adherence Goal status: MET  2.  Pt will improve TUG to <= 24 seconds to demo improved functional mobility Baseline:  01/22/24: 21 sec w rollator   Goal status: MET   LONG TERM GOALS: Target date: 03/16/2024 (updated 02/03/24)    Pt will be independent with advanced HEP including plan for community exercise Baseline:  02/03/24: HEP going well at home per pt report Goal status: PROGRESSING  2.  Pt will improve Berg balance test to >=30/56 to demo improving balance Baseline:  Goal status: MET  3.  Pt will improve 30 second chair rise test to >= 9 reps with UE support Baseline:  02/03/24: 6 reps w/ UE support (MET INITIAL GOAL of 6 reps) Goal status: UPDATED/NEW 02/03/24  4.  Pt will improve Rt LE strength to 4/5 to improve functional mobility Baseline:  02/03/24: see MMT chart above Goal status: PROGRESSING  5. Pt will be able to perform TUG w/ LRAD in less than or equal to 16 sec in order to facilitate  reduced fall risk (13.5sec cutoff score).  Baseline: 20.30sec w rollator  Goal status: INITIAL/NEW 02/03/24   ASSESSMENT:  CLINICAL IMPRESSION: 02/10/2024: Pt arrives w/ report of continued slow/steady progress, some stiffness this morning. Today continuing to work on functional/postural strengthening, closed chain quad stability. No adverse events, tolerates activity well. HEP update as above, increased time discussing safe setup at home with weight shifts on step. Recommend continuing along current POC in order to address relevant deficits and improve functional tolerance. Pt departs today's session in no acute distress, all voiced questions/concerns addressed appropriately from PT perspective.      OBJECTIVE IMPAIRMENTS: Abnormal gait, decreased activity tolerance, difficulty walking, decreased strength, and impaired UE functional use.   ACTIVITY LIMITATIONS: standing, squatting, transfers, bathing, dressing, self feeding, and locomotion level  PARTICIPATION LIMITATIONS: meal prep, cleaning, driving, and community activity  PERSONAL FACTORS: 1-2 comorbidities: history of TIA, possible MS are also affecting patient's functional outcome.   REHAB POTENTIAL: Good  CLINICAL DECISION MAKING: Evolving/moderate complexity  EVALUATION COMPLEXITY: Moderate  PLAN: (updated 02/03/24)  PT FREQUENCY: 2x/week  PT DURATION: 6 weeks  PLANNED INTERVENTIONS: 97164- PT Re-evaluation, 97110-Therapeutic exercises, 97530- Therapeutic activity, [id number]- Neuromuscular re-education, 97535- Self Care, [id number]- Manual therapy, [id number]- Gait training, [phone]- Aquatic Therapy, Patient/Family education, Cryotherapy, and Moist heat  PLAN FOR NEXT SESSION: Gait and balance training, single limb, LAQ.   Alm DELENA Jenny PT, DPT 02/10/2024 12:45 PM

## 2024-02-12 DIAGNOSIS — E559 Vitamin D deficiency, unspecified: Secondary | ICD-10-CM | POA: Diagnosis not present

## 2024-02-12 DIAGNOSIS — E1165 Type 2 diabetes mellitus with hyperglycemia: Secondary | ICD-10-CM | POA: Diagnosis not present

## 2024-02-12 DIAGNOSIS — I10 Essential (primary) hypertension: Secondary | ICD-10-CM | POA: Diagnosis not present

## 2024-02-12 DIAGNOSIS — E785 Hyperlipidemia, unspecified: Secondary | ICD-10-CM | POA: Diagnosis not present

## 2024-02-13 ENCOUNTER — Ambulatory Visit: Admitting: Physical Therapy

## 2024-02-13 ENCOUNTER — Encounter: Payer: Self-pay | Admitting: Physical Therapy

## 2024-02-13 DIAGNOSIS — M6281 Muscle weakness (generalized): Secondary | ICD-10-CM | POA: Diagnosis not present

## 2024-02-13 NOTE — Therapy (Signed)
 OUTPATIENT PHYSICAL THERAPY TREATMENT   Patient Name: Melinda Mckay MRN: [id number] DOB:[birth year], 78 y.o., female Today's Date: 02/13/2024   PCP: Roanna Piedmont REFERRING PROVIDER: Roanna Piedmont Referring diagnosis: G81.91 (ICD-10-CM) - Hemiplegia, unspecified affecting right dominant side      END OF SESSION:  PT End of Session - 02/13/24 1035     Visit Number 12    Number of Visits 22    Date for Recertification  03/16/24    Authorization Type BCBS medicare    Authorization - Visit Number 9    Progress Note Due on Visit 20    PT Start Time 1015    PT Stop Time 1058    PT Time Calculation (min) 43 min    Activity Tolerance Patient tolerated treatment well    Behavior During Therapy WFL for tasks assessed/performed            Past Medical History:  Diagnosis Date   Anxiety    Asthma    Diabetes mellitus without complication (HCC)    Edema of lower extremity    Hyperlipidemia    Hypertension    Multiple sclerosis    Prediabetes    Restless legs    Past Surgical History:  Procedure Laterality Date   bone spur removal Right    BUNIONECTOMY Bilateral 2005   PARTIAL HYSTERECTOMY  2008   TUBAL LIGATION  1982   Patient Active Problem List   Diagnosis Date Noted   Cerebrovascular accident (CVA) due to occlusion of small artery (HCC) 10/30/2020   Numbness and tingling in right hand 10/30/2020   Acute stroke due to ischemia (HCC) 06/29/2020   Snoring 06/29/2020   Sleep apnea in adult 06/29/2020   Mild intermittent asthma 06/29/2020   Hypokalemia 04/24/2020   Lab test positive for detection of COVID-19 virus 04/24/2020   TIA (transient ischemic attack) 04/23/2020   Diverticulosis of colon 01/16/2018   Internal hemorrhoids 01/16/2018   Tubular adenoma 01/16/2018   Paresthesias 04/28/2017   Anxiety disorder 11/05/2016   Edema of lower extremity 11/05/2016   Essential hypertension 11/05/2016   Hyperlipidemia 11/05/2016   Prediabetes 11/05/2016    Restless legs 11/05/2016   Mild persistent asthma in adult without complication 12/12/2011    ONSET DATE: 2024  REFERRING DIAG: Hemiplegia  THERAPY DIAG:  Muscle weakness (generalized)  Rationale for Evaluation and Treatment: Rehabilitation  SUBJECTIVE:   Per eval: Pt states she was diagnosed with MS 1 year ago. She does not see neurologist again until 03/2024. Pt worked with physical therapy at this clinic until 11/2022 and was discharged with HEP. She also incorporated water aerobics and was doing that until April of this year. She states that since April she has been extremely tired and fatigued and is only able to complete 1 out of the house task a day due to fatigue. She saw PCP a few weeks ago who recommended she resume PT.  She uses a rollator at all times for ambulation. She states that Rt LE is painful up to the hip and feels more weak than Lt. She also has pain in Rt UE and her Rt hand is cold all the time and tingles.  Her biggest complaint is Rt sided weakness and balance Pt accompanied by: self  SUBJECTIVE STATEMENT: Pt reports soreness today due to the weather  Next physician visits: PCP Dec 9th, [facility] Jan 19th   PERTINENT HISTORY: MS  PAIN:  Are you having pain? Yes: NPRS scale: 0/10 currently, up to 4/10 at worst Pain location: Rt LE and Rt UE Pain description: uncomfortable Aggravating factors: first thing in the morning, prolonged activity Relieving factors: rest  PRECAUTIONS: Fall and Other: MS  RED FLAGS: None   WEIGHT BEARING RESTRICTIONS: No  FALLS: Has patient fallen in last 6 months? Yes. Number of falls 3-4, none with injury  LIVING ENVIRONMENT: Lives with: lives alone Lives in: House/apartment Stairs: No Has following equipment at home: Walker - 4 wheeled  PLOF: Independent with  household mobility with device  PATIENT GOALS: improve balance and Rt sided strength  OBJECTIVE:  Note: Objective measures were completed at Evaluation unless otherwise noted.  DIAGNOSTIC FINDINGS: MRI 2024: Findings consistent with the clinical diagnosis of multiple sclerosis. Compared to prior MRI, there is interval progression of the T2 hyperintense lesions in the bilateral corona radiata and genu of the corpus callosum. No focus of abnormal contrast enhancement to suggest active demyelination.  COGNITION: Overall cognitive status: Within functional limits for tasks assessed   COORDINATION: WFL for RAMS UE and LE    LOWER EXTREMITY MMT:    MMT Right Eval Left Eval R/L 02/03/24    Hip flexion 3- 4 3+/4  Hip extension     Hip abduction     Hip adduction     Hip internal rotation     Hip external rotation     Knee flexion 3 4 4-/4+  Knee extension 3+ 4+ 4-/4+  Ankle dorsiflexion 3 4 3+ within available ROM / 4  Ankle plantarflexion     Ankle inversion     Ankle eversion     (Blank rows = not tested)   GAIT: Findings: Gait Characteristics: wide BOS, Distance walked: 100', Assistive device utilized:3 wheeled rollator, and Comments: slow cadence  FUNCTIONAL TESTS:  30 second chair rise = 3 with UE support TUG : 28.91 seconds with rollator BERG BALANCE TEST Sitting to Standing: 3.      Stands independently using hands Standing Unsupported: 3.      Stands 2 minutes with supervision Sitting Unsupported: 4.     Sits for 2 minutes independently Standing to Sitting: 2.     Uses back of legs against chair to control descent  Transfers: 3.     Transfers safely definite use of hands Standing with eyes closed: 3.     Stands 10 seconds with supervision Standing with feet together: 1.     Needs help to attain position but can hold for 15 seconds Reaching forward with outstretched arm: 1.     Reaches forward with supervision Retrieving object from the floor: 0.      Unable/needs assistance to keep from falling Turning to look behind: 1.     Needs supervision when turning Turning 360 degrees: 0.     Needs assistance Place alternate foot on stool: 1.     Completes >2 steps with minimal assist Standing with one foot in front: 0.     Loses balance while standing/stepping Standing on one foot: 0.     Unable  Total Score: 22/56    01/22/24: - TUG: 21.70sec w rollator    01/27/24: - BERG 35/56  02/03/24: - 30sec STS 6 reps w/ UE support  - TUG 23 sec initially (difficult ascent with rollator);  20.30sec w rollator                                                                                                                                 TREATMENT:   [facility] Adult PT Treatment:                                                DATE: 02/13/24  Therapeutic Exercise: 5 x STS For warm up Neuromuscular re-ed: Standing reaching overhead with towel roll for saggital plane stability - CGA Stagger stance 5# KB swing with CGA [place] ball pass in stagger stance - CGA TKE with green band x5 TKE green band + weight shift onto right side x10  Standing midline crossing (using opposite arm to reach for the ball) to practice weight shifting onto right side x10 CGA Weight shifting in standing side to side x8 Therapeutic Activity: Stepping over half foam roller x10 verbal cues to increase hip flexion activation and increase knee extension ROM over the foam roller; CGA and UE support from rolling walker LAQ in seated x8 each side    [facility] Adult PT Treatment:                                                DATE: 02/10/24  Neuromuscular re-ed: Partial tandem stance 2x30sec BIL no UE support, CGA  Standing RB TKE x10 Standing RB TKE w 5 sec hold x8 2# towel grip seated chest press on dynadisc x12  Therapeutic Activity: 2# short lever STS from raised mat 3x5 4 inch fwd step up x12 BIL w UE support Education/discussion re: safety w/ weight shifting on  step, appropriate home setup      [facility] Adult PT Treatment:                                                DATE: 02/03/24  Neuromuscular re-ed: 2# BIL shoulder raise w/ towel (increasing grip), seated; 3x6 cues for posture and control Static stance + UE reach fwd/side x12 BIL CGA for safety Static stance + vertical ball toss x15 cues for amplitude and posture, CGA for safety   Therapeutic Activity: MSK assessment + education 30sec STS + education TUG x2 + education Education/discussion re: progress with PT, symptom behavior as it affects activity tolerance, PT goals/POC      PATIENT EDUCATION: Education details:  Person educated: Patient Education method: Programmer, Multimedia, Demonstration, Actor cues, Verbal cues Education comprehension: verbalized understanding, returned demonstration, verbal cues required, tactile cues required, and needs further education  HOME EXERCISE PROGRAM: Access Code: [email or web address] URL: [email or web address] Date: 02/10/2024 Prepared by: Alm Jenny  Exercises - Sit to Stand with Armchair  - 1 x daily - 7 x weekly - 3 sets - 5 reps - Side Stepping with Counter Support  - 2-3 x daily - 7 x weekly - 1 sets - 10 reps - Standing Hip Extension with Counter Support  - 1 x daily - 7 x weekly - 3 sets - 10 reps - Tandem Stance with Support  - 1 x daily - 7 x weekly - 1 sets - 3 reps - 20-30 seconds hold - Forward Step Up with Counter Support  - 1 x daily - 7 x weekly - 2-3 sets - 6-8 reps - Standing March with Counter Support  - 2-3 x daily - 7 x weekly - 1 sets - 8 reps  GOALS: Goals reviewed with patient? Yes  SHORT TERM GOALS: Target date: 01/14/2024    Pt will be independent with initial HEP Baseline: 01/22/24: pt reports good HEP adherence Goal status: MET  2.  Pt will improve TUG to <= 24 seconds to demo improved functional mobility Baseline:  01/22/24: 21 sec w rollator   Goal status: MET   LONG TERM GOALS: Target date:  03/16/2024 (updated 02/03/24)    Pt will be independent with advanced HEP including plan for community exercise Baseline:  02/03/24: HEP going well at home per pt report Goal status: PROGRESSING  2.  Pt will improve Berg balance test to >=30/56 to demo improving balance Baseline:  Goal status: MET  3.  Pt will improve 30 second chair rise test to >= 9 reps with UE support Baseline:  02/03/24: 6 reps w/ UE support (MET INITIAL GOAL of 6 reps) Goal status: UPDATED/NEW 02/03/24  4.  Pt will improve Rt LE strength to 4/5 to improve functional mobility Baseline:  02/03/24: see MMT chart above Goal status: PROGRESSING  5. Pt will be able to perform TUG w/ LRAD in less than or equal to 16 sec in order to facilitate reduced fall risk (13.5sec cutoff score).  Baseline: 20.30sec w rollator  Goal status: INITIAL/NEW 02/03/24   ASSESSMENT:  CLINICAL IMPRESSION: 02/13/2024:   Continued to work on work on functional/postural strengthening, closed chain quad stability, no adverse reactions. Challenged with weight bearing on right side, apprehended right knee will buckle. Improvement noted after verbal cues to increase hip flexion activation during stepping over obstacle.    OBJECTIVE IMPAIRMENTS: Abnormal gait, decreased activity tolerance, difficulty walking, decreased strength, and impaired UE functional use.   ACTIVITY LIMITATIONS: standing, squatting, transfers, bathing, dressing, self feeding, and locomotion level  PARTICIPATION LIMITATIONS: meal prep, cleaning, driving, and community activity  PERSONAL FACTORS: 1-2 comorbidities: history of TIA, possible MS are also affecting patient's functional outcome.   REHAB POTENTIAL: Good  CLINICAL DECISION MAKING: Evolving/moderate complexity  EVALUATION COMPLEXITY: Moderate  PLAN: (updated 02/03/24)  PT FREQUENCY: 2x/week  PT DURATION: 6 weeks  PLANNED INTERVENTIONS: 97164- PT Re-evaluation, 97110-Therapeutic exercises, 97530-  Therapeutic activity, [id number]- Neuromuscular re-education, 97535- Self Care, 02859- Manual therapy, [id number]- Gait training, [phone]- Aquatic Therapy, Patient/Family education, Cryotherapy, and Moist heat  PLAN FOR NEXT SESSION: Gait and balance training, single limb, LAQ.   Lavanda Cleverly, SPT 02/13/24 12:00 PM

## 2024-02-18 ENCOUNTER — Ambulatory Visit: Admitting: Physical Therapy

## 2024-02-20 ENCOUNTER — Encounter: Payer: Self-pay | Admitting: Physical Therapy

## 2024-02-20 ENCOUNTER — Ambulatory Visit: Admitting: Physical Therapy

## 2024-02-20 DIAGNOSIS — M6281 Muscle weakness (generalized): Secondary | ICD-10-CM

## 2024-02-20 DIAGNOSIS — R262 Difficulty in walking, not elsewhere classified: Secondary | ICD-10-CM

## 2024-02-20 DIAGNOSIS — R2681 Unsteadiness on feet: Secondary | ICD-10-CM

## 2024-02-20 NOTE — Therapy (Signed)
 OUTPATIENT PHYSICAL THERAPY TREATMENT AND 10th VISIT note   Patient Name: Melinda Mckay MRN: [id number] DOB:03-11-1946, 78 y.o., female Today's Date: 02/20/2024  Dates of service: 12/17/23-02/20/24 PCP: Roanna Piedmont REFERRING PROVIDER: Roanna Piedmont Referring diagnosis: G81.91 (ICD-10-CM) - Hemiplegia, unspecified affecting right dominant side      END OF SESSION:  PT End of Session - 02/20/24 1147     Visit Number 13    Number of Visits 22    Date for Recertification  03/16/24    Authorization Type [employer] medicare    Authorization - Visit Number 10    Progress Note Due on Visit 20    PT Start Time 1100    PT Stop Time 1141    PT Time Calculation (min) 41 min    Activity Tolerance Patient tolerated treatment well    Behavior During Therapy WFL for tasks assessed/performed             Past Medical History:  Diagnosis Date   Anxiety    Asthma    Diabetes mellitus without complication (HCC)    Edema of lower extremity    Hyperlipidemia    Hypertension    Multiple sclerosis    Prediabetes    Restless legs    Past Surgical History:  Procedure Laterality Date   bone spur removal Right    BUNIONECTOMY Bilateral 2005   PARTIAL HYSTERECTOMY  2008   TUBAL LIGATION  1982   Patient Active Problem List   Diagnosis Date Noted   Cerebrovascular accident (CVA) due to occlusion of small artery (HCC) 10/30/2020   Numbness and tingling in right hand 10/30/2020   Acute stroke due to ischemia (HCC) 06/29/2020   Snoring 06/29/2020   Sleep apnea in adult 06/29/2020   Mild intermittent asthma 06/29/2020   Hypokalemia 04/24/2020   Lab test positive for detection of COVID-19 virus 04/24/2020   TIA (transient ischemic attack) 04/23/2020   Diverticulosis of colon 01/16/2018   Internal hemorrhoids 01/16/2018   Tubular adenoma 01/16/2018   Paresthesias 04/28/2017   Anxiety disorder 11/05/2016   Edema of lower extremity 11/05/2016   Essential hypertension 11/05/2016    Hyperlipidemia 11/05/2016   Prediabetes 11/05/2016   Restless legs 11/05/2016   Mild persistent asthma in adult without complication 12/12/2011    ONSET DATE: 2024  REFERRING DIAG: Hemiplegia  THERAPY DIAG:  Muscle weakness (generalized)  Difficulty in walking, not elsewhere classified  Unsteadiness on feet  Rationale for Evaluation and Treatment: Rehabilitation  SUBJECTIVE:   Per eval: Pt states she was diagnosed with MS 1 year ago. She does not see neurologist again until 03/2024. Pt worked with physical therapy at this clinic until 11/2022 and was discharged with HEP. She also incorporated water aerobics and was doing that until April of this year. She states that since April she has been extremely tired and fatigued and is only able to complete 1 out of the house task a day due to fatigue. She saw PCP a few weeks ago who recommended she resume PT.  She uses a rollator at all times for ambulation. She states that Rt LE is painful up to the hip and feels more weak than Lt. She also has pain in Rt UE and her Rt hand is cold all the time and tingles.  Her biggest complaint is Rt sided weakness and balance Pt accompanied by: self  SUBJECTIVE STATEMENT: Pt reports soreness today due to the weather  Next physician visits: PCP Dec 9th, [facility] Jan 19th   PERTINENT HISTORY: MS  PAIN:  Are you having pain? Yes: NPRS scale: 0/10 currently, up to 4/10 at worst Pain location: Rt LE and Rt UE Pain description: uncomfortable Aggravating factors: first thing in the morning, prolonged activity Relieving factors: rest  PRECAUTIONS: Fall and Other: MS  RED FLAGS: None   WEIGHT BEARING RESTRICTIONS: No  FALLS: Has patient fallen in last 6 months? Yes. Number of falls 3-4, none with injury  LIVING ENVIRONMENT: Lives  with: lives alone Lives in: House/apartment Stairs: No Has following equipment at home: Walker - 4 wheeled  PLOF: Independent with household mobility with device  PATIENT GOALS: improve balance and Rt sided strength  OBJECTIVE:  Note: Objective measures were completed at Evaluation unless otherwise noted.  DIAGNOSTIC FINDINGS: MRI 2024: Findings consistent with the clinical diagnosis of multiple sclerosis. Compared to prior MRI, there is interval progression of the T2 hyperintense lesions in the bilateral corona radiata and genu of the corpus callosum. No focus of abnormal contrast enhancement to suggest active demyelination.  COGNITION: Overall cognitive status: Within functional limits for tasks assessed   COORDINATION: WFL for RAMS UE and LE    LOWER EXTREMITY MMT:    MMT Right Eval Left Eval R/L 02/03/24    Hip flexion 3- 4 3+/4  Hip extension     Hip abduction     Hip adduction     Hip internal rotation     Hip external rotation     Knee flexion 3 4 4-/4+  Knee extension 3+ 4+ 4-/4+  Ankle dorsiflexion 3 4 3+ within available ROM / 4  Ankle plantarflexion     Ankle inversion     Ankle eversion     (Blank rows = not tested)   GAIT: Findings: Gait Characteristics: wide BOS, Distance walked: 100', Assistive device utilized:3 wheeled rollator, and Comments: slow cadence  FUNCTIONAL TESTS:  30 second chair rise = 3 with UE support TUG : 28.91 seconds with rollator BERG BALANCE TEST Sitting to Standing: 3.      Stands independently using hands Standing Unsupported: 3.      Stands 2 minutes with supervision Sitting Unsupported: 4.     Sits for 2 minutes independently Standing to Sitting: 2.     Uses back of legs against chair to control descent  Transfers: 3.     Transfers safely definite use of hands Standing with eyes closed: 3.     Stands 10 seconds with supervision Standing with feet together: 1.     Needs help to attain position but can hold for 15  seconds Reaching forward with outstretched arm: 1.     Reaches forward with supervision Retrieving object from the floor: 0.     Unable/needs assistance to keep from falling Turning to look behind: 1.     Needs supervision when turning Turning 360 degrees: 0.     Needs assistance Place alternate foot on stool: 1.     Completes >2 steps with minimal assist Standing with one foot in front: 0.     Loses balance while standing/stepping Standing on one foot: 0.     Unable  Total Score: 22/56    01/22/24: - TUG: 21.70sec w rollator    01/27/24: - BERG 35/56  02/03/24: - 30sec STS 6 reps w/ UE support  - TUG 23 sec initially (difficult ascent with rollator);  20.30sec w rollator                                                                                                                                 TREATMENT:   [facility] Adult PT Treatment:                                                DATE: 02/20/24 Therapeutic Exercise: 5 x STS for warm up Sit <> stand with red TB around thighs 2 x 5  Neuromuscular re-ed: Stagger stance head turns vertical, horizontal Standing with Lt LE on 4'' step for Rt stance control: bilat shoulder flexion with vertical head turns Lt LE tap ups with min facilitation for Rt stance control Alternating tap ups 4'' step for motor coordination and timing - cueing to keep rhythm Stepping laterally and fwd/bkwd over obstacles with min A HHA, facilitation for wt shifts Seated LAQ with hip add ball squeeze 2 x 10 bilat   [facility] Adult PT Treatment:                                                DATE: 02/13/24  Therapeutic Exercise: 5 x STS For warm up Neuromuscular re-ed: Standing reaching overhead with towel roll for saggital plane stability - CGA Stagger stance 5# KB swing with CGA [place] ball pass in stagger stance - CGA TKE with green band x5 TKE green band + weight shift onto right side x10  Standing midline crossing (using opposite arm to reach  for the ball) to practice weight shifting onto right side x10 CGA Weight shifting in standing side to side x8 Therapeutic Activity: Stepping over half foam roller x10 verbal cues to increase hip flexion activation and increase knee extension ROM over the foam roller; CGA and UE support from rolling walker LAQ in seated x8 each side    [facility] Adult PT Treatment:                                                DATE: 02/10/24  Neuromuscular re-ed: Partial tandem stance 2x30sec BIL no UE support, CGA  Standing RB TKE x10 Standing RB TKE w 5 sec hold x8 2# towel grip seated chest press on dynadisc x12  Therapeutic Activity: 2# short lever STS from raised mat 3x5 4 inch fwd step up x12 BIL w UE support Education/discussion re: safety w/ weight shifting on step, appropriate home setup      [facility] Adult PT Treatment:  DATE: 02/03/24  Neuromuscular re-ed: 2# BIL shoulder raise w/ towel (increasing grip), seated; 3x6 cues for posture and control Static stance + UE reach fwd/side x12 BIL CGA for safety Static stance + vertical ball toss x15 cues for amplitude and posture, CGA for safety   Therapeutic Activity: MSK assessment + education 30sec STS + education TUG x2 + education Education/discussion re: progress with PT, symptom behavior as it affects activity tolerance, PT goals/POC      PATIENT EDUCATION: Education details:  Person educated: Patient Education method: Programmer, Multimedia, Demonstration, Actor cues, Verbal cues Education comprehension: verbalized understanding, returned demonstration, verbal cues required, tactile cues required, and needs further education     HOME EXERCISE PROGRAM: Access Code: [email or web address] URL: [email or web address] Date: 02/10/2024 Prepared by: Alm Jenny  Exercises - Sit to Stand with Armchair  - 1 x daily - 7 x weekly - 3 sets - 5 reps - Side Stepping with Counter Support  - 2-3 x daily - 7  x weekly - 1 sets - 10 reps - Standing Hip Extension with Counter Support  - 1 x daily - 7 x weekly - 3 sets - 10 reps - Tandem Stance with Support  - 1 x daily - 7 x weekly - 1 sets - 3 reps - 20-30 seconds hold - Forward Step Up with Counter Support  - 1 x daily - 7 x weekly - 2-3 sets - 6-8 reps - Standing March with Counter Support  - 2-3 x daily - 7 x weekly - 1 sets - 8 reps  GOALS: Goals reviewed with patient? Yes  SHORT TERM GOALS: Target date: 01/14/2024    Pt will be independent with initial HEP Baseline: 01/22/24: pt reports good HEP adherence Goal status: MET  2.  Pt will improve TUG to <= 24 seconds to demo improved functional mobility Baseline:  01/22/24: 21 sec w rollator   Goal status: MET   LONG TERM GOALS: Target date: 03/16/2024 (updated 02/03/24)    Pt will be independent with advanced HEP including plan for community exercise Baseline:  02/03/24: HEP going well at home per pt report Goal status: PROGRESSING  2.  Pt will improve Berg balance test to >=30/56 to demo improving balance Baseline:  Goal status: MET  3.  Pt will improve 30 second chair rise test to >= 9 reps with UE support Baseline:  02/03/24: 6 reps w/ UE support (MET INITIAL GOAL of 6 reps) Goal status: UPDATED/NEW 02/03/24  4.  Pt will improve Rt LE strength to 4/5 to improve functional mobility Baseline:  02/03/24: see MMT chart above Goal status: PROGRESSING  5. Pt will be able to perform TUG w/ LRAD in less than or equal to 16 sec in order to facilitate reduced fall risk (13.5sec cutoff score).  Baseline: 20.30sec w rollator  Goal status: INITIAL/NEW 02/03/24   ASSESSMENT:  CLINICAL IMPRESSION: 02/20/2024:   Treatment continued to focus on Rt LE strength and stability with dynamic stepping tasks. Pt Rt LE fatigues quickly but she is improving balance reactions and is performing static balance with head turns with only CGA   OBJECTIVE IMPAIRMENTS: Abnormal gait, decreased  activity tolerance, difficulty walking, decreased strength, and impaired UE functional use.    PLAN: (updated 02/03/24)  PT FREQUENCY: 2x/week  PT DURATION: 6 weeks  PLANNED INTERVENTIONS: [id number]- PT Re-evaluation, 97110-Therapeutic exercises, 97530- Therapeutic activity, [id number]- Neuromuscular re-education, 97535- Self Care, [id number]- Manual therapy, [id number]- Gait training, [phone]- Aquatic Therapy, Patient/Family education, Cryotherapy, and Moist heat  PLAN FOR  NEXT SESSION: Gait and balance training, single limb, LAQ.   Darice Conine, PT,DPT12/12/202511:48 AM

## 2024-02-24 ENCOUNTER — Ambulatory Visit: Admitting: Physical Therapy

## 2024-02-24 ENCOUNTER — Encounter: Payer: Self-pay | Admitting: Physical Therapy

## 2024-02-24 DIAGNOSIS — R2681 Unsteadiness on feet: Secondary | ICD-10-CM

## 2024-02-24 DIAGNOSIS — M6281 Muscle weakness (generalized): Secondary | ICD-10-CM | POA: Diagnosis not present

## 2024-02-24 DIAGNOSIS — R262 Difficulty in walking, not elsewhere classified: Secondary | ICD-10-CM

## 2024-02-24 NOTE — Therapy (Signed)
 OUTPATIENT PHYSICAL THERAPY TREATMENT    Patient Name: Melinda Mckay MRN: [id number] DOB:[birth year], 78 y.o., female Today's Date: 02/24/2024  PCP: Roanna Piedmont REFERRING PROVIDER: Roanna Piedmont Referring diagnosis: G81.91 (ICD-10-CM) - Hemiplegia, unspecified affecting right dominant side      END OF SESSION:  PT End of Session - 02/24/24 1143     Visit Number 14    Number of Visits 22    Date for Recertification  03/16/24    Authorization Type [employer] medicare    Authorization Time Period kx modifier visit 15    Progress Note Due on Visit 20    PT Start Time 1144    PT Stop Time 1226    PT Time Calculation (min) 42 min              Past Medical History:  Diagnosis Date   Anxiety    Asthma    Diabetes mellitus without complication (HCC)    Edema of lower extremity    Hyperlipidemia    Hypertension    Multiple sclerosis    Prediabetes    Restless legs    Past Surgical History:  Procedure Laterality Date   bone spur removal Right    BUNIONECTOMY Bilateral 2005   PARTIAL HYSTERECTOMY  2008   TUBAL LIGATION  1982   Patient Active Problem List   Diagnosis Date Noted   Cerebrovascular accident (CVA) due to occlusion of small artery (HCC) 10/30/2020   Numbness and tingling in right hand 10/30/2020   Acute stroke due to ischemia (HCC) 06/29/2020   Snoring 06/29/2020   Sleep apnea in adult 06/29/2020   Mild intermittent asthma 06/29/2020   Hypokalemia 04/24/2020   Lab test positive for detection of COVID-19 virus 04/24/2020   TIA (transient ischemic attack) 04/23/2020   Diverticulosis of colon 01/16/2018   Internal hemorrhoids 01/16/2018   Tubular adenoma 01/16/2018   Paresthesias 04/28/2017   Anxiety disorder 11/05/2016   Edema of lower extremity 11/05/2016   Essential hypertension 11/05/2016   Hyperlipidemia 11/05/2016   Prediabetes 11/05/2016   Restless legs 11/05/2016   Mild persistent asthma in adult without complication 12/12/2011     ONSET DATE: 2024  REFERRING DIAG: Hemiplegia  THERAPY DIAG:  Muscle weakness (generalized)  Difficulty in walking, not elsewhere classified  Unsteadiness on feet  Rationale for Evaluation and Treatment: Rehabilitation  SUBJECTIVE:   Per eval: Pt states she was diagnosed with MS 1 year ago. She does not see neurologist again until 03/2024. Pt worked with physical therapy at this clinic until 11/2022 and was discharged with HEP. She also incorporated water aerobics and was doing that until April of this year. She states that since April she has been extremely tired and fatigued and is only able to complete 1 out of the house task a day due to fatigue. She saw PCP a few weeks ago who recommended she resume PT.  She uses a rollator at all times for ambulation. She states that Rt LE is painful up to the hip and feels more weak than Lt. She also has pain in Rt UE and her Rt hand is cold all the time and tingles.  Her biggest complaint is Rt sided weakness and balance Pt accompanied by: self  SUBJECTIVE STATEMENT: 02/24/2024: didn't do HEP yesterday but was active out in the yard. No issues after last session. No other new updates.     Next physician visits: PCP Dec 9th, [facility] Jan 19th   PERTINENT HISTORY: MS  PAIN:  Are you having pain? Yes: NPRS scale: 0/10 currently, up to 4/10 at worst Pain location: Rt LE and Rt UE Pain description: uncomfortable Aggravating factors: first thing in the morning, prolonged activity Relieving factors: rest  PRECAUTIONS: Fall and Other: MS  RED FLAGS: None   WEIGHT BEARING RESTRICTIONS: No  FALLS: Has patient fallen in last 6 months? Yes. Number of falls 3-4, none with injury  LIVING ENVIRONMENT: Lives with: lives alone Lives in: House/apartment Stairs: No Has following  equipment at home: Walker - 4 wheeled  PLOF: Independent with household mobility with device  PATIENT GOALS: improve balance and Rt sided strength  OBJECTIVE:  Note: Objective measures were completed at Evaluation unless otherwise noted.  DIAGNOSTIC FINDINGS: MRI 2024: Findings consistent with the clinical diagnosis of multiple sclerosis. Compared to prior MRI, there is interval progression of the T2 hyperintense lesions in the bilateral corona radiata and genu of the corpus callosum. No focus of abnormal contrast enhancement to suggest active demyelination.  COGNITION: Overall cognitive status: Within functional limits for tasks assessed   COORDINATION: WFL for RAMS UE and LE    LOWER EXTREMITY MMT:    MMT Right Eval Left Eval R/L 02/03/24    Hip flexion 3- 4 3+/4  Hip extension     Hip abduction     Hip adduction     Hip internal rotation     Hip external rotation     Knee flexion 3 4 4-/4+  Knee extension 3+ 4+ 4-/4+  Ankle dorsiflexion 3 4 3+ within available ROM / 4  Ankle plantarflexion     Ankle inversion     Ankle eversion     (Blank rows = not tested)   GAIT: Findings: Gait Characteristics: wide BOS, Distance walked: 100', Assistive device utilized:3 wheeled rollator, and Comments: slow cadence  FUNCTIONAL TESTS:  30 second chair rise = 3 with UE support TUG : 28.91 seconds with rollator BERG BALANCE TEST Sitting to Standing: 3.      Stands independently using hands Standing Unsupported: 3.      Stands 2 minutes with supervision Sitting Unsupported: 4.     Sits for 2 minutes independently Standing to Sitting: 2.     Uses back of legs against chair to control descent  Transfers: 3.     Transfers safely definite use of hands Standing with eyes closed: 3.     Stands 10 seconds with supervision Standing with feet together: 1.     Needs help to attain position but can hold for 15 seconds Reaching forward with outstretched arm: 1.     Reaches forward with  supervision Retrieving object from the floor: 0.     Unable/needs assistance to keep from falling Turning to look behind: 1.     Needs supervision when turning Turning 360 degrees: 0.     Needs assistance Place alternate foot on stool: 1.     Completes >2 steps with minimal assist Standing with one foot in front: 0.     Loses balance while standing/stepping Standing on one foot: 0.     Unable  Total Score: 22/56    01/22/24: - TUG: 21.70sec w rollator    01/27/24: - BERG 35/56  02/03/24: - 30sec  STS 6 reps w/ UE support  - TUG 23 sec initially (difficult ascent with rollator); 20.30sec w rollator                                                                                                                                 TREATMENT:   [facility] Adult PT Treatment:                                                DATE: 02/24/24  Neuromuscular re-ed: Gait w rollator 3x2ft, PT utilizing SPC for RLE to provide external cue for increased clearance(third bout removing external cue) Airex shuffle 2x39ft SPC cues for closed chain stability and posture  4 inch step up + RB TKE 2x6 BIL UE support  Therapeutic Activity: STS 2x10 from raised mat (~24 inches) no UE support  4 inch RLE x12 BIL UE support     [facility] Adult PT Treatment:                                                DATE: 02/20/24 Therapeutic Exercise: 5 x STS for warm up Sit <> stand with red TB around thighs 2 x 5  Neuromuscular re-ed: Stagger stance head turns vertical, horizontal Standing with Lt LE on 4'' step for Rt stance control: bilat shoulder flexion with vertical head turns Lt LE tap ups with min facilitation for Rt stance control Alternating tap ups 4'' step for motor coordination and timing - cueing to keep rhythm Stepping laterally and fwd/bkwd over obstacles with min A HHA, facilitation for wt shifts Seated LAQ with hip add ball squeeze 2 x 10 bilat   [facility] Adult PT Treatment:                                                 DATE: 02/13/24  Therapeutic Exercise: 5 x STS For warm up Neuromuscular re-ed: Standing reaching overhead with towel roll for saggital plane stability - CGA Stagger stance 5# KB swing with CGA [place] ball pass in stagger stance - CGA TKE with green band x5 TKE green band + weight shift onto right side x10  Standing midline crossing (using opposite arm to reach for the ball) to practice weight shifting onto right side x10 CGA Weight shifting in standing side to side x8 Therapeutic Activity: Stepping over half foam roller x10 verbal cues to increase hip flexion activation and increase knee extension ROM over the foam roller; CGA and UE support from rolling walker LAQ in seated x8 each  side    [facility] Adult PT Treatment:                                                DATE: 02/10/24  Neuromuscular re-ed: Partial tandem stance 2x30sec BIL no UE support, CGA  Standing RB TKE x10 Standing RB TKE w 5 sec hold x8 2# towel grip seated chest press on dynadisc x12  Therapeutic Activity: 2# short lever STS from raised mat 3x5 4 inch fwd step up x12 BIL w UE support Education/discussion re: safety w/ weight shifting on step, appropriate home setup      [facility] Adult PT Treatment:                                                DATE: 02/03/24  Neuromuscular re-ed: 2# BIL shoulder raise w/ towel (increasing grip), seated; 3x6 cues for posture and control Static stance + UE reach fwd/side x12 BIL CGA for safety Static stance + vertical ball toss x15 cues for amplitude and posture, CGA for safety   Therapeutic Activity: MSK assessment + education 30sec STS + education TUG x2 + education Education/discussion re: progress with PT, symptom behavior as it affects activity tolerance, PT goals/POC      PATIENT EDUCATION: Education details:  Person educated: Patient Education method: Programmer, Multimedia, Demonstration, Actor cues, Verbal cues Education comprehension:  verbalized understanding, returned demonstration, verbal cues required, tactile cues required, and needs further education     HOME EXERCISE PROGRAM: Access Code: [email or web address] URL: [email or web address] Date: 02/10/2024 Prepared by: Alm Jenny  Exercises - Sit to Stand with Armchair  - 1 x daily - 7 x weekly - 3 sets - 5 reps - Side Stepping with Counter Support  - 2-3 x daily - 7 x weekly - 1 sets - 10 reps - Standing Hip Extension with Counter Support  - 1 x daily - 7 x weekly - 3 sets - 10 reps - Tandem Stance with Support  - 1 x daily - 7 x weekly - 1 sets - 3 reps - 20-30 seconds hold - Forward Step Up with Counter Support  - 1 x daily - 7 x weekly - 2-3 sets - 6-8 reps - Standing March with Counter Support  - 2-3 x daily - 7 x weekly - 1 sets - 8 reps  GOALS: Goals reviewed with patient? Yes  SHORT TERM GOALS: Target date: 01/14/2024    Pt will be independent with initial HEP Baseline: 01/22/24: pt reports good HEP adherence Goal status: MET  2.  Pt will improve TUG to <= 24 seconds to demo improved functional mobility Baseline:  01/22/24: 21 sec w rollator   Goal status: MET   LONG TERM GOALS: Target date: 03/16/2024 (updated 02/03/24)    Pt will be independent with advanced HEP including plan for community exercise Baseline:  02/03/24: HEP going well at home per pt report Goal status: PROGRESSING  2.  Pt will improve Berg balance test to >=30/56 to demo improving balance Baseline:  Goal status: MET  3.  Pt will improve 30 second chair rise test to >= 9 reps with UE support Baseline:  02/03/24: 6 reps w/ UE support (MET INITIAL GOAL of 6 reps) Goal status: UPDATED/NEW  02/03/24  4.  Pt will improve Rt LE strength to 4/5 to improve functional mobility Baseline:  02/03/24: see MMT chart above Goal status: PROGRESSING  5. Pt will be able to perform TUG w/ LRAD in less than or equal to 16 sec in order to facilitate reduced fall risk (13.5sec cutoff  score).  Baseline: 20.30sec w rollator  Goal status: INITIAL/NEW 02/03/24   ASSESSMENT:  CLINICAL IMPRESSION: 02/24/2024: Pt arrives w/o pain, no issues after last session. Today continuing to work on functional strengthening with reduced reliance on UE support, postural stability w/ emphasis on hip flexor activation and closed chain quad stability. CGA for postural stability training w/ gait belt. No adverse events, tolerates session well. Recommend continuing along current POC in order to address relevant deficits and improve functional tolerance. Pt departs today's session in no acute distress, all voiced questions/concerns addressed appropriately from PT perspective.     OBJECTIVE IMPAIRMENTS: Abnormal gait, decreased activity tolerance, difficulty walking, decreased strength, and impaired UE functional use.    PLAN: (updated 02/03/24)  PT FREQUENCY: 2x/week  PT DURATION: 6 weeks  PLANNED INTERVENTIONS: [id number]- PT Re-evaluation, 97110-Therapeutic exercises, 97530- Therapeutic activity, [id number]- Neuromuscular re-education, 97535- Self Care, [id number]- Manual therapy, [id number]- Gait training, [phone]- Aquatic Therapy, Patient/Family education, Cryotherapy, and Moist heat  PLAN FOR NEXT SESSION: Gait and balance training, single limb, LAQ.   Alm DELENA Jenny PT, DPT 02/24/2024 12:40 PM

## 2024-02-25 NOTE — Therapy (Signed)
 OUTPATIENT PHYSICAL THERAPY TREATMENT    Patient Name: Melinda Mckay MRN: [id number] DOB:Sep 29, 1945, 78 y.o., female Today's Date: 02/26/2024  PCP: Roanna Piedmont REFERRING PROVIDER: Roanna Piedmont Referring diagnosis: G81.91 (ICD-10-CM) - Hemiplegia, unspecified affecting right dominant side      END OF SESSION:  PT End of Session - 02/26/24 1447     Visit Number 15    Number of Visits 22    Date for Recertification  03/16/24    Authorization Type [employer] medicare    Authorization Time Period kx modifier visit 15    Progress Note Due on Visit 20    PT Start Time 1448    PT Stop Time 1527    PT Time Calculation (min) 39 min               Past Medical History:  Diagnosis Date   Anxiety    Asthma    Diabetes mellitus without complication (HCC)    Edema of lower extremity    Hyperlipidemia    Hypertension    Multiple sclerosis    Prediabetes    Restless legs    Past Surgical History:  Procedure Laterality Date   bone spur removal Right    BUNIONECTOMY Bilateral 2005   PARTIAL HYSTERECTOMY  2008   TUBAL LIGATION  1982   Patient Active Problem List   Diagnosis Date Noted   Cerebrovascular accident (CVA) due to occlusion of small artery (HCC) 10/30/2020   Numbness and tingling in right hand 10/30/2020   Acute stroke due to ischemia (HCC) 06/29/2020   Snoring 06/29/2020   Sleep apnea in adult 06/29/2020   Mild intermittent asthma 06/29/2020   Hypokalemia 04/24/2020   Lab test positive for detection of COVID-19 virus 04/24/2020   TIA (transient ischemic attack) 04/23/2020   Diverticulosis of colon 01/16/2018   Internal hemorrhoids 01/16/2018   Tubular adenoma 01/16/2018   Paresthesias 04/28/2017   Anxiety disorder 11/05/2016   Edema of lower extremity 11/05/2016   Essential hypertension 11/05/2016   Hyperlipidemia 11/05/2016   Prediabetes 11/05/2016   Restless legs 11/05/2016   Mild persistent asthma in adult without complication 12/12/2011     ONSET DATE: 2024  REFERRING DIAG: Hemiplegia  THERAPY DIAG:  Muscle weakness (generalized)  Difficulty in walking, not elsewhere classified  Rationale for Evaluation and Treatment: Rehabilitation  SUBJECTIVE:   Per eval: Pt states she was diagnosed with MS 1 year ago. She does not see neurologist again until 03/2024. Pt worked with physical therapy at this clinic until 11/2022 and was discharged with HEP. She also incorporated water aerobics and was doing that until April of this year. She states that since April she has been extremely tired and fatigued and is only able to complete 1 out of the house task a day due to fatigue. She saw PCP a few weeks ago who recommended she resume PT.  She uses a rollator at all times for ambulation. She states that Rt LE is painful up to the hip and feels more weak than Lt. She also has pain in Rt UE and her Rt hand is cold all the time and tingles.  Her biggest complaint is Rt sided weakness and balance Pt accompanied by: self  SUBJECTIVE STATEMENT: 02/26/2024: knee a bit stiff which she attributes to weather. Cleaned out her freezer yesterday and did well. Felt okay after last session. No other new updates.    Next physician visits: PCP Dec 9th, [facility] Jan 19th   PERTINENT HISTORY: MS  PAIN:  Are you having pain? Yes: NPRS scale: 0/10 currently, up to 4/10 at worst Pain location: Rt LE and Rt UE Pain description: uncomfortable Aggravating factors: first thing in the morning, prolonged activity Relieving factors: rest  PRECAUTIONS: Fall and Other: MS  RED FLAGS: None   WEIGHT BEARING RESTRICTIONS: No  FALLS: Has patient fallen in last 6 months? Yes. Number of falls 3-4, none with injury  LIVING ENVIRONMENT: Lives with: lives alone Lives in: House/apartment Stairs:  No Has following equipment at home: Walker - 4 wheeled  PLOF: Independent with household mobility with device  PATIENT GOALS: improve balance and Rt sided strength  OBJECTIVE:  Note: Objective measures were completed at Evaluation unless otherwise noted.  DIAGNOSTIC FINDINGS: MRI 2024: Findings consistent with the clinical diagnosis of multiple sclerosis. Compared to prior MRI, there is interval progression of the T2 hyperintense lesions in the bilateral corona radiata and genu of the corpus callosum. No focus of abnormal contrast enhancement to suggest active demyelination.  COGNITION: Overall cognitive status: Within functional limits for tasks assessed   COORDINATION: WFL for RAMS UE and LE    LOWER EXTREMITY MMT:    MMT Right Eval Left Eval R/L 02/03/24    Hip flexion 3- 4 3+/4  Hip extension     Hip abduction     Hip adduction     Hip internal rotation     Hip external rotation     Knee flexion 3 4 4-/4+  Knee extension 3+ 4+ 4-/4+  Ankle dorsiflexion 3 4 3+ within available ROM / 4  Ankle plantarflexion     Ankle inversion     Ankle eversion     (Blank rows = not tested)   GAIT: Findings: Gait Characteristics: wide BOS, Distance walked: 100', Assistive device utilized:3 wheeled rollator, and Comments: slow cadence  FUNCTIONAL TESTS:  30 second chair rise = 3 with UE support TUG : 28.91 seconds with rollator BERG BALANCE TEST Sitting to Standing: 3.      Stands independently using hands Standing Unsupported: 3.      Stands 2 minutes with supervision Sitting Unsupported: 4.     Sits for 2 minutes independently Standing to Sitting: 2.     Uses back of legs against chair to control descent  Transfers: 3.     Transfers safely definite use of hands Standing with eyes closed: 3.     Stands 10 seconds with supervision Standing with feet together: 1.     Needs help to attain position but can hold for 15 seconds Reaching forward with outstretched arm: 1.      Reaches forward with supervision Retrieving object from the floor: 0.     Unable/needs assistance to keep from falling Turning to look behind: 1.     Needs supervision when turning Turning 360 degrees: 0.     Needs assistance Place alternate foot on stool: 1.     Completes >2 steps with minimal assist Standing with one foot in front: 0.     Loses balance while standing/stepping Standing on one foot: 0.     Unable  Total Score: 22/56    01/22/24: - TUG: 21.70sec w rollator    01/27/24: - BERG  35/56  02/03/24: - 30sec STS 6 reps w/ UE support  - TUG 23 sec initially (difficult ascent with rollator); 20.30sec w rollator                                                                                                                                 TREATMENT:   [facility] Adult PT Treatment:                                                DATE: 02/26/24  Neuromuscular re-ed: Ball kick + SPC ambulation + ball pick up x4 BIL CGA-minA cues for sequencing, stance limb stability, posture 6 inch step tap alternating x8 BIL UE support cues for alignment, reduced frontal plane compensations   Therapeutic Activity: STS lowest mat x12 (UE support) cues for pacing  6 inch fwd step up BIL UE support x10 each LE      [facility] Adult PT Treatment:                                                DATE: 02/24/24  Neuromuscular re-ed: Gait w rollator 3x83ft, PT utilizing SPC for RLE to provide external cue for increased clearance(third bout removing external cue) Airex shuffle 2x49ft SPC cues for closed chain stability and posture  4 inch step up + RB TKE 2x6 BIL UE support  Therapeutic Activity: STS 2x10 from raised mat (~24 inches) no UE support  4 inch RLE x12 BIL UE support     [facility] Adult PT Treatment:                                                DATE: 02/20/24 Therapeutic Exercise: 5 x STS for warm up Sit <> stand with red TB around thighs 2 x 5  Neuromuscular re-ed: Stagger stance  head turns vertical, horizontal Standing with Lt LE on 4'' step for Rt stance control: bilat shoulder flexion with vertical head turns Lt LE tap ups with min facilitation for Rt stance control Alternating tap ups 4'' step for motor coordination and timing - cueing to keep rhythm Stepping laterally and fwd/bkwd over obstacles with min A HHA, facilitation for wt shifts Seated LAQ with hip add ball squeeze 2 x 10 bilat   [facility] Adult PT Treatment:  DATE: 02/13/24  Therapeutic Exercise: 5 x STS For warm up Neuromuscular re-ed: Standing reaching overhead with towel roll for saggital plane stability - CGA Stagger stance 5# KB swing with CGA [place] ball pass in stagger stance - CGA TKE with green band x5 TKE green band + weight shift onto right side x10  Standing midline crossing (using opposite arm to reach for the ball) to practice weight shifting onto right side x10 CGA Weight shifting in standing side to side x8 Therapeutic Activity: Stepping over half foam roller x10 verbal cues to increase hip flexion activation and increase knee extension ROM over the foam roller; CGA and UE support from rolling walker LAQ in seated x8 each side    [facility] Adult PT Treatment:                                                DATE: 02/10/24  Neuromuscular re-ed: Partial tandem stance 2x30sec BIL no UE support, CGA  Standing RB TKE x10 Standing RB TKE w 5 sec hold x8 2# towel grip seated chest press on dynadisc x12  Therapeutic Activity: 2# short lever STS from raised mat 3x5 4 inch fwd step up x12 BIL w UE support Education/discussion re: safety w/ weight shifting on step, appropriate home setup     PATIENT EDUCATION: Education details:  Person educated: Patient Education method: Programmer, Multimedia, Facilities Manager, Actor cues, Verbal cues Education comprehension: verbalized understanding, returned demonstration, verbal cues required, tactile cues  required, and needs further education     HOME EXERCISE PROGRAM: Access Code: [email or web address] URL: [email or web address] Date: 02/26/2024 Prepared by: Alm Jenny  Exercises - Sit to Stand with Armchair  - 1 x daily - 7 x weekly - 3 sets - 5 reps - Side Stepping with Counter Support  - 2-3 x daily - 7 x weekly - 1 sets - 10 reps - Standing Hip Extension with Counter Support  - 1 x daily - 7 x weekly - 3 sets - 10 reps - Tandem Stance with Support  - 1 x daily - 7 x weekly - 1 sets - 3 reps - 20-30 seconds hold - Forward Step Up with Counter Support  - 1 x daily - 7 x weekly - 2-3 sets - 6-8 reps - Standing Forward Step Taps with Counter Support  - 1 x daily - 7 x weekly - 2 sets - 8 reps  GOALS: Goals reviewed with patient? Yes  SHORT TERM GOALS: Target date: 01/14/2024    Pt will be independent with initial HEP Baseline: 01/22/24: pt reports good HEP adherence Goal status: MET  2.  Pt will improve TUG to <= 24 seconds to demo improved functional mobility Baseline:  01/22/24: 21 sec w rollator   Goal status: MET   LONG TERM GOALS: Target date: 03/16/2024 (updated 02/03/24)    Pt will be independent with advanced HEP including plan for community exercise Baseline:  02/03/24: HEP going well at home per pt report Goal status: PROGRESSING  2.  Pt will improve Berg balance test to >=30/56 to demo improving balance Baseline:  Goal status: MET  3.  Pt will improve 30 second chair rise test to >= 9 reps with UE support Baseline:  02/03/24: 6 reps w/ UE support (MET INITIAL GOAL of 6 reps) Goal status: UPDATED/NEW 02/03/24  4.  Pt will improve Rt LE  strength to 4/5 to improve functional mobility Baseline:  02/03/24: see MMT chart above Goal status: PROGRESSING  5. Pt will be able to perform TUG w/ LRAD in less than or equal to 16 sec in order to facilitate reduced fall risk (13.5sec cutoff score).  Baseline: 20.30sec w rollator  Goal status: INITIAL/NEW  02/03/24   ASSESSMENT:  CLINICAL IMPRESSION: 02/26/2024: Pt arrives w/ stiffness in knee but no pain, no issues after last session. Today we continue working on postural stability, much of session spent w/ ball kick, SPC ambulation, and ball pick up (alternating LE) to work on multi-component postural stability. Mild instability requiring CGA-minA but otherwise performs well with inc time to complete. Also able to progress step height for step ups but does demonstrate compensations. No adverse events, tolerates session well without pain. Recommend continuing along current POC in order to address relevant deficits and improve functional tolerance. Pt departs today's session in no acute distress, all voiced questions/concerns addressed appropriately from PT perspective.     OBJECTIVE IMPAIRMENTS: Abnormal gait, decreased activity tolerance, difficulty walking, decreased strength, and impaired UE functional use.    PLAN: (updated 02/03/24)  PT FREQUENCY: 2x/week  PT DURATION: 6 weeks  PLANNED INTERVENTIONS: [id number]- PT Re-evaluation, 97110-Therapeutic exercises, 97530- Therapeutic activity, [id number]- Neuromuscular re-education, 97535- Self Care, [id number]- Manual therapy, [id number]- Gait training, [phone]- Aquatic Therapy, Patient/Family education, Cryotherapy, and Moist heat  PLAN FOR NEXT SESSION: Gait and balance training, single limb, LAQ.   Alm DELENA Jenny PT, DPT 02/26/2024 5:18 PM

## 2024-02-26 ENCOUNTER — Encounter: Payer: Self-pay | Admitting: Physical Therapy

## 2024-02-26 ENCOUNTER — Ambulatory Visit: Admitting: Physical Therapy

## 2024-02-26 DIAGNOSIS — M6281 Muscle weakness (generalized): Secondary | ICD-10-CM | POA: Diagnosis not present

## 2024-02-26 DIAGNOSIS — R262 Difficulty in walking, not elsewhere classified: Secondary | ICD-10-CM

## 2024-03-01 NOTE — Therapy (Addendum)
 " OUTPATIENT PHYSICAL THERAPY TREATMENT AND DISCHARGE   Patient Name: Melinda Mckay MRN: [id number] DOB:27-Feb-1946, 78 y.o., female Today's Date: 03/02/2024  PCP: Roanna Piedmont REFERRING PROVIDER: Roanna Piedmont Referring diagnosis: G81.91 (ICD-10-CM) - Hemiplegia, unspecified affecting right dominant side      END OF SESSION:  PT End of Session - 03/02/24 1457     Visit Number 16    Number of Visits 22    Date for Recertification  03/16/24    Authorization Type [employer] medicare    Authorization Time Period kx modifier visit 15    Progress Note Due on Visit 20    PT Start Time 1456   late check in   PT Stop Time 1528    PT Time Calculation (min) 32 min                Past Medical History:  Diagnosis Date   Anxiety    Asthma    Diabetes mellitus without complication (HCC)    Edema of lower extremity    Hyperlipidemia    Hypertension    Multiple sclerosis    Prediabetes    Restless legs    Past Surgical History:  Procedure Laterality Date   bone spur removal Right    BUNIONECTOMY Bilateral 2005   PARTIAL HYSTERECTOMY  2008   TUBAL LIGATION  1982   Patient Active Problem List   Diagnosis Date Noted   Cerebrovascular accident (CVA) due to occlusion of small artery (HCC) 10/30/2020   Numbness and tingling in right hand 10/30/2020   Acute stroke due to ischemia (HCC) 06/29/2020   Snoring 06/29/2020   Sleep apnea in adult 06/29/2020   Mild intermittent asthma 06/29/2020   Hypokalemia 04/24/2020   Lab test positive for detection of COVID-19 virus 04/24/2020   TIA (transient ischemic attack) 04/23/2020   Diverticulosis of colon 01/16/2018   Internal hemorrhoids 01/16/2018   Tubular adenoma 01/16/2018   Paresthesias 04/28/2017   Anxiety disorder 11/05/2016   Edema of lower extremity 11/05/2016   Essential hypertension 11/05/2016   Hyperlipidemia 11/05/2016   Prediabetes 11/05/2016   Restless legs 11/05/2016   Mild persistent asthma in adult  without complication 12/12/2011    ONSET DATE: 2024  REFERRING DIAG: Hemiplegia  THERAPY DIAG:  Muscle weakness (generalized)  Difficulty in walking, not elsewhere classified  Rationale for Evaluation and Treatment: Rehabilitation  SUBJECTIVE:   Per eval: Pt states she was diagnosed with MS 1 year ago. She does not see neurologist again until 03/2024. Pt worked with physical therapy at this clinic until 11/2022 and was discharged with HEP. She also incorporated water aerobics and was doing that until April of this year. She states that since April she has been extremely tired and fatigued and is only able to complete 1 out of the house task a day due to fatigue. She saw PCP a few weeks ago who recommended she resume PT.  She uses a rollator at all times for ambulation. She states that Rt LE is painful up to the hip and feels more weak than Lt. She also has pain in Rt UE and her Rt hand is cold all the time and tingles.  Her biggest complaint is Rt sided weakness and balance Pt accompanied by: self  SUBJECTIVE STATEMENT: 03/02/2024: saw Dr. Roanna earlier today. Running a bit late to today's visit d/t appt and traffic. Did well after last session, has been more active over the weekend (doing stairs).    Next physician visits: [facility] Jan 19th   PERTINENT HISTORY: MS  PAIN:  Are you having pain? Yes: NPRS scale: 0/10 currently, up to 4/10 at worst Pain location: Rt LE and Rt UE Pain description: uncomfortable Aggravating factors: first thing in the morning, prolonged activity Relieving factors: rest  PRECAUTIONS: Fall and Other: MS  RED FLAGS: None   WEIGHT BEARING RESTRICTIONS: No  FALLS: Has patient fallen in last 6 months? Yes. Number of falls 3-4, none with injury  LIVING ENVIRONMENT: Lives with: lives  alone Lives in: House/apartment Stairs: No Has following equipment at home: Walker - 4 wheeled  PLOF: Independent with household mobility with device  PATIENT GOALS: improve balance and Rt sided strength  OBJECTIVE:  Note: Objective measures were completed at Evaluation unless otherwise noted.  DIAGNOSTIC FINDINGS: MRI 2024: Findings consistent with the clinical diagnosis of multiple sclerosis. Compared to prior MRI, there is interval progression of the T2 hyperintense lesions in the bilateral corona radiata and genu of the corpus callosum. No focus of abnormal contrast enhancement to suggest active demyelination.  COGNITION: Overall cognitive status: Within functional limits for tasks assessed   COORDINATION: WFL for RAMS UE and LE    LOWER EXTREMITY MMT:    MMT Right Eval Left Eval R/L 02/03/24    Hip flexion 3- 4 3+/4  Hip extension     Hip abduction     Hip adduction     Hip internal rotation     Hip external rotation     Knee flexion 3 4 4-/4+  Knee extension 3+ 4+ 4-/4+  Ankle dorsiflexion 3 4 3+ within available ROM / 4  Ankle plantarflexion     Ankle inversion     Ankle eversion     (Blank rows = not tested)   GAIT: Findings: Gait Characteristics: wide BOS, Distance walked: 100', Assistive device utilized:3 wheeled rollator, and Comments: slow cadence  FUNCTIONAL TESTS:  30 second chair rise = 3 with UE support TUG : 28.91 seconds with rollator BERG BALANCE TEST Sitting to Standing: 3.      Stands independently using hands Standing Unsupported: 3.      Stands 2 minutes with supervision Sitting Unsupported: 4.     Sits for 2 minutes independently Standing to Sitting: 2.     Uses back of legs against chair to control descent  Transfers: 3.     Transfers safely definite use of hands Standing with eyes closed: 3.     Stands 10 seconds with supervision Standing with feet together: 1.     Needs help to attain position but can hold for 15  seconds Reaching forward with outstretched arm: 1.     Reaches forward with supervision Retrieving object from the floor: 0.     Unable/needs assistance to keep from falling Turning to look behind: 1.     Needs supervision when turning Turning 360 degrees: 0.     Needs assistance Place alternate foot on stool: 1.     Completes >2 steps with minimal assist Standing with one foot in front: 0.     Loses balance while standing/stepping Standing on one foot: 0.     Unable  Total Score: 22/56    01/22/24: - TUG: 21.70sec w rollator    01/27/24: -  BERG 35/56  02/03/24: - 30sec STS 6 reps w/ UE support  - TUG 23 sec initially (difficult ascent with rollator); 20.30sec w rollator                                                                                                                                 TREATMENT:   [facility] Adult PT Treatment:                                                DATE: 03/02/24  Neuromuscular re-ed: Cone weaving + ball dribble (LE), SPC CGA 2 laps (4 cones)  Fwd/retro/turning reactive balance SPC , CGA Ambulation 17ft SPC w/ 2# AW RLE, 80 ft without AQ    [facility] Adult PT Treatment:                                                DATE: 02/26/24  Neuromuscular re-ed: Ball kick + SPC ambulation + ball pick up x4 BIL CGA-minA cues for sequencing, stance limb stability, posture 6 inch step tap alternating x8 BIL UE support cues for alignment, reduced frontal plane compensations   Therapeutic Activity: STS lowest mat x12 (UE support) cues for pacing  6 inch fwd step up BIL UE support x10 each LE      [facility] Adult PT Treatment:                                                DATE: 02/24/24  Neuromuscular re-ed: Gait w rollator 3x98ft, PT utilizing SPC for RLE to provide external cue for increased clearance(third bout removing external cue) Airex shuffle 2x77ft SPC cues for closed chain stability and posture  4 inch step up + RB TKE 2x6 BIL UE  support  Therapeutic Activity: STS 2x10 from raised mat (~24 inches) no UE support  4 inch RLE x12 BIL UE support     [facility] Adult PT Treatment:                                                DATE: 02/20/24 Therapeutic Exercise: 5 x STS for warm up Sit <> stand with red TB around thighs 2 x 5  Neuromuscular re-ed: Stagger stance head turns vertical, horizontal Standing with Lt LE on 4'' step for Rt stance control: bilat shoulder flexion with vertical head turns Lt LE tap ups with min facilitation for Rt stance  control Alternating tap ups 4'' step for motor coordination and timing - cueing to keep rhythm Stepping laterally and fwd/bkwd over obstacles with min A HHA, facilitation for wt shifts Seated LAQ with hip add ball squeeze 2 x 10 bilat   [facility] Adult PT Treatment:                                                DATE: 02/13/24  Therapeutic Exercise: 5 x STS For warm up Neuromuscular re-ed: Standing reaching overhead with towel roll for saggital plane stability - CGA Stagger stance 5# KB swing with CGA [place] ball pass in stagger stance - CGA TKE with green band x5 TKE green band + weight shift onto right side x10  Standing midline crossing (using opposite arm to reach for the ball) to practice weight shifting onto right side x10 CGA Weight shifting in standing side to side x8 Therapeutic Activity: Stepping over half foam roller x10 verbal cues to increase hip flexion activation and increase knee extension ROM over the foam roller; CGA and UE support from rolling walker LAQ in seated x8 each side    [facility] Adult PT Treatment:                                                DATE: 02/10/24  Neuromuscular re-ed: Partial tandem stance 2x30sec BIL no UE support, CGA  Standing RB TKE x10 Standing RB TKE w 5 sec hold x8 2# towel grip seated chest press on dynadisc x12  Therapeutic Activity: 2# short lever STS from raised mat 3x5 4 inch fwd step up x12 BIL w UE  support Education/discussion re: safety w/ weight shifting on step, appropriate home setup     PATIENT EDUCATION: Education details:  Person educated: Patient Education method: Programmer, Multimedia, Facilities Manager, Actor cues, Verbal cues Education comprehension: verbalized understanding, returned demonstration, verbal cues required, tactile cues required, and needs further education     HOME EXERCISE PROGRAM: Access Code: [email or web address] URL: [email or web address] Date: 02/26/2024 Prepared by: Alm Jenny  Exercises - Sit to Stand with Armchair  - 1 x daily - 7 x weekly - 3 sets - 5 reps - Side Stepping with Counter Support  - 2-3 x daily - 7 x weekly - 1 sets - 10 reps - Standing Hip Extension with Counter Support  - 1 x daily - 7 x weekly - 3 sets - 10 reps - Tandem Stance with Support  - 1 x daily - 7 x weekly - 1 sets - 3 reps - 20-30 seconds hold - Forward Step Up with Counter Support  - 1 x daily - 7 x weekly - 2-3 sets - 6-8 reps - Standing Forward Step Taps with Counter Support  - 1 x daily - 7 x weekly - 2 sets - 8 reps    ASSESSMENT:  CLINICAL IMPRESSION: 03/01/2024: Pt arrives with no new complaints since last session. Today's visit shortened w/ late check in, but able to progress multiplanar reactive and anticipatory postural stability training using SPC. Does well with this, balance is challenged but no overt instability or LOB. Continues to demo reduced clearance on RLE, this improves after ambulation using ankle weight on limb. No adverse events, tolerates session  well overall. Recommend continuing along current POC in order to address relevant deficits and improve functional tolerance. Pt departs today's session in no acute distress, all voiced questions/concerns addressed appropriately from PT perspective.    OBJECTIVE IMPAIRMENTS: Abnormal gait, decreased activity tolerance, difficulty walking, decreased strength, and impaired UE functional use.      GOALS: Goals reviewed with patient? Yes  SHORT TERM GOALS: Target date: 01/14/2024    Pt will be independent with initial HEP Baseline: 01/22/24: pt reports good HEP adherence Goal status: MET  2.  Pt will improve TUG to <= 24 seconds to demo improved functional mobility Baseline:  01/22/24: 21 sec w rollator   Goal status: MET   LONG TERM GOALS: Target date: 03/16/2024 (updated 02/03/24)    Pt will be independent with advanced HEP including plan for community exercise Baseline:  02/03/24: HEP going well at home per pt report Goal status: PROGRESSING  2.  Pt will improve Berg balance test to >=30/56 to demo improving balance Baseline:  Goal status: MET  3.  Pt will improve 30 second chair rise test to >= 9 reps with UE support Baseline:  02/03/24: 6 reps w/ UE support (MET INITIAL GOAL of 6 reps) Goal status: UPDATED/NEW 02/03/24  4.  Pt will improve Rt LE strength to 4/5 to improve functional mobility Baseline:  02/03/24: see MMT chart above Goal status: PROGRESSING  5. Pt will be able to perform TUG w/ LRAD in less than or equal to 16 sec in order to facilitate reduced fall risk (13.5sec cutoff score).  Baseline: 20.30sec w rollator  Goal status: INITIAL/NEW 02/03/24    PLAN: (updated 02/03/24)  PT FREQUENCY: 2x/week  PT DURATION: 6 weeks  PLANNED INTERVENTIONS: [id number]- PT Re-evaluation, 97110-Therapeutic exercises, 97530- Therapeutic activity, 97112- Neuromuscular re-education, 97535- Self Care, [id number]- Manual therapy, [id number]- Gait training, [phone]- Aquatic Therapy, Patient/Family education, Cryotherapy, and Moist heat  PLAN FOR NEXT SESSION: Gait and balance training, single limb, LAQ.   Alm DELENA Jenny PT, DPT 03/02/2024 5:24 PM   PHYSICAL THERAPY DISCHARGE SUMMARY  Visits from Start of Care: 16  Current functional level related to goals / functional outcomes: Improving strength and balance   Remaining deficits: See above   Education /  Equipment: HEP   Patient agrees to discharge. Patient goals were partially met. Patient is being discharged due to not returning since the last visit.  Darice Conine, PT,DPT01/27/2610:34 AM   "

## 2024-03-02 ENCOUNTER — Ambulatory Visit: Admitting: Physical Therapy

## 2024-03-02 ENCOUNTER — Encounter: Payer: Self-pay | Admitting: Physical Therapy

## 2024-03-02 DIAGNOSIS — R262 Difficulty in walking, not elsewhere classified: Secondary | ICD-10-CM

## 2024-03-02 DIAGNOSIS — M6281 Muscle weakness (generalized): Secondary | ICD-10-CM

## 2024-03-16 ENCOUNTER — Ambulatory Visit: Admitting: Physical Therapy

## 2024-03-16 NOTE — Therapy (Incomplete)
 " OUTPATIENT PHYSICAL THERAPY PROGRESS NOTE + RECERTIFICATION ***    Patient Name: Melinda Mckay MRN: [id number] DOB:Aug 12, 1945, 79 y.o., female Today's Date: 03/16/2024  PCP: Roanna Piedmont REFERRING PROVIDER: Roanna Piedmont Referring diagnosis: G81.91 (ICD-10-CM) - Hemiplegia, unspecified affecting right dominant side    Progress Note Reporting Period 02/03/24 to 03/16/24  See note below for Objective Data and Assessment of Progress/Goals.      END OF SESSION:          Past Medical History:  Diagnosis Date   Anxiety    Asthma    Diabetes mellitus without complication (HCC)    Edema of lower extremity    Hyperlipidemia    Hypertension    Multiple sclerosis    Prediabetes    Restless legs    Past Surgical History:  Procedure Laterality Date   bone spur removal Right    BUNIONECTOMY Bilateral 2005   PARTIAL HYSTERECTOMY  2008   TUBAL LIGATION  1982   Patient Active Problem List   Diagnosis Date Noted   Cerebrovascular accident (CVA) due to occlusion of small artery (HCC) 10/30/2020   Numbness and tingling in right hand 10/30/2020   Acute stroke due to ischemia (HCC) 06/29/2020   Snoring 06/29/2020   Sleep apnea in adult 06/29/2020   Mild intermittent asthma 06/29/2020   Hypokalemia 04/24/2020   Lab test positive for detection of COVID-19 virus 04/24/2020   TIA (transient ischemic attack) 04/23/2020   Diverticulosis of colon 01/16/2018   Internal hemorrhoids 01/16/2018   Tubular adenoma 01/16/2018   Paresthesias 04/28/2017   Anxiety disorder 11/05/2016   Edema of lower extremity 11/05/2016   Essential hypertension 11/05/2016   Hyperlipidemia 11/05/2016   Prediabetes 11/05/2016   Restless legs 11/05/2016   Mild persistent asthma in adult without complication 12/12/2011    ONSET DATE: 2024  REFERRING DIAG: Hemiplegia  THERAPY DIAG:  No diagnosis found.  Rationale for Evaluation and Treatment: Rehabilitation  SUBJECTIVE:   Per eval: Pt  states she was diagnosed with MS 1 year ago. She does not see neurologist again until 03/2024. Pt worked with physical therapy at this clinic until 11/2022 and was discharged with HEP. She also incorporated water aerobics and was doing that until April of this year. She states that since April she has been extremely tired and fatigued and is only able to complete 1 out of the house task a day due to fatigue. She saw PCP a few weeks ago who recommended she resume PT.  She uses a rollator at all times for ambulation. She states that Rt LE is painful up to the hip and feels more weak than Lt. She also has pain in Rt UE and her Rt hand is cold all the time and tingles.  Her biggest complaint is Rt sided weakness and balance Pt accompanied by: self  SUBJECTIVE STATEMENT: 03/16/2024: ***  *** saw Dr. Roanna earlier today. Running a bit late to today's visit d/t appt and traffic. Did well after last session, has been more active over the weekend (doing stairs).    Next physician visits: [facility] Jan 19th   PERTINENT HISTORY: MS  PAIN:  Are you having pain? Yes: NPRS scale: 0/10 currently, up to 4/10 at worst Pain location: Rt LE and Rt UE Pain description: uncomfortable Aggravating factors: first thing in the morning, prolonged activity Relieving factors: rest  PRECAUTIONS: Fall and Other: MS  RED FLAGS: None   WEIGHT BEARING RESTRICTIONS: No  FALLS: Has patient fallen in last 6 months? Yes. Number of falls 3-4, none with injury  LIVING ENVIRONMENT: Lives with: lives alone Lives in: House/apartment Stairs: No Has following equipment at home: Walker - 4 wheeled  PLOF: Independent with household mobility with device  PATIENT GOALS: improve balance and Rt sided strength  OBJECTIVE:  Note: Objective measures were completed at  Evaluation unless otherwise noted.  DIAGNOSTIC FINDINGS: MRI 2024: Findings consistent with the clinical diagnosis of multiple sclerosis. Compared to prior MRI, there is interval progression of the T2 hyperintense lesions in the bilateral corona radiata and genu of the corpus callosum. No focus of abnormal contrast enhancement to suggest active demyelination.  COGNITION: Overall cognitive status: Within functional limits for tasks assessed   COORDINATION: WFL for RAMS UE and LE    LOWER EXTREMITY MMT:    MMT Right Eval Left Eval R/L 02/03/24    Hip flexion 3- 4 3+/4  Hip extension     Hip abduction     Hip adduction     Hip internal rotation     Hip external rotation     Knee flexion 3 4 4-/4+  Knee extension 3+ 4+ 4-/4+  Ankle dorsiflexion 3 4 3+ within available ROM / 4  Ankle plantarflexion     Ankle inversion     Ankle eversion     (Blank rows = not tested)   GAIT: Findings: Gait Characteristics: wide BOS, Distance walked: 100', Assistive device utilized:3 wheeled rollator, and Comments: slow cadence  FUNCTIONAL TESTS:  30 second chair rise = 3 with UE support TUG : 28.91 seconds with rollator BERG BALANCE TEST Sitting to Standing: 3.      Stands independently using hands Standing Unsupported: 3.      Stands 2 minutes with supervision Sitting Unsupported: 4.     Sits for 2 minutes independently Standing to Sitting: 2.     Uses back of legs against chair to control descent  Transfers: 3.     Transfers safely definite use of hands Standing with eyes closed: 3.     Stands 10 seconds with supervision Standing with feet together: 1.     Needs help to attain position but can hold for 15 seconds Reaching forward with outstretched arm: 1.     Reaches forward with supervision Retrieving object from the floor: 0.     Unable/needs assistance to keep from falling Turning to look behind: 1.     Needs supervision when turning Turning 360 degrees: 0.     Needs  assistance Place alternate foot on stool: 1.     Completes >2 steps with minimal assist Standing with one foot in front: 0.     Loses balance while standing/stepping Standing on one foot: 0.     Unable  Total Score: 22/56    01/22/24: - TUG: 21.70sec w rollator  01/27/24: - BERG 35/56  02/03/24: - 30sec STS 6 reps w/ UE support  - TUG 23 sec initially (difficult ascent with rollator); 20.30sec w rollator    03/16/24: - 30 sec STS *** - TUG ***                                                                                                                                 TREATMENT:   [facility] Adult PT Treatment:                                                DATE: 03/16/24 Therapeutic Exercise: *** Manual Therapy: *** Neuromuscular re-ed: *** Therapeutic Activity: TUG + education  30sec STS + education MSK assessment + education Education/discussion re: progress with PT, symptom behavior as it affects activity tolerance, PT goals/POC  Modalities: *** Self Care: ***     [facility] Adult PT Treatment:                                                DATE: 03/02/24  Neuromuscular re-ed: Cone weaving + ball dribble (LE), SPC CGA 2 laps (4 cones)  Fwd/retro/turning reactive balance SPC , CGA Ambulation 75ft SPC w/ 2# AW RLE, 80 ft without AQ    [facility] Adult PT Treatment:                                                DATE: 02/26/24  Neuromuscular re-ed: Ball kick + SPC ambulation + ball pick up x4 BIL CGA-minA cues for sequencing, stance limb stability, posture 6 inch step tap alternating x8 BIL UE support cues for alignment, reduced frontal plane compensations   Therapeutic Activity: STS lowest mat x12 (UE support) cues for pacing  6 inch fwd step up BIL UE support x10 each LE      [facility] Adult PT Treatment:                                                DATE: 02/24/24  Neuromuscular re-ed: Gait w rollator 3x64ft, PT utilizing SPC for RLE to provide external cue  for increased clearance(third bout removing external cue) Airex shuffle 2x13ft SPC cues for closed chain stability and posture  4 inch step up + RB TKE 2x6 BIL UE support  Therapeutic Activity: STS 2x10 from raised mat (~24 inches) no UE support  4 inch RLE x12 BIL UE  support     [facility] Adult PT Treatment:                                                DATE: 02/20/24 Therapeutic Exercise: 5 x STS for warm up Sit <> stand with red TB around thighs 2 x 5  Neuromuscular re-ed: Stagger stance head turns vertical, horizontal Standing with Lt LE on 4'' step for Rt stance control: bilat shoulder flexion with vertical head turns Lt LE tap ups with min facilitation for Rt stance control Alternating tap ups 4'' step for motor coordination and timing - cueing to keep rhythm Stepping laterally and fwd/bkwd over obstacles with min A HHA, facilitation for wt shifts Seated LAQ with hip add ball squeeze 2 x 10 bilat   [facility] Adult PT Treatment:                                                DATE: 02/13/24  Therapeutic Exercise: 5 x STS For warm up Neuromuscular re-ed: Standing reaching overhead with towel roll for saggital plane stability - CGA Stagger stance 5# KB swing with CGA [place] ball pass in stagger stance - CGA TKE with green band x5 TKE green band + weight shift onto right side x10  Standing midline crossing (using opposite arm to reach for the ball) to practice weight shifting onto right side x10 CGA Weight shifting in standing side to side x8 Therapeutic Activity: Stepping over half foam roller x10 verbal cues to increase hip flexion activation and increase knee extension ROM over the foam roller; CGA and UE support from rolling walker LAQ in seated x8 each side      PATIENT EDUCATION: Education details:  Person educated: Patient Education method: Programmer, Multimedia, Facilities Manager, Actor cues, Verbal cues Education comprehension: verbalized understanding, returned  demonstration, verbal cues required, tactile cues required, and needs further education     HOME EXERCISE PROGRAM: Access Code: [email or web address] URL: [email or web address] Date: 02/26/2024 Prepared by: Alm Jenny  Exercises - Sit to Stand with Armchair  - 1 x daily - 7 x weekly - 3 sets - 5 reps - Side Stepping with Counter Support  - 2-3 x daily - 7 x weekly - 1 sets - 10 reps - Standing Hip Extension with Counter Support  - 1 x daily - 7 x weekly - 3 sets - 10 reps - Tandem Stance with Support  - 1 x daily - 7 x weekly - 1 sets - 3 reps - 20-30 seconds hold - Forward Step Up with Counter Support  - 1 x daily - 7 x weekly - 2-3 sets - 6-8 reps - Standing Forward Step Taps with Counter Support  - 1 x daily - 7 x weekly - 2 sets - 8 reps    ASSESSMENT:  CLINICAL IMPRESSION: 03/16/2024: ***  *** Pt arrives with no new complaints since last session. Today's visit shortened w/ late check in, but able to progress multiplanar reactive and anticipatory postural stability training using SPC. Does well with this, balance is challenged but no overt instability or LOB. Continues to demo reduced clearance on RLE, this improves after ambulation using ankle weight on limb. No adverse events, tolerates session well overall. Recommend continuing  along current POC in order to address relevant deficits and improve functional tolerance. Pt departs today's session in no acute distress, all voiced questions/concerns addressed appropriately from PT perspective.    OBJECTIVE IMPAIRMENTS: Abnormal gait, decreased activity tolerance, difficulty walking, decreased strength, and impaired UE functional use.     GOALS: Goals reviewed with patient? Yes  SHORT TERM GOALS: Target date: 01/14/2024    Pt will be independent with initial HEP Baseline: 01/22/24: pt reports good HEP adherence Goal status: MET  2.  Pt will improve TUG to <= 24 seconds to demo improved functional mobility Baseline:   01/22/24: 21 sec w rollator   Goal status: MET   LONG TERM GOALS: Target date: 03/16/2024 (updated 02/03/24)***(updated 03/16/24)    Pt will be independent with advanced HEP including plan for community exercise Baseline:  02/03/24: HEP going well at home per pt report 03/16/24: *** Goal status: ***  2.  Pt will improve Berg balance test to >=30/56 to demo improving balance Baseline:  Goal status: MET  3.  Pt will improve 30 second chair rise test to >= 9 reps with UE support Baseline:  02/03/24: 6 reps w/ UE support (MET INITIAL GOAL of 6 reps) 03/16/24: *** Goal status: ***  4.  Pt will improve Rt LE strength to 4/5 to improve functional mobility Baseline:  02/03/24: see MMT chart above 03/16/24: *** Goal status: ***  5. Pt will be able to perform TUG w/ LRAD in less than or equal to 16 sec in order to facilitate reduced fall risk (13.5sec cutoff score).  Baseline: 20.30sec w rollator  03/16/24: ***  Goal status: ***    PLAN: (updated 02/03/24)  PT FREQUENCY: 2x/week  PT DURATION: 6 weeks  PLANNED INTERVENTIONS: [id number]- PT Re-evaluation, 97110-Therapeutic exercises, 97530- Therapeutic activity, 97112- Neuromuscular re-education, 97535- Self Care, [id number]- Manual therapy, [id number]- Gait training, [phone]- Aquatic Therapy, Patient/Family education, Cryotherapy, and Moist heat  PLAN FOR NEXT SESSION: Gait and balance training, single limb, LAQ.   Alm DELENA Jenny PT, DPT 03/16/2024 9:53 AM    "

## 2024-03-18 ENCOUNTER — Ambulatory Visit: Attending: Internal Medicine | Admitting: Physical Therapy

## 2024-03-18 DIAGNOSIS — R2681 Unsteadiness on feet: Secondary | ICD-10-CM | POA: Insufficient documentation

## 2024-03-18 DIAGNOSIS — R262 Difficulty in walking, not elsewhere classified: Secondary | ICD-10-CM | POA: Insufficient documentation

## 2024-03-18 DIAGNOSIS — M6281 Muscle weakness (generalized): Secondary | ICD-10-CM | POA: Insufficient documentation

## 2024-03-18 NOTE — Therapy (Incomplete)
 " OUTPATIENT PHYSICAL THERAPY PROGRESS NOTE + RECERTIFICATION ***    Patient Name: Melinda Mckay MRN: [id number] DOB:1945/07/07, 79 y.o., female Today's Date: 03/18/2024  PCP: Roanna Piedmont REFERRING PROVIDER: Roanna Piedmont Referring diagnosis: G81.91 (ICD-10-CM) - Hemiplegia, unspecified affecting right dominant side    Progress Note Reporting Period 02/03/24 to 03/18/24  See note below for Objective Data and Assessment of Progress/Goals.      END OF SESSION:          Past Medical History:  Diagnosis Date   Anxiety    Asthma    Diabetes mellitus without complication (HCC)    Edema of lower extremity    Hyperlipidemia    Hypertension    Multiple sclerosis    Prediabetes    Restless legs    Past Surgical History:  Procedure Laterality Date   bone spur removal Right    BUNIONECTOMY Bilateral 2005   PARTIAL HYSTERECTOMY  2008   TUBAL LIGATION  1982   Patient Active Problem List   Diagnosis Date Noted   Cerebrovascular accident (CVA) due to occlusion of small artery (HCC) 10/30/2020   Numbness and tingling in right hand 10/30/2020   Acute stroke due to ischemia (HCC) 06/29/2020   Snoring 06/29/2020   Sleep apnea in adult 06/29/2020   Mild intermittent asthma 06/29/2020   Hypokalemia 04/24/2020   Lab test positive for detection of COVID-19 virus 04/24/2020   TIA (transient ischemic attack) 04/23/2020   Diverticulosis of colon 01/16/2018   Internal hemorrhoids 01/16/2018   Tubular adenoma 01/16/2018   Paresthesias 04/28/2017   Anxiety disorder 11/05/2016   Edema of lower extremity 11/05/2016   Essential hypertension 11/05/2016   Hyperlipidemia 11/05/2016   Prediabetes 11/05/2016   Restless legs 11/05/2016   Mild persistent asthma in adult without complication 12/12/2011    ONSET DATE: 2024  REFERRING DIAG: Hemiplegia  THERAPY DIAG:  No diagnosis found.  Rationale for Evaluation and Treatment: Rehabilitation  SUBJECTIVE:   Per eval: Pt  states she was diagnosed with MS 1 year ago. She does not see neurologist again until 03/2024. Pt worked with physical therapy at this clinic until 11/2022 and was discharged with HEP. She also incorporated water aerobics and was doing that until April of this year. She states that since April she has been extremely tired and fatigued and is only able to complete 1 out of the house task a day due to fatigue. She saw PCP a few weeks ago who recommended she resume PT.  She uses a rollator at all times for ambulation. She states that Rt LE is painful up to the hip and feels more weak than Lt. She also has pain in Rt UE and her Rt hand is cold all the time and tingles.  Her biggest complaint is Rt sided weakness and balance Pt accompanied by: self  SUBJECTIVE STATEMENT: 03/18/2024: ***  *** saw Dr. Roanna earlier today. Running a bit late to today's visit d/t appt and traffic. Did well after last session, has been more active over the weekend (doing stairs).    Next physician visits: [facility] Jan 19th   PERTINENT HISTORY: MS  PAIN:  Are you having pain? Yes: NPRS scale: 0/10 currently, up to 4/10 at worst Pain location: Rt LE and Rt UE Pain description: uncomfortable Aggravating factors: first thing in the morning, prolonged activity Relieving factors: rest  PRECAUTIONS: Fall and Other: MS  RED FLAGS: None   WEIGHT BEARING RESTRICTIONS: No  FALLS: Has patient fallen in last 6 months? Yes. Number of falls 3-4, none with injury  LIVING ENVIRONMENT: Lives with: lives alone Lives in: House/apartment Stairs: No Has following equipment at home: Walker - 4 wheeled  PLOF: Independent with household mobility with device  PATIENT GOALS: improve balance and Rt sided strength  OBJECTIVE:  Note: Objective measures were completed at  Evaluation unless otherwise noted.  DIAGNOSTIC FINDINGS: MRI 2024: Findings consistent with the clinical diagnosis of multiple sclerosis. Compared to prior MRI, there is interval progression of the T2 hyperintense lesions in the bilateral corona radiata and genu of the corpus callosum. No focus of abnormal contrast enhancement to suggest active demyelination.  COGNITION: Overall cognitive status: Within functional limits for tasks assessed   COORDINATION: WFL for RAMS UE and LE    LOWER EXTREMITY MMT:    MMT Right Eval Left Eval R/L 02/03/24    Hip flexion 3- 4 3+/4  Hip extension     Hip abduction     Hip adduction     Hip internal rotation     Hip external rotation     Knee flexion 3 4 4-/4+  Knee extension 3+ 4+ 4-/4+  Ankle dorsiflexion 3 4 3+ within available ROM / 4  Ankle plantarflexion     Ankle inversion     Ankle eversion     (Blank rows = not tested)   GAIT: Findings: Gait Characteristics: wide BOS, Distance walked: 100', Assistive device utilized:3 wheeled rollator, and Comments: slow cadence  FUNCTIONAL TESTS:  30 second chair rise = 3 with UE support TUG : 28.91 seconds with rollator BERG BALANCE TEST Sitting to Standing: 3.      Stands independently using hands Standing Unsupported: 3.      Stands 2 minutes with supervision Sitting Unsupported: 4.     Sits for 2 minutes independently Standing to Sitting: 2.     Uses back of legs against chair to control descent  Transfers: 3.     Transfers safely definite use of hands Standing with eyes closed: 3.     Stands 10 seconds with supervision Standing with feet together: 1.     Needs help to attain position but can hold for 15 seconds Reaching forward with outstretched arm: 1.     Reaches forward with supervision Retrieving object from the floor: 0.     Unable/needs assistance to keep from falling Turning to look behind: 1.     Needs supervision when turning Turning 360 degrees: 0.     Needs  assistance Place alternate foot on stool: 1.     Completes >2 steps with minimal assist Standing with one foot in front: 0.     Loses balance while standing/stepping Standing on one foot: 0.     Unable  Total Score: 22/56    01/22/24: - TUG: 21.70sec w rollator  01/27/24: - BERG 35/56  02/03/24: - 30sec STS 6 reps w/ UE support  - TUG 23 sec initially (difficult ascent with rollator); 20.30sec w rollator    03/18/24: - 30 sec STS *** - TUG ***                                                                                                                                 TREATMENT:   [facility] Adult PT Treatment:                                                DATE: 03/18/24 Therapeutic Exercise: *** Manual Therapy: *** Neuromuscular re-ed: *** Therapeutic Activity: TUG + education  30sec STS + education MSK assessment + education Education/discussion re: progress with PT, symptom behavior as it affects activity tolerance, PT goals/POC  Modalities: *** Self Care: ***     [facility] Adult PT Treatment:                                                DATE: 03/02/24  Neuromuscular re-ed: Cone weaving + ball dribble (LE), SPC CGA 2 laps (4 cones)  Fwd/retro/turning reactive balance SPC , CGA Ambulation 45ft SPC w/ 2# AW RLE, 80 ft without AQ    [facility] Adult PT Treatment:                                                DATE: 02/26/24  Neuromuscular re-ed: Ball kick + SPC ambulation + ball pick up x4 BIL CGA-minA cues for sequencing, stance limb stability, posture 6 inch step tap alternating x8 BIL UE support cues for alignment, reduced frontal plane compensations   Therapeutic Activity: STS lowest mat x12 (UE support) cues for pacing  6 inch fwd step up BIL UE support x10 each LE      [facility] Adult PT Treatment:                                                DATE: 02/24/24  Neuromuscular re-ed: Gait w rollator 3x2ft, PT utilizing SPC for RLE to provide external cue  for increased clearance(third bout removing external cue) Airex shuffle 2x15ft SPC cues for closed chain stability and posture  4 inch step up + RB TKE 2x6 BIL UE support  Therapeutic Activity: STS 2x10 from raised mat (~24 inches) no UE support  4 inch RLE x12 BIL UE  support     [facility] Adult PT Treatment:                                                DATE: 02/20/24 Therapeutic Exercise: 5 x STS for warm up Sit <> stand with red TB around thighs 2 x 5  Neuromuscular re-ed: Stagger stance head turns vertical, horizontal Standing with Lt LE on 4'' step for Rt stance control: bilat shoulder flexion with vertical head turns Lt LE tap ups with min facilitation for Rt stance control Alternating tap ups 4'' step for motor coordination and timing - cueing to keep rhythm Stepping laterally and fwd/bkwd over obstacles with min A HHA, facilitation for wt shifts Seated LAQ with hip add ball squeeze 2 x 10 bilat    PATIENT EDUCATION: Education details:  Person educated: Patient Education method: Programmer, Multimedia, Facilities Manager, Actor cues, Verbal cues Education comprehension: verbalized understanding, returned demonstration, verbal cues required, tactile cues required, and needs further education     HOME EXERCISE PROGRAM: Access Code: [email or web address] URL: [email or web address] Date: 02/26/2024 Prepared by: Alm Jenny  Exercises - Sit to Stand with Armchair  - 1 x daily - 7 x weekly - 3 sets - 5 reps - Side Stepping with Counter Support  - 2-3 x daily - 7 x weekly - 1 sets - 10 reps - Standing Hip Extension with Counter Support  - 1 x daily - 7 x weekly - 3 sets - 10 reps - Tandem Stance with Support  - 1 x daily - 7 x weekly - 1 sets - 3 reps - 20-30 seconds hold - Forward Step Up with Counter Support  - 1 x daily - 7 x weekly - 2-3 sets - 6-8 reps - Standing Forward Step Taps with Counter Support  - 1 x daily - 7 x weekly - 2 sets - 8 reps    ASSESSMENT:  CLINICAL  IMPRESSION: 03/18/2024: ***  *** Pt arrives with no new complaints since last session. Today's visit shortened w/ late check in, but able to progress multiplanar reactive and anticipatory postural stability training using SPC. Does well with this, balance is challenged but no overt instability or LOB. Continues to demo reduced clearance on RLE, this improves after ambulation using ankle weight on limb. No adverse events, tolerates session well overall. Recommend continuing along current POC in order to address relevant deficits and improve functional tolerance. Pt departs today's session in no acute distress, all voiced questions/concerns addressed appropriately from PT perspective.    OBJECTIVE IMPAIRMENTS: Abnormal gait, decreased activity tolerance, difficulty walking, decreased strength, and impaired UE functional use.     GOALS: Goals reviewed with patient? Yes  SHORT TERM GOALS: Target date: 01/14/2024    Pt will be independent with initial HEP Baseline: 01/22/24: pt reports good HEP adherence Goal status: MET  2.  Pt will improve TUG to <= 24 seconds to demo improved functional mobility Baseline:  01/22/24: 21 sec w rollator   Goal status: MET   LONG TERM GOALS: Target date: 03/16/2024 (updated 02/03/24)***(updated 03/18/24)    Pt will be independent with advanced HEP including plan for community exercise Baseline:  02/03/24: HEP going well at home per pt report 03/18/24: *** Goal status: ***  2.  Pt will improve Berg balance test to >=30/56 to demo improving balance Baseline:  Goal status: MET  3.  Pt will improve 30 second chair rise test to >= 9 reps with UE support Baseline:  02/03/24: 6 reps w/ UE support (MET INITIAL GOAL of 6 reps) 03/18/24: *** Goal status: ***  4.  Pt will improve Rt LE strength to 4/5 to improve functional mobility Baseline:  02/03/24: see MMT chart above 03/18/24: *** Goal status: ***  5. Pt will be able to perform TUG w/ LRAD in less than or  equal to 16 sec in order to facilitate reduced fall risk (13.5sec cutoff score).  Baseline: 20.30sec w rollator  03/18/24: ***  Goal status: ***    PLAN: (updated 02/03/24)  PT FREQUENCY: 2x/week  PT DURATION: 6 weeks  PLANNED INTERVENTIONS: [id number]- PT Re-evaluation, 97110-Therapeutic exercises, 97530- Therapeutic activity, 97112- Neuromuscular re-education, 97535- Self Care, [id number]- Manual therapy, [id number]- Gait training, [phone]- Aquatic Therapy, Patient/Family education, Cryotherapy, and Moist heat  PLAN FOR NEXT SESSION: Gait and balance training, single limb, LAQ.   Alm DELENA Jenny PT, DPT 03/18/2024 8:56 AM    "

## 2024-04-01 ENCOUNTER — Ambulatory Visit: Payer: Medicare Other | Admitting: Neurology

## 2024-04-01 ENCOUNTER — Encounter: Payer: Self-pay | Admitting: Neurology

## 2024-04-01 VITALS — BP 123/80 | HR 70 | Ht 65.0 in | Wt 159.0 lb

## 2024-04-01 DIAGNOSIS — M25561 Pain in right knee: Secondary | ICD-10-CM | POA: Diagnosis not present

## 2024-04-01 DIAGNOSIS — M7551 Bursitis of right shoulder: Secondary | ICD-10-CM | POA: Diagnosis not present

## 2024-04-01 DIAGNOSIS — R202 Paresthesia of skin: Secondary | ICD-10-CM

## 2024-04-01 DIAGNOSIS — G35C1 Active secondary progressive multiple sclerosis: Secondary | ICD-10-CM

## 2024-04-01 DIAGNOSIS — R29898 Other symptoms and signs involving the musculoskeletal system: Secondary | ICD-10-CM

## 2024-04-01 DIAGNOSIS — G8929 Other chronic pain: Secondary | ICD-10-CM | POA: Diagnosis not present

## 2024-04-01 DIAGNOSIS — R200 Anesthesia of skin: Secondary | ICD-10-CM | POA: Diagnosis not present

## 2024-04-01 MED ORDER — BETAMETHASONE SOD PHOS & ACET 6 (3-3) MG/ML IJ SUSP
6.0000 mg | Freq: Once | INTRAMUSCULAR | Status: AC
  Administered 2024-04-01: 6 mg via INTRAMUSCULAR

## 2024-04-01 NOTE — Progress Notes (Signed)
 "  [facility]  PATIENT: Melinda Mckay DOB: 1946-01-30  REFERRING DOCTOR OR PCP: 05/21/2020 SOURCE: Patient, note from Dr. Ines, imaging and lab reports, MRI images personally reviewed.  _________________________________   HISTORICAL  CHIEF COMPLAINT:  Chief Complaint  Patient presents with   Follow-up    Room 10 With sister MS    HISTORY OF PRESENT ILLNESS:  Melinda Mckay is a 79 yo woman with  multiple sclerosis  Update 03/27/2023: She was diagnosed with MS 11/2022 after  CSF showed greater than 5 gamma restricted bands in the CSF that were not in the serum.  Additionally there were some additional bands present in both CSF and serum combined with her history and imaging studies this is consistent with multiple sclerosis.  We have discussed that given her age I do not think that we need to initiate a disease modifying therapy.  Currently, she is off balanced and uses walkers - she has a lightweight 3 wheeled one for the car and uses a better Rollator.   She has had a couple falls.   She has right sided weakness, leg > arm.   She has trouble lifting the right arm or right leg.    She has right sided intermittent numbness and the right hand is cold and tingling and she wears a glove.    Right hand has reduced use and she has difficulty with writing.   She sometimes notes cramps in her right leg - mostly in the mornings while still in bed.    She has urinary frequency and occasional urge incontinence and 2-3 times nocturia.   She started Myrbetriq 25 mg.  She has had some hesitancy and sometimes needs to run water to initiate.    Vision is doing well.    She has fatigue.    This sometimes occurs after a shower and other ties when she exercises (water is 82 degrees).     She does not note every day. If she rests a while, she feels better.     She notes restricted range of motion in the right shoulder.  Raising the arm or externally rotating is painful.  She is  unsure how much of her weakness in the shoulder is due to pain or actual weakness.  History of MS/neurologic symptoms: She was found on imaging studies to have multiple foci within the spinal cord worrisome for MS.   She also has foci in the brain but these have an appearance that is more typical for chronic microvascular ischemic change.    She had a TIA in 04/2020.   She had right sided tingling/numbness including her face.  She noted she could still walk and could talk but made her way back to her car.  She then drove to an urgent care.  Her BP was elevated and she was positive for Covid-19.  She was taken to [facility] ED.   MRI showed cahnges c/w SVID and she had a possible tiny spot on DWI.    Iitial symptoms lasted 5-10 minutes but then she had intermittent Numbness over the next couple weeks.   She noted balance issues later in 2022 and had MRI of the brain and cervical spine.   By my interpretation she had several foci in the cervical spinal cord c/w MS chronic demyelination though read as normal spinal cord ad multilevel DJD without critical stenosis.        Imaging: MRI of the cervical spine 07/07/2022 showed T2 hyperintense foci  posteriorly to the right adjacent to C2, to the right adjacent to C3 posteriorly adjacent to C3, posterolaterally to the right adjacent to C4, posterolaterally to the left adjacent to C5, towards the right adjacent to C6 and posterolaterally to the right adjacent to T1.  The T1 focus is not clearly seen on the previous MRI from 05/21/2020 while other foci seem unchanged.  There are multilevel degenerative changes noted.  There is mild spinal stenosis at C3-C4, C5-C6 and C6-C7.  There is foraminal narrowing that could affect the nerve roots bilaterally at C5-C6 and C6-C7 and to the left at C4-C5  MRI of the brain 09/02/2022 shows multiple T2/FLAIR hyperintense foci.  The vast majority are in the deep white matter with confluencies in the frontal lobes and the periatrial white  matter.  Foci also noted in the pons.  There are very few periventricular or juxtacortical foci.  Most of the changes are likely due to chronic microvascular ischemic disease rather than demyelination.  There is very slight progression compared to the MRI from 04/24/2020   REVIEW OF SYSTEMS: Constitutional: No fevers, chills, sweats, or change in appetite Eyes: No visual changes, double vision, eye pain Ear, nose and throat: No hearing loss, ear pain, nasal congestion, sore throat Cardiovascular: No chest pain, palpitations Respiratory:  No shortness of breath at rest or with exertion.   No wheezes GastrointestinaI: No nausea, vomiting, diarrhea, abdominal pain, fecal incontinence Genitourinary:  No dysuria, urinary retention or frequency.  No nocturia. Musculoskeletal:  No neck pain, back pain Integumentary: No rash, pruritus, skin lesions Neurological: as above Psychiatric: No depression at this time.  No anxiety Endocrine: No palpitations, diaphoresis, change in appetite, change in weigh or increased thirst Hematologic/Lymphatic:  No anemia, purpura, petechiae. Allergic/Immunologic: No itchy/runny eyes, nasal congestion, recent allergic reactions, rashes  ALLERGIES: No Known Allergies  HOME MEDICATIONS:  Current Outpatient Medications:    acetaminophen  (TYLENOL ) 500 MG tablet, Take 500 mg by mouth every 6 (six) hours as needed for mild pain., Disp: , Rfl:    albuterol  (PROAIR  HFA) 108 (90 Base) MCG/ACT inhaler, 2 puffs every 4 hours as needed only  if your can't catch your breath, Disp: 18 g, Rfl: 1   ALPRAZolam (XANAX) 1 MG tablet, Take 1 mg by mouth 2 (two) times daily as needed for anxiety., Disp: , Rfl:    amLODipine (NORVASC) 10 MG tablet, Take 5 mg by mouth daily., Disp: , Rfl:    beclomethasone (QVAR  REDIHALER) 80 MCG/ACT inhaler, Inhale 2 puffs into the lungs daily., Disp: 10.6 g, Rfl: 11   calcium -vitamin D (OSCAL WITH D) 500-200 MG-UNIT tablet, Take 1 tablet by mouth daily  with breakfast., Disp: , Rfl:    cholecalciferol (VITAMIN D3) 25 MCG (1000 UNIT) tablet, Take 1,000 Units by mouth daily., Disp: , Rfl:    clopidogrel  (PLAVIX ) 75 MG tablet, Take 1 tablet (75 mg total) by mouth daily., Disp: 30 tablet, Rfl: 0   Continuous Glucose Receiver (DEXCOM G6 RECEIVER) DEVI, by Does not apply route. Wears daily, Disp: , Rfl:    ezetimibe (ZETIA) 10 MG tablet, TAKE 1 TABLET BY MOUTH ONCE DAILY FOR 90 DAYS, Disp: , Rfl:    hydrochlorothiazide (HYDRODIURIL) 25 MG tablet, Take 25 mg by mouth daily., Disp: , Rfl:    hydrOXYzine  (ATARAX /VISTARIL ) 25 MG tablet, Take 25 mg by mouth at bedtime as needed for itching., Disp: , Rfl:    losartan (COZAAR) 100 MG tablet, Take 100 mg by mouth daily., Disp: , Rfl:  metFORMIN (GLUCOPHAGE-XR) 500 MG 24 hr tablet, Take 500 mg by mouth 2 (two) times daily., Disp: , Rfl:    MYRBETRIQ 25 MG TB24 tablet, Take 25 mg by mouth daily., Disp: , Rfl:    potassium chloride  SA (K-DUR,KLOR-CON ) 20 MEQ tablet, Take 20 mEq by mouth daily., Disp: , Rfl:    rosuvastatin  (CRESTOR ) 40 MG tablet, Take 40 mg by mouth daily., Disp: , Rfl:   PAST MEDICAL HISTORY: Past Medical History:  Diagnosis Date   Anxiety    Asthma    Diabetes mellitus without complication (HCC)    Edema of lower extremity    Hyperlipidemia    Hypertension    Multiple sclerosis    Prediabetes    Restless legs     PAST SURGICAL HISTORY: Past Surgical History:  Procedure Laterality Date   bone spur removal Right    BUNIONECTOMY Bilateral 2005   PARTIAL HYSTERECTOMY  2008   TUBAL LIGATION  1982    FAMILY HISTORY: Family History  Problem Relation Age of Onset   Cancer Sister        KIDNEY    Allergies Sister    Asthma Sister    Allergies Brother    Diabetes Brother    Hypertension Brother    Asthma Brother     SOCIAL HISTORY: Social History   Socioeconomic History   Marital status: Widowed    Spouse name: Not on file   Number of children: 2   Years of  education: Not on file   Highest education level: Bachelor's degree (e.g., BA, AB, BS)  Occupational History   Occupation: RETIRED   Tobacco Use   Smoking status: Former    Current packs/day: 0.00    Average packs/day: 0.4 packs/day for 20.0 years (8.0 ttl pk-yrs)    Types: Cigarettes    Start date: 03/11/1973    Quit date: 03/11/1993    Years since quitting: 31.0    Passive exposure: Never   Smokeless tobacco: Never   Tobacco comments:    PT STATES ABOUT 1 PACK A WEEK   Vaping Use   Vaping status: Never Used  Substance and Sexual Activity   Alcohol use: No    Comment: quit 2015   Drug use: No   Sexual activity: Not on file  Other Topics Concern   Not on file  Social History Narrative   Lives at home alone   Right handed   Drinks 1 cup of coffee daily   Social Drivers of Health   Tobacco Use: Medium Risk (04/01/2024)   Patient History    Smoking Tobacco Use: Former    Smokeless Tobacco Use: Never    Passive Exposure: Never  Physicist, Medical Strain: Low Risk  (12/29/2023)   Received from [facility]   Overall Financial Resource Strain (CARDIA)    Difficulty of Paying Living Expenses: Not very hard  Food Insecurity: No Food Insecurity (12/29/2023)   Received from [facility]   Epic    Within the past 12 months, you worried that your food would run out before you got the money to buy more.: Never true    Within the past 12 months, the food you bought just didn't last and you didn't have money to get more.: Never true  Transportation Needs: No Transportation Needs (12/29/2023)   Received from [facility] - [facility]    In the past 12 months, has lack of transportation kept you from medical  appointments or from getting medications?: No    Lack of Transportation (Non-Medical): No  Physical Activity: Not on file  Stress: Not on file  Social Connections: Unknown (04/05/2022)   [facility]    Social Network    Social Network: Not on file  Intimate Partner Violence: Unknown (04/05/2022)   Received from [facility]   HITS    Physically Hurt: Not on file    Insult or Talk Down To: Not on file    Threaten Physical Harm: Not on file    Scream or Curse: Not on file  Depression (PHQ2-9): Low Risk (03/21/2022)   Depression (PHQ2-9)    PHQ-2 Score: 0  Alcohol Screen: Not on file  Housing: Low Risk  (12/29/2023)   Received from [facility]   Epic    In the last 12 months, was there a time when you were not able to pay the mortgage or rent on time?: No    In the past 12 months, how many times have you moved where you were living?: 0    At any time in the past 12 months, were you homeless or living in a shelter (including now)?: No  Utilities: Not At Risk (12/29/2023)   Received from [facility]   Epic    In the past 12 months has the electric, gas, oil, or water company threatened to shut off services in your home?: No  Health Literacy: Not on file       PHYSICAL EXAM  Vitals:   04/01/24 1051  BP: 123/80  Pulse: 70  SpO2: 94%  Weight: 159 lb (72.1 kg)  Height: 5' 5 (1.651 m)     Body mass index is 26.46 kg/m.   General: The patient is well-developed and well-nourished and in no acute distress  HEENT:  Head is [place]/AT.  Sclera are anicteric.    Skin: Extremities are without rash or  edema.  Musculoskeletal:  Back is nontender.   Tender over the right subacromial bursa > AC joint.  Restricted range of motion due to pain  Neurologic Exam  Mental status: The patient is alert and oriented x 3 at the time of the examination. The patient has apparent normal recent and remote memory, with an apparently normal attention span and concentration ability.   Speech is normal.  Cranial nerves: Extraocular movements are full. Facial symmetry is present. Slight reduced lower face sensation to temperature.  .Facial strength is normal.   Trapezius and sternocleidomastoid strength is normal. No dysarthria is noted.  . No obvious hearing deficits are noted.  Motor:  Muscle bulk is normal.   Tone is normal. Strength is  5 / 5 on the left but 4+/5 strength in most right arm muscles, deltoid limited by pain.  In legs:   4-/5 right iliopsoas,  and right toe extension, 4+ quad/gastroc, 4/5 other right leg muscles . Left leg 5/5  Sensory: Sensory testing is intact to pinprick, soft touch and vibration sensation in all 4 extremities.  Coordination: Cerebellar testing reveals good finger-nose-finger and heel-to-shin bilaterally.  Gait and station: Station is normal.   Gait is arthritic and unbalanced and she has a mild right leg drop. Much better with walker. Romberg is negative.   Reflexes: Deep tendon reflexes are symmetric and normal in arms and increased at knees with spread, right more than left.   2 beats right ankle clonus.       DIAGNOSTIC DATA (LABS, IMAGING, TESTING) -  I reviewed patient records, labs, notes, testing and imaging myself where available.  Lab Results  Component Value Date   WBC 5.6 04/24/2020   HGB 12.7 04/24/2020   HCT 38.9 04/24/2020   MCV 87.8 04/24/2020   PLT 250 04/24/2020      Component Value Date/Time   NA 141 04/24/2020 0410   K 3.8 04/24/2020 0410   CL 104 04/24/2020 0410   CO2 28 04/24/2020 0410   GLUCOSE 113 (H) 04/24/2020 0410   BUN 16 04/24/2020 0410   CREATININE 0.72 04/24/2020 0410   CALCIUM  9.7 04/24/2020 0410   PROT 6.8 04/24/2020 0410   ALBUMIN 3.7 11/18/2022 1208   AST 19 04/24/2020 0410   ALT 15 04/24/2020 0410   ALKPHOS 54 04/24/2020 0410   BILITOT 0.6 04/24/2020 0410   GFRNONAA >60 04/24/2020 0410   GFRAA >60 02/28/2017 1340   Lab Results  Component Value Date   CHOL 171 04/24/2020   HDL 56 04/24/2020   LDLCALC 104 (H) 04/24/2020   TRIG 55 04/24/2020   CHOLHDL 3.1 04/24/2020   Lab Results  Component Value Date   HGBA1C 6.3 (H) 04/24/2020       ASSESSMENT  AND PLAN  Active secondary progressive multiple sclerosis  Subacromial bursitis of right shoulder joint  Paresthesia  Right leg weakness  Paresthesias  Right arm numbness  Chronic pain of right knee   Although presentation at a late age is atypical, MRI of the cervical spine is very consistent with multiple sclerosis with several T2 hyperintense foci not adjacent to disc pathology.  She has no more than mild spinal stenosis.  Additionally CSF showed oligoclonal bands that were not present in the serum consistent with intrathecal synthesis..  Likely had a spinal relapse in 2022.  Complicating her picture, MRI of the brain shows multiple T2/FLAIR hyperintense foci that are actually more consistent with chronic microvascular ischemic changes.  Because of her age we will hold off on a disease modifying therapy.  However, if another exacerbation occurs, recommend we start a DMT.  right subacromial bursa injection with 6 mg Celestone  in 2.5 CC Marcaine using sterile technique.   She tolerated the procedure well and there were no complications.  Pain and ROM much better a few minutes later. Stable return to see me in 12 months or sooner if there are new or worsening neurologic symptoms.  This visit is part of a comprehensive longitudinal care medical relationship regarding the patients primary diagnosis of MS and related concerns.    Dekisha Mesmer A. Vear, MD, Teola RENO 04/01/2024, 2:05 PM Certified in Neurology, Clinical Neurophysiology, Sleep Medicine and Neuroimaging  [facility] [address] [place], [place] [address] [phone] "

## 2025-04-04 ENCOUNTER — Ambulatory Visit: Admitting: Neurology
